# Patient Record
Sex: Male | Born: 1970 | Race: White | Hispanic: No | Marital: Married | Smoking: Never smoker
Health system: Southern US, Community
[De-identification: ages and names within clinical notes are randomized; demographics above are authoritative.]

## PROBLEM LIST (undated history)

## (undated) DIAGNOSIS — F329 Major depressive disorder, single episode, unspecified: Secondary | ICD-10-CM

## (undated) DIAGNOSIS — F411 Generalized anxiety disorder: Secondary | ICD-10-CM

## (undated) DIAGNOSIS — I10 Essential (primary) hypertension: Secondary | ICD-10-CM

## (undated) DIAGNOSIS — E669 Obesity, unspecified: Secondary | ICD-10-CM

## (undated) DIAGNOSIS — E785 Hyperlipidemia, unspecified: Secondary | ICD-10-CM

## (undated) HISTORY — DX: Hyperlipidemia, unspecified: E78.5

## (undated) HISTORY — PX: OTHER SURGICAL HISTORY: SHX169

## (undated) HISTORY — PX: HERNIA REPAIR: SHX51

## (undated) HISTORY — DX: Major depressive disorder, single episode, unspecified: F32.9

## (undated) HISTORY — DX: Generalized anxiety disorder: F41.1

## (undated) HISTORY — DX: Obesity, unspecified: E66.9

## (undated) HISTORY — PX: LITHOTRIPSY: SUR834

## (undated) HISTORY — DX: Essential (primary) hypertension: I10

---

## 1999-11-19 ENCOUNTER — Encounter: Payer: Self-pay | Admitting: Internal Medicine

## 1999-11-19 ENCOUNTER — Ambulatory Visit (HOSPITAL_COMMUNITY): Admission: RE | Admit: 1999-11-19 | Discharge: 1999-11-19 | Payer: Self-pay | Admitting: Internal Medicine

## 1999-11-20 ENCOUNTER — Encounter: Payer: Self-pay | Admitting: Internal Medicine

## 1999-11-20 ENCOUNTER — Ambulatory Visit (HOSPITAL_COMMUNITY): Admission: RE | Admit: 1999-11-20 | Discharge: 1999-11-20 | Payer: Self-pay | Admitting: Internal Medicine

## 2002-10-05 ENCOUNTER — Other Ambulatory Visit: Admission: RE | Admit: 2002-10-05 | Discharge: 2002-10-05 | Payer: Self-pay | Admitting: Dermatology

## 2003-10-19 ENCOUNTER — Ambulatory Visit (HOSPITAL_BASED_OUTPATIENT_CLINIC_OR_DEPARTMENT_OTHER): Admission: RE | Admit: 2003-10-19 | Discharge: 2003-10-19 | Payer: Self-pay | Admitting: Orthopedic Surgery

## 2004-06-05 ENCOUNTER — Ambulatory Visit: Payer: Self-pay | Admitting: Internal Medicine

## 2004-09-19 ENCOUNTER — Ambulatory Visit: Payer: Self-pay | Admitting: Internal Medicine

## 2004-09-29 ENCOUNTER — Ambulatory Visit: Payer: Self-pay | Admitting: Internal Medicine

## 2005-04-22 ENCOUNTER — Ambulatory Visit: Payer: Self-pay | Admitting: Internal Medicine

## 2005-05-11 ENCOUNTER — Ambulatory Visit: Payer: Self-pay | Admitting: Internal Medicine

## 2005-08-09 ENCOUNTER — Emergency Department (HOSPITAL_COMMUNITY): Admission: EM | Admit: 2005-08-09 | Discharge: 2005-08-09 | Payer: Self-pay | Admitting: Emergency Medicine

## 2007-03-31 ENCOUNTER — Encounter: Payer: Self-pay | Admitting: Internal Medicine

## 2007-03-31 ENCOUNTER — Ambulatory Visit: Payer: Self-pay | Admitting: Internal Medicine

## 2007-03-31 DIAGNOSIS — F3289 Other specified depressive episodes: Secondary | ICD-10-CM

## 2007-03-31 DIAGNOSIS — E785 Hyperlipidemia, unspecified: Secondary | ICD-10-CM | POA: Insufficient documentation

## 2007-03-31 DIAGNOSIS — Z9189 Other specified personal risk factors, not elsewhere classified: Secondary | ICD-10-CM | POA: Insufficient documentation

## 2007-03-31 DIAGNOSIS — F32A Depression, unspecified: Secondary | ICD-10-CM | POA: Insufficient documentation

## 2007-03-31 DIAGNOSIS — F329 Major depressive disorder, single episode, unspecified: Secondary | ICD-10-CM

## 2007-03-31 DIAGNOSIS — G4726 Circadian rhythm sleep disorder, shift work type: Secondary | ICD-10-CM

## 2007-03-31 DIAGNOSIS — R5383 Other fatigue: Secondary | ICD-10-CM

## 2007-03-31 DIAGNOSIS — R5381 Other malaise: Secondary | ICD-10-CM | POA: Insufficient documentation

## 2007-03-31 HISTORY — DX: Other specified depressive episodes: F32.89

## 2007-03-31 HISTORY — DX: Hyperlipidemia, unspecified: E78.5

## 2007-03-31 HISTORY — DX: Major depressive disorder, single episode, unspecified: F32.9

## 2008-04-10 ENCOUNTER — Ambulatory Visit: Payer: Self-pay | Admitting: Endocrinology

## 2008-04-10 DIAGNOSIS — I10 Essential (primary) hypertension: Secondary | ICD-10-CM

## 2008-04-10 DIAGNOSIS — R519 Headache, unspecified: Secondary | ICD-10-CM | POA: Insufficient documentation

## 2008-04-10 DIAGNOSIS — R51 Headache: Secondary | ICD-10-CM

## 2008-04-10 HISTORY — DX: Essential (primary) hypertension: I10

## 2008-04-17 ENCOUNTER — Ambulatory Visit: Payer: Self-pay | Admitting: Internal Medicine

## 2008-04-17 DIAGNOSIS — E663 Overweight: Secondary | ICD-10-CM | POA: Insufficient documentation

## 2008-04-17 LAB — CONVERTED CEMR LAB
BUN: 14 mg/dL (ref 6–23)
Calcium: 9.3 mg/dL (ref 8.4–10.5)
Creatinine, Ser: 0.7 mg/dL (ref 0.4–1.5)
GFR calc Af Amer: 163 mL/min
GFR calc non Af Amer: 135 mL/min
Glucose, Bld: 106 mg/dL — ABNORMAL HIGH (ref 70–99)
HDL: 34.6 mg/dL — ABNORMAL LOW (ref >39.0)
Potassium: 3.9 meq/L (ref 3.5–5.1)
Total CHOL/HDL Ratio: 6.3
Triglycerides: 125 mg/dL (ref 0–149)
VLDL: 25 mg/dL (ref 0–40)
Vitamin B-12: 361 pg/mL (ref 211–911)

## 2008-04-19 ENCOUNTER — Encounter: Payer: Self-pay | Admitting: Internal Medicine

## 2008-05-07 ENCOUNTER — Encounter: Payer: Self-pay | Admitting: Internal Medicine

## 2008-05-07 ENCOUNTER — Ambulatory Visit (HOSPITAL_COMMUNITY): Admission: RE | Admit: 2008-05-07 | Discharge: 2008-05-07 | Payer: Self-pay | Admitting: Preventative Medicine

## 2008-05-09 ENCOUNTER — Ambulatory Visit: Payer: Self-pay | Admitting: Internal Medicine

## 2008-05-09 DIAGNOSIS — J984 Other disorders of lung: Secondary | ICD-10-CM | POA: Insufficient documentation

## 2008-05-09 DIAGNOSIS — D3500 Benign neoplasm of unspecified adrenal gland: Secondary | ICD-10-CM | POA: Insufficient documentation

## 2008-05-15 ENCOUNTER — Encounter: Admission: RE | Admit: 2008-05-15 | Discharge: 2008-05-15 | Payer: Self-pay | Admitting: Internal Medicine

## 2008-05-21 ENCOUNTER — Encounter: Payer: Self-pay | Admitting: Internal Medicine

## 2008-06-12 ENCOUNTER — Telehealth (INDEPENDENT_AMBULATORY_CARE_PROVIDER_SITE_OTHER): Payer: Self-pay | Admitting: *Deleted

## 2008-06-13 ENCOUNTER — Ambulatory Visit: Payer: Self-pay | Admitting: Internal Medicine

## 2008-06-13 DIAGNOSIS — R05 Cough: Secondary | ICD-10-CM | POA: Insufficient documentation

## 2008-07-09 ENCOUNTER — Telehealth: Payer: Self-pay | Admitting: Internal Medicine

## 2008-07-10 ENCOUNTER — Ambulatory Visit: Payer: Self-pay | Admitting: Internal Medicine

## 2008-07-10 DIAGNOSIS — J069 Acute upper respiratory infection, unspecified: Secondary | ICD-10-CM | POA: Insufficient documentation

## 2008-07-11 ENCOUNTER — Telehealth: Payer: Self-pay | Admitting: Internal Medicine

## 2008-07-12 ENCOUNTER — Encounter: Payer: Self-pay | Admitting: Internal Medicine

## 2008-07-17 ENCOUNTER — Ambulatory Visit: Payer: Self-pay | Admitting: Internal Medicine

## 2008-07-23 ENCOUNTER — Telehealth: Payer: Self-pay | Admitting: Internal Medicine

## 2008-08-01 ENCOUNTER — Encounter: Payer: Self-pay | Admitting: Internal Medicine

## 2008-11-20 ENCOUNTER — Encounter
Admission: RE | Admit: 2008-11-20 | Discharge: 2008-11-20 | Payer: Self-pay | Admitting: Physical Medicine and Rehabilitation

## 2008-11-20 ENCOUNTER — Encounter: Payer: Self-pay | Admitting: Internal Medicine

## 2008-12-10 ENCOUNTER — Telehealth: Payer: Self-pay | Admitting: Internal Medicine

## 2009-01-14 ENCOUNTER — Encounter: Payer: Self-pay | Admitting: Internal Medicine

## 2009-01-17 ENCOUNTER — Ambulatory Visit: Payer: Self-pay | Admitting: Internal Medicine

## 2009-01-17 ENCOUNTER — Telehealth: Payer: Self-pay | Admitting: Internal Medicine

## 2009-01-21 ENCOUNTER — Telehealth: Payer: Self-pay | Admitting: Internal Medicine

## 2009-02-19 ENCOUNTER — Ambulatory Visit: Payer: Self-pay | Admitting: Internal Medicine

## 2009-02-19 DIAGNOSIS — M25569 Pain in unspecified knee: Secondary | ICD-10-CM | POA: Insufficient documentation

## 2009-02-19 LAB — CONVERTED CEMR LAB
CO2: 29 meq/L (ref 19–32)
Chloride: 102 meq/L (ref 96–112)
Direct LDL: 153.4 mg/dL
Sodium: 139 meq/L (ref 135–145)
Total CHOL/HDL Ratio: 7
VLDL: 24.8 mg/dL (ref 0.0–40.0)

## 2009-02-21 ENCOUNTER — Encounter: Payer: Self-pay | Admitting: Internal Medicine

## 2009-02-23 ENCOUNTER — Encounter: Payer: Self-pay | Admitting: Internal Medicine

## 2009-02-26 ENCOUNTER — Encounter: Payer: Self-pay | Admitting: Internal Medicine

## 2009-02-27 ENCOUNTER — Telehealth: Payer: Self-pay | Admitting: Internal Medicine

## 2009-03-01 ENCOUNTER — Telehealth: Payer: Self-pay | Admitting: Internal Medicine

## 2009-03-05 ENCOUNTER — Telehealth: Payer: Self-pay | Admitting: Internal Medicine

## 2009-04-01 ENCOUNTER — Telehealth: Payer: Self-pay | Admitting: Internal Medicine

## 2009-04-02 ENCOUNTER — Telehealth: Payer: Self-pay | Admitting: Internal Medicine

## 2009-04-03 ENCOUNTER — Ambulatory Visit: Payer: Self-pay | Admitting: Internal Medicine

## 2009-04-03 ENCOUNTER — Encounter: Admission: RE | Admit: 2009-04-03 | Discharge: 2009-04-03 | Payer: Self-pay | Admitting: Internal Medicine

## 2009-04-03 LAB — CONVERTED CEMR LAB
ALT: 34 units/L (ref 0–53)
Albumin: 4 g/dL (ref 3.5–5.2)
Alkaline Phosphatase: 68 units/L (ref 39–117)
Bilirubin, Direct: 0.1 mg/dL (ref 0.0–0.3)
Cholesterol: 166 mg/dL (ref 0–200)
LDL Cholesterol: 112 mg/dL — ABNORMAL HIGH (ref 0–99)
Total Protein: 7.2 g/dL (ref 6.0–8.3)
Triglycerides: 113 mg/dL (ref 0.0–149.0)
VLDL: 22.6 mg/dL (ref 0.0–40.0)

## 2009-04-04 ENCOUNTER — Telehealth: Payer: Self-pay | Admitting: Internal Medicine

## 2009-04-08 ENCOUNTER — Telehealth: Payer: Self-pay | Admitting: Internal Medicine

## 2009-09-10 ENCOUNTER — Encounter (INDEPENDENT_AMBULATORY_CARE_PROVIDER_SITE_OTHER): Payer: Self-pay | Admitting: *Deleted

## 2009-09-10 ENCOUNTER — Ambulatory Visit: Payer: Self-pay | Admitting: Internal Medicine

## 2009-09-10 DIAGNOSIS — R109 Unspecified abdominal pain: Secondary | ICD-10-CM

## 2009-09-13 ENCOUNTER — Ambulatory Visit: Payer: Self-pay | Admitting: Internal Medicine

## 2010-03-01 ENCOUNTER — Emergency Department (HOSPITAL_COMMUNITY)
Admission: EM | Admit: 2010-03-01 | Discharge: 2010-03-01 | Payer: Self-pay | Source: Home / Self Care | Admitting: Family Medicine

## 2010-05-23 ENCOUNTER — Ambulatory Visit: Payer: Self-pay | Admitting: Internal Medicine

## 2010-05-23 ENCOUNTER — Encounter: Payer: Self-pay | Admitting: Internal Medicine

## 2010-05-23 DIAGNOSIS — M2550 Pain in unspecified joint: Secondary | ICD-10-CM

## 2010-05-23 LAB — CONVERTED CEMR LAB
ANA Titer 1: NEGATIVE
Anti Nuclear Antibody(ANA): POSITIVE — AB
Basophils Absolute: 0 10*3/uL (ref 0.0–0.1)
CO2: 30 meq/L (ref 19–32)
CRP, High Sensitivity: 1.79 (ref 0.00–5.00)
Chloride: 104 meq/L (ref 96–112)
Eosinophils Absolute: 0.1 10*3/uL (ref 0.0–0.7)
Hemoglobin: 14.2 g/dL (ref 13.0–17.0)
Lymphocytes Relative: 35.6 % (ref 12.0–46.0)
MCHC: 34.3 g/dL (ref 30.0–36.0)
Monocytes Relative: 9.9 % (ref 3.0–12.0)
Neutro Abs: 4.3 10*3/uL (ref 1.4–7.7)
Neutrophils Relative %: 52.6 % (ref 43.0–77.0)
Potassium: 4.1 meq/L (ref 3.5–5.1)
RBC: 4.65 M/uL (ref 4.22–5.81)
RDW: 13.6 % (ref 11.5–14.6)
Rhuematoid fact SerPl-aCnc: <20 intl units/mL (ref 0–20)
Sed Rate: 13 mm/hr (ref 0–22)

## 2010-05-26 ENCOUNTER — Telehealth: Payer: Self-pay | Admitting: Internal Medicine

## 2010-06-10 ENCOUNTER — Telehealth: Payer: Self-pay | Admitting: Internal Medicine

## 2010-07-15 NOTE — Assessment & Plan Note (Signed)
Summary: PULLED MUSCLE BACK ADD TO ANY MD PER FLAG/CD   Vital Signs:  Patient profile:   40 year old male Height:      75 inches Weight:      259.38 pounds BMI:     32.54 O2 Sat:      98 % on Room air Temp:     98.1 degrees F oral Pulse rate:   93 / minute BP sitting:   102 / 80  (left arm) Cuff size:   regular  Vitals Entered ByZella Ball Ewing (September 10, 2009 2:47 PM)  O2 Flow:  Room air CC: pulled muscle in back/RE   Primary Care Provider:  Illene Regulus, MD  CC:  pulled muscle in back/RE.  History of Present Illness: here with simple severe right lower back strain pain after lifitng heavy object 2 days ago, thought it would get better, but jsut keeps getting worse it seems in severity, located to the right lumbar area, non radicular although he has had right sciatica pain in the past;  no fever, wt loss, night sweats, radicular pain, bowel or bladder changes, LE pain, weakness or numbness, gait problem or falls or other injury.   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 1 By Mouth At Bedtime As Needed 2)  Sumatriptan Succinate 100 Mg Tabs (Sumatriptan Succinate) .... As Needed Headache Nte 2/day 3)  Diovan Hct 160-12.5 Mg Tabs  (Valsartan-Hydrochlorothiazide) .Marland Kitchen.. 1 By Mouth Once Daily 4)  Nexium 40 Mg  Cpdr (Esomeprazole Magnesium) .... By Mouth Daily. Take One Half Hour Before Eating. 5)  Lovastatin 40 Mg Tabs (Lovastatin) .Marland Kitchen.. 1 Once Daily  Current Medications (verified): 1)  Ambien Cr 12.5 Mg Cr-Tabs (Zolpidem Tartrate) .Marland Kitchen.. 1 By Mouth At Bedtime As Needed 2)  Sumatriptan Succinate 100 Mg Tabs (Sumatriptan Succinate) .... As Needed Headache Nte 2/day 3)  Diovan Hct 160-12.5 Mg Tabs (Valsartan-Hydrochlorothiazide) .Marland Kitchen.. 1 By Mouth Once Daily 4)  Nexium 40 Mg  Cpdr (Esomeprazole Magnesium) .... By Mouth Daily. Take One Half Hour Before Eating. 5)  Lovastatin 40 Mg Tabs (Lovastatin) .Marland Kitchen.. 1 Once Daily 6)  Oxycodone Hcl 5 Mg Tabs (Oxycodone Hcl) .Marland Kitchen.. 1 By Mouth Q 6 Hrs As Needed 7)  Flexeril 5 Mg Tabs (Cyclobenzaprine Hcl) .Marland Kitchen.. 1 By Mouth Three Times A Day  Allergies (verified): 1)  ! 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 Never smoker  Risk Factors: Alcohol Use: <1 (04/17/2008) Caffeine  Use: 5+ (04/17/2008) Exercise: no (04/17/2008)  Risk Factors: Smoking Status: never (03/31/2007)  Review of Systems       all otherwise negative per pt -   except pt requests refill on teh ambien cr as this has done well for his sleep disorder  Physical Exam  General:  alert and overweight-appearing.   Head:  normocephalic and atraumatic.   Eyes:  vision grossly intact, pupils equal, and pupils round.   Ears:  R ear normal and L ear normal.   Nose:  no external deformity and no nasal discharge.   Mouth:  no  gingival abnormalities and pharynx pink and moist.   Neck:  supple and no masses.   Lungs:  normal respiratory effort and normal breath sounds.   Heart:  normal rate and regular rhythm.   Msk:  mod to severe right lumbar paravertebral tender spasm Extremities:  no edema, no erythema  Neurologic:  cranial nerves II-XII intact, strength normal in all extremities, and sensation intact to light touch.   Skin:  no rashes.   Psych:  not anxious appearing and not depressed appearing.     Impression & Recommendations:  Problem # 1:  BACK PAIN (ICD-724.5)  has an element of right sciatic stable, today with severe right lumbar paravertebral MSK strain ; off work note given, treat as above, f/u any worsening signs or symptoms   His updated medication list for this problem includes:    Oxycodone Hcl 5 Mg Tabs (Oxycodone hcl) .Marland Kitchen... 1 by mouth q 6 hrs as needed    Flexeril 5 Mg Tabs (Cyclobenzaprine hcl) .Marland Kitchen... 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 1 by mouth once daily  BP today: 102/80 Prior BP: 114/76 (02/19/2009)  Labs Reviewed: K+: 3.6 (02/19/2009) Creat: : 0.8 (02/19/2009)   Chol: 166 (04/03/2009)   HDL: 31.90 (04/03/2009)   LDL: 112 (04/03/2009)   TG: 113.0 (04/03/2009) stable overall by hx and exam, ok to continue meds/tx as is   Complete Medication List: 1)  Ambien Cr 12.5 Mg Cr-tabs (Zolpidem tartrate) .Marland Kitchen.. 1 by mouth at bedtime as needed 2)  Sumatriptan Succinate 100 Mg Tabs (Sumatriptan succinate) .... As needed headache nte 2/day 3)  Diovan Hct 160-12.5 Mg Tabs (Valsartan-hydrochlorothiazide) .Marland Kitchen.. 1 by mouth once daily 4)  Nexium 40 Mg Cpdr (Esomeprazole magnesium) .... By mouth daily. take one half hour before eating. 5)   Lovastatin 40 Mg Tabs (Lovastatin) .Marland Kitchen.. 1 once daily 6)  Oxycodone Hcl 5 Mg Tabs (Oxycodone hcl) .Marland Kitchen.. 1 by mouth q 6 hrs as needed 7)  Flexeril 5 Mg Tabs (Cyclobenzaprine hcl) .Marland Kitchen.. 1 by mouth three times a day  Patient Instructions: 1)  Please take all new medications as prescribed 2)  Continue all previous medications as before this visit  3)  you are given the work note 4)  Please schedule an appointment with your primary doctor as needed Prescriptions: FLEXERIL 5 MG TABS (CYCLOBENZAPRINE HCL) 1 by mouth three times a day  #60 x 0   Entered and Authorized by:   Corwin Levins MD   Signed by:   Corwin Levins MD on 09/10/2009   Method used:   Print then Give to Patient   RxID:  [id number] OXYCODONE HCL 5 MG TABS (OXYCODONE HCL) 1 by mouth q 6 hrs as needed  #40 x 0   Entered and Authorized by:   Corwin Levins MD   Signed by:   Corwin Levins MD on 09/10/2009   Method used:   Print then Give to Patient   RxID:   [id number] AMBIEN CR 12.5 MG CR-TABS (ZOLPIDEM TARTRATE) 1 by mouth at bedtime as needed  #30 x 5   Entered and Authorized by:   Corwin Levins MD   Signed by:   Corwin Levins MD on 09/10/2009   Method used:   Print then Give to Patient   RxID:   [id number]

## 2010-07-15 NOTE — Letter (Signed)
Summary: Out of Work  [facility]  [address] [place], [place] [address]   Phone: [phone]  Fax: [phone]    September 10, 2009   Employee:  MARTEZ WEIAND Dyck    To Whom It May Concern:   For Medical reasons, please excuse the above named employee from work for the following dates:  Start:   09/10/2009  End:   09/17/2009  If you need additional information, please feel free to contact our office.         Sincerely,    Dr. Oliver Barre

## 2010-07-17 NOTE — Assessment & Plan Note (Signed)
Summary: BONE AND MUSCLE PAIN FOR A COUPLE OF MO/NWS  #   Vital Signs:  Patient profile:   40 year old male Height:      75 inches Weight:      257 pounds BMI:     32.24 O2 Sat:      97 % on Room air Temp:     97.4 degrees F oral Pulse rate:   82 / minute BP sitting:   138 / 90  (left arm) Cuff size:   regular  Vitals Entered By: Bill Salinas CMA (May 23, 2010 12:28 PM)  O2 Flow:  Room air CC: pt here with c/o joint pain all over x about 2 months, pt needs refill on lovastatin/ ab   Primary Care Jasmin Winberry:  Illene Regulus, MD  CC:  pt here with c/o joint pain all over x about 2 months and pt needs refill on lovastatin/ ab.  History of Present Illness: Patient presents with a several month history of progressive multi-joint pain that is limiting his activities. He has pain in the hips, wrists, hands. He has had some stiffnes of the hands but no distinct swelling. He has no prior h/o arthirits. He has not had any recent medical illness. He denies redness or swelling about the joints. He has not had an overuse or strain. His pain and discomfort is constant and is starting to limit his acitvities. He has had no fever, chills or other systemic symptoms.   Current Medications (verified): 1)  Ambien Cr 12.5 Mg Cr-Tabs (Zolpidem Tartrate) .Marland Kitchen.. 1 By Mouth At Bedtime As Needed 2)  Sumatriptan Succinate 100 Mg Tabs (Sumatriptan Succinate) .... As Needed Headache Nte 2/day 3)  Diovan Hct 160-12.5 Mg Tabs (Valsartan-Hydrochlorothiazide) .Marland Kitchen.. 1 By Mouth Once Daily 4)  Nexium 40 Mg  Cpdr (Esomeprazole Magnesium) .... By Mouth Daily. Take One Half Hour Before Eating. 5)  Lovastatin 40 Mg Tabs (Lovastatin) .Marland Kitchen.. 1 Once Daily 6)  Oxycodone Hcl 5 Mg Tabs (Oxycodone Hcl) .Marland Kitchen.. 1 By Mouth Q 6 Hrs As Needed 7)  Flexeril 5 Mg Tabs (Cyclobenzaprine Hcl) .Marland Kitchen.. 1 By Mouth Three Times A Day  Allergies (verified): 1)  ! 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    Physical Exam  General:  Well-developed,well-nourished,in no acute distress; alert,appropriate and cooperative throughout examination Head:  Normocephalic and atraumatic without obvious abnormalities. No apparent alopecia or balding. Eyes:  C&S clear Lungs:  normal respiratory effort.   Heart:  normal rate and regular rhythm.   Msk:  joint survey: normal hands, wrists, elbows, shoulders and hips in regard to range of motion NO synovial thickening, no loss ROM, no redness or heat about any of the joint . Pulses:  2+ radial Neurologic:  alert & oriented X3, cranial nerves II-XII intact, strength normal in all extremities, and gait normal.  Skin:  turgor normal, color normal, and no suspicious lesions.   Cervical Nodes:  no anterior cervical adenopathy and no posterior cervical adenopathy.   Psych:  Oriented X3, normally interactive, good eye contact, and not anxious appearing.     Impression & Recommendations:  Problem # 1:  PAIN IN JOINT, MULTIPLE SITES (ICD-719.49)  History of difuse and progressive arthralgias with a normal exam of small, medium and large  joints.  Plan - albs to r/o rheumatologic disease - ANA, ESR, CRP etc with recommendations to follow.   addendum: lab results are negative.  Plan - refer to Dr. Nickola Major  Orders: Rheumatology Referral (Rheumatology)  Complete Medication List: 1)  Ambien Cr 12.5 Mg Cr-tabs (Zolpidem tartrate) .Marland Kitchen.. 1 by mouth at bedtime as needed 2)  Sumatriptan Succinate 100 Mg Tabs (Sumatriptan succinate) .... As needed headache nte 2/day 3)  Diovan Hct 160-12.5 Mg Tabs (Valsartan-hydrochlorothiazide) .Marland Kitchen.. 1 by mouth once daily 4)  Nexium 40 Mg Cpdr (Esomeprazole magnesium) .... By mouth daily. take one half hour before eating. 5)  Lovastatin 40 Mg Tabs (Lovastatin) .Marland Kitchen.. 1 once daily 6)  Oxycodone Hcl 5 Mg Tabs (Oxycodone hcl) .Marland Kitchen.. 1 by mouth q 6 hrs as needed 7)  Flexeril 5 Mg Tabs (Cyclobenzaprine hcl) .Marland Kitchen.. 1 by mouth three times a day  Other Orders: TLB-CBC Platelet - w/Differential (85025-CBCD) TLB-BMP (Basic Metabolic Panel-BMET) (80048-METABOL) TLB-Sedimentation Rate (ESR) (85652-ESR) TLB-CRP-High Sensitivity (C-Reactive Protein) (86140-FCRP) T-Rheumatoid Factor ([address]) T-Antinuclear Antib (ANA) ([address]-91478)  Patient: Robert Lloyd Note: All result statuses are Final unless otherwise noted.  Tests: (1) CBC Platelet w/Diff (CBCD)   White Cell Count          8.2 K/uL                    4.5-10.5   Red Cell Count            4.65 Mil/uL                 4.22-5.81   Hemoglobin                14.2 g/dL                   29.5-62.1   Hematocrit                41.4 %                      39.0-52.0   MCV                       89.0 fl                     78.0-100.0   MCHC                      34.3 g/dL                   30.8-65.7   RDW                       13.6 %                      11.5-14.6   Platelet Count            246.0 K/uL  150.0-400.0   Neutrophil %              52.6 %                      43.0-77.0   Lymphocyte %              35.6 %                      12.0-46.0    Monocyte %                9.9 %                       3.0-12.0   Eosinophils%              1.3 %                       0.0-5.0   Basophils %               0.6 %                       0.0-3.0   Neutrophill Absolute      4.3 K/uL                    1.4-7.7   Lymphocyte Absolute       2.9 K/uL                    0.7-4.0   Monocyte Absolute         0.8 K/uL                    0.1-1.0  Eosinophils, Absolute                             0.1 K/uL                    0.0-0.7   Basophils Absolute        0.0 K/uL                    0.0-0.1  Tests: (2) BMP (METABOL)   Sodium                    141 mEq/L                   135-145   Potassium                 4.1 mEq/L                   3.5-5.1   Chloride                  104 mEq/L                   96-112   Carbon Dioxide            30 mEq/L                    19-32   Glucose                   98 mg/dL  70-99   BUN                       16 mg/dL                    0-45   Creatinine                0.8 mg/dL                   4.0-9.8   Calcium                   9.2 mg/dL                   1.1-91.4   GFR                       119.12 mL/min               >60.00  Tests: (3) Sed Rate (ESR)   Sed Rate                  13 mm/hr                    0-22  Tests: (4) Full Range CRP (FCRP)   CRPH                      1.79 mg/L                   0.00-5.00 Tests: (1) Rheumatoid (RA) Factor (78295)  Rheumatoid (RA) Factor                             < 20 IU/mL                  0-20  Tests: (2) Anti Nuclear Antibody (ANA) Reflex (23900)  Anti Nuclear Antibody (ANA)                             <No Reported Value>         NEGATIVE  Orders Added: 1)  TLB-CBC Platelet - w/Differential [85025-CBCD] 2)  TLB-BMP (Basic Metabolic Panel-BMET) [80048-METABOL] 3)  TLB-Sedimentation Rate (ESR) [85652-ESR] 4)  TLB-CRP-High Sensitivity (C-Reactive Protein) [86140-FCRP] 5)  T-Rheumatoid Factor [62130-86578] 6)  T-Antinuclear Antib (ANA) [46962-95284] 7)   Est. Patient Level III [13244] 8)  Rheumatology Referral [Rheumatology]

## 2010-07-17 NOTE — Progress Notes (Signed)
Summary: RESULTS  Phone Note Outgoing Call   Reason for Call: Discuss lab or test results Summary of Call: Please call patient: all labs were normal. Will refer to Dr. Nickola Major at [facility] for rheumatology consult.  Initial call taken by: Jacques Navy MD,  May 26, 2010 6:34 AM  Follow-up for Phone Call        Pt left vm - req to know results and why he was referred to Rheumatology. [phone] Follow-up by: Lamar Sprinkles, CMA,  May 26, 2010 11:29 AM  Additional Follow-up for Phone Call Additional follow up Details #1::        Spoke w/MD about positive ANA, seems titer is not back. Pt need referral due to ? about why he is in pain. Attempted to call, NO answer. Additional Follow-up by: Lamar Sprinkles, CMA,  May 26, 2010 2:43 PM    Additional Follow-up for Phone Call Additional follow up Details #2::    Results avail -neg ANA titer, left mess to call office back..................Marland KitchenLamar Sprinkles, CMA  May 27, 2010 3:43 PM   Pt informed  Follow-up by: Lamar Sprinkles, CMA,  May 29, 2010 6:09 PM

## 2010-07-17 NOTE — Progress Notes (Signed)
Summary: REQ FOR RX  Phone Note Call from Patient   Summary of Call: Pt has been taking 4 advil two times a day to help w/pain. Pt see rheumatologist in January. He is req rx for to help w/pain as advil has not helped.  Initial call taken by: Lamar Sprinkles, CMA,  June 10, 2010 2:21 PM  Follow-up for Phone Call        tramadol 50mg  2 tabs q 8 hours for pain, #180, refill x 1. May continue ibuprofen with this.  Follow-up by: Jacques Navy MD,  June 10, 2010 5:58 PM  Additional Follow-up for Phone Call Additional follow up Details #1::        Pt informed  Additional Follow-up by: Lamar Sprinkles, CMA,  June 10, 2010 6:07 PM    New/Updated Medications: TRAMADOL HCL 50 MG TABS (TRAMADOL HCL) 1 to 2 tabs every 8 hours as needed pain Prescriptions: TRAMADOL HCL 50 MG TABS (TRAMADOL HCL) 1 to 2 tabs every 8 hours as needed pain  #180 x 1   Entered by:   Lamar Sprinkles, CMA   Authorized by:   Jacques Navy MD   Signed by:   Lamar Sprinkles, CMA on 06/10/2010   Method used:   Electronically to        [phone][facility] (retail)       [address]       [place], [place]  [address]       Ph: [phone] or [phone]       Fax: [phone]   RxID:   [id number]

## 2010-08-01 ENCOUNTER — Telehealth: Payer: Self-pay | Admitting: Internal Medicine

## 2010-08-06 NOTE — Progress Notes (Signed)
Summary: RF Ambien  Phone Note Refill Request Message from:  Pharmacy  Refills Requested: Medication #1:  AMBIEN CR 12.5 MG CR-TABS 1 by mouth at bedtime as needed [phone]  Initial call taken by: Lamar Sprinkles, CMA,  August 01, 2010 6:18 PM  Follow-up for Phone Call        ok as needed  Follow-up by: Jacques Navy MD,  August 01, 2010 6:27 PM    Prescriptions: AMBIEN CR 12.5 MG CR-TABS (ZOLPIDEM TARTRATE) 1 by mouth at bedtime as needed  #90 x 1   Entered by:   Lamar Sprinkles, CMA   Authorized by:   Jacques Navy MD   Signed by:   Lamar Sprinkles, CMA on 08/01/2010   Method used:   Telephoned to ...       [facility]  [address] [phone][facility] (retail)       [address]       [place], [place]  [address]       Ph: [phone]       Fax: [phone]   RxID:   [id number]

## 2010-08-11 ENCOUNTER — Encounter: Payer: Self-pay | Admitting: Internal Medicine

## 2010-08-26 NOTE — Consult Note (Signed)
Summary: [facility]   Imported By: Lennie Odor 08/22/2010 10:38:57  _____________________________________________________________________  External Attachment:    Type:   Image     Comment:   External Document

## 2010-10-31 NOTE — Op Note (Signed)
NAME:  Robert Lloyd, Robert Lloyd                       ACCOUNT NO.:  [id number]   MEDICAL RECORD NO.:  [id number]                   PATIENT TYPE:  AMB   LOCATION:  [facility]                                  FACILITY:  [facility]   PHYSICIAN:  Thera Flake., M.D.             DATE OF BIRTH:  1970-09-01   DATE OF PROCEDURE:  10/19/2003  DATE OF DISCHARGE:                                 OPERATIVE REPORT   PREOPERATIVE DIAGNOSES:  1. Degenerative tearing, anterior inferior labrum.  2. Partial rotator cuff tear (approximately 30%).  3. Impingement.  4. Acromioclavicular joint arthritis.   POSTOPERATIVE DIAGNOSES:  1. Degenerative tearing, anterior inferior labrum.  2. Partial rotator cuff tear (approximately 30%).  3. Impingement.  4. Acromioclavicular joint arthritis.   OPERATION:  1. Arthroscopic acromioplasty.  2. Arthroscopic debridement, torn labrum.  3. Arthroscopic excision, distal clavicle.   SURGEON:  Dyke Brackett, M.D.   ANESTHESIA:  General with a block.   INDICATIONS:  This is a 40 year old with complaint of shoulder pain without  instability, MRI showing possible anterior inferior labral tear with  clinical evidence of impingement and AC arthritis, thought to be amenable to  outpatient surgery.   DESCRIPTION OF PROCEDURE:  Thoroughly examined under anesthesia.  Compared  to the opposite side there was no loss of motion and no instability noted  whatsoever.  He was arthroscoped through a posterolateral and anterior  portal.  The glenohumeral joint surfaces were normal.  There was no frank  Hill-Sachs lesion.  There was a small rent in the labrum.  Really the labrum  could not be detached.  There was some roughness and some degenerative  tearing in the labrum and some small area could be pulled a millimeter or  two off the bone over a distance of about 2 cm; however, again this would  not even hardly admit a probe, and his was debrided.  It was felt that this  was not  responsible for the majority of the patient's pain, particularly  given the fact that he had what I could consider significant partial tendon  tear of the rotator cuff approaching about 30% thickness of the anterior  leading edge of the supraspinatus, biceps tendon anchor intact.  The  undersurface of the cuff was debrided.  The subacromial space was inflamed  and hypertrophied.  There was a thick veil of bursa which was resected.  Aggressive acromioplasty was carried out, resecting about 7 mm of the  anterior leading edge of the acromion with a very prominent distal clavicle,  which was sized a distance of about 1 cm for distal clavicle excision, which  relieved impingement.  It appeared to be there was some impingement from the  [facility] joint and the end of the clavicle as well  as the acromion.  The superior insertion of the cuff showed some  degenerative areas but nothing approaching a full-thickness tear.  The  shoulder drained free of fluid.  Complete bursectomy carried out.  The  portals closed with nylon, a sling applied.  Taken to the recovery room in  stable condition.                                               Thera Flake., M.D.    WDC/MEDQ  D:  10/19/2003  T:  10/20/2003  Job:  [id number]

## 2010-11-21 ENCOUNTER — Ambulatory Visit (INDEPENDENT_AMBULATORY_CARE_PROVIDER_SITE_OTHER): Payer: 59 | Admitting: Internal Medicine

## 2010-11-21 DIAGNOSIS — F341 Dysthymic disorder: Secondary | ICD-10-CM

## 2010-11-21 DIAGNOSIS — J209 Acute bronchitis, unspecified: Secondary | ICD-10-CM

## 2010-11-21 DIAGNOSIS — F329 Major depressive disorder, single episode, unspecified: Secondary | ICD-10-CM

## 2010-11-21 MED ORDER — AMOXICILLIN-POT CLAVULANATE 875-125 MG PO TABS: 1.0000 | ORAL_TABLET | Freq: Two times a day (BID) | ORAL | Status: AC

## 2010-11-21 MED ORDER — LOVASTATIN 40 MG PO TABS: 40.0000 mg | ORAL_TABLET | Freq: Every day | ORAL | Status: DC

## 2010-11-21 MED ORDER — SERTRALINE HCL 50 MG PO TABS: 50.0000 mg | ORAL_TABLET | Freq: Every day | ORAL | Status: DC

## 2010-11-21 NOTE — Patient Instructions (Signed)
Bronchitis with cough and sputum production, fever and feeling BAD. Plan - Augmentin 875mg  twice a day; phenergan with codeine 1 tsp every six hours; tylenol 500mg  three times a day; hydrate; rest.  Situational related anxiety and depression related to your father's illness. Plsan - sertraline 50mg  once a day.

## 2010-11-23 NOTE — Progress Notes (Signed)
  Subjective:    Patient ID: Robert Lloyd, male    DOB: August 07, 1970, 40 y.o.   MRN: [id number]  HPI Robert Lloyd presents with a 1 week history of URI symtoms: low grade fever, sinus pressure, rhinorrhea, myalgias, weakness and feeling bad. He does describe a purulent sputum with a cough.  Patient's father has metastatic renal carcinoma: brain mets and other. His mother is trying to keep him at home. This has been very stressful for Robert Lloyd and he is seeking some relief.           Review of Systems Review of Systems  Constitutional:  Positive for fever, chills, activity change.  HEENT:  Negative for hearing loss, ear pain. Positive for congestion, neck stiffness and postnasal drip. Negative for sore throat or swallowing problems. Negative for dental complaints.   Eyes: Negative for vision loss or change in visual acuity.  Respiratory: Negative for chest tightness and wheezing. Positive for productive cough.  Cardiovascular: Negative for chest pain and palpitation. No decreased exercise tolerance Gastrointestinal: No change in bowel habit. No bloating or gas. No reflux or indigestion Genitourinary: Negative for urgency, frequency, flank pain and difficulty urinating.  Musculoskeletal: Negative for myalgias, back pain, arthralgias and gait problem.  Neurological: Negative for dizziness, tremors, weakness and headaches.  Hematological: Negative for adenopathy.  Psychiatric/Behavioral: Negative for behavioral problems and dysphoric mood.       Objective:   Physical Exam Vitals reviewed Gen'l - heavyset white man in no distress HEENT - mild sinus tenderness to percussion. EACs/TMs normal. Throat clear Pul - no increased work of breathing. Coarse rhonchi on auscultation Cor -RRR Nodes - negative       Assessment & Plan:  1. Bronchitis   Plan - Augmentin 875mg  bid for 7 days           Routine supportive care  2. Situational anxiety and depression related to his  father's illness   Plan - sertraline 50mg  qd with probable increase to 100mg  if he doesn't see real improvement.

## 2010-11-28 DIAGNOSIS — Z0279 Encounter for issue of other medical certificate: Secondary | ICD-10-CM

## 2010-12-12 ENCOUNTER — Ambulatory Visit
Admission: RE | Admit: 2010-12-12 | Discharge: 2010-12-12 | Disposition: A | Discharge status: Home / Self Care | Payer: 59 | Source: Ambulatory Visit | Attending: Emergency Medicine | Admitting: Emergency Medicine

## 2010-12-12 ENCOUNTER — Other Ambulatory Visit: Payer: Self-pay | Admitting: Emergency Medicine

## 2010-12-12 DIAGNOSIS — R1011 Right upper quadrant pain: Secondary | ICD-10-CM

## 2010-12-12 MED ORDER — IOHEXOL 300 MG/ML  SOLN
125.0000 mL | Freq: Once | INTRAMUSCULAR | Status: AC | PRN
  Administered 2010-12-12: 125 mL via INTRAVENOUS

## 2011-03-25 ENCOUNTER — Telehealth: Payer: Self-pay | Admitting: *Deleted

## 2011-03-25 MED ORDER — ZOLPIDEM TARTRATE ER 12.5 MG PO TBCR: 12.5000 mg | EXTENDED_RELEASE_TABLET | Freq: Every evening | ORAL | Status: DC | PRN

## 2011-03-25 NOTE — Telephone Encounter (Signed)
Ok for refill x 5 

## 2011-03-25 NOTE — Telephone Encounter (Signed)
[facility] from [facility] [address] [place] for zolpidem er 12.5 qty 90 sig take one tab by mouth at bedtime please Advise refills

## 2011-11-28 ENCOUNTER — Other Ambulatory Visit: Payer: Self-pay | Admitting: Internal Medicine

## 2012-03-11 ENCOUNTER — Other Ambulatory Visit: Payer: Self-pay | Admitting: Internal Medicine

## 2012-03-14 NOTE — Telephone Encounter (Signed)
PATIENT REQUEST REFILL ON ZOLPIDEM. LAST OV  11/2010. PLEASE ADVISE

## 2012-03-14 NOTE — Telephone Encounter (Signed)
Patient notified of refill on zolpidem faxed to [facility]

## 2012-11-04 ENCOUNTER — Other Ambulatory Visit: Payer: Self-pay | Admitting: Internal Medicine

## 2012-11-04 NOTE — Telephone Encounter (Signed)
Opened in error

## 2012-11-06 ENCOUNTER — Other Ambulatory Visit: Payer: Self-pay | Admitting: Internal Medicine

## 2012-11-08 NOTE — Telephone Encounter (Signed)
Zolpidem called to pharmacy  

## 2012-11-09 ENCOUNTER — Encounter: Payer: Self-pay | Admitting: Internal Medicine

## 2012-11-09 ENCOUNTER — Ambulatory Visit (INDEPENDENT_AMBULATORY_CARE_PROVIDER_SITE_OTHER): Payer: 59 | Admitting: Internal Medicine

## 2012-11-09 VITALS — BP 122/80 | HR 91 | Temp 96.8°F | Ht 75.0 in | Wt 285.1 lb

## 2012-11-09 DIAGNOSIS — F329 Major depressive disorder, single episode, unspecified: Secondary | ICD-10-CM

## 2012-11-09 DIAGNOSIS — F411 Generalized anxiety disorder: Secondary | ICD-10-CM

## 2012-11-09 DIAGNOSIS — I10 Essential (primary) hypertension: Secondary | ICD-10-CM

## 2012-11-09 HISTORY — DX: Generalized anxiety disorder: F41.1

## 2012-11-09 MED ORDER — CLONAZEPAM 0.5 MG PO TABS: 0.5000 mg | ORAL_TABLET | Freq: Two times a day (BID) | ORAL | Status: DC | PRN

## 2012-11-09 MED ORDER — LOSARTAN POTASSIUM 50 MG PO TABS: 50.0000 mg | ORAL_TABLET | Freq: Every day | ORAL | Status: DC

## 2012-11-09 NOTE — Progress Notes (Signed)
  Subjective:    Patient ID: Robert Lloyd, male    DOB: 04/15/1971, 42 y.o.   MRN: [id number]  HPI  Here to f/u; overall doing ok,  Pt denies chest pain, increased sob or doe, wheezing, orthopnea, PND, increased LE swelling, palpitations, dizziness or syncope.  Pt denies polydipsia, polyuria, or low sugar symptoms such as weakness or confusion improved with po intake.  Pt denies new neurological symptoms such as new headache, or facial or extremity weakness or numbness.   Pt states overall good compliance with meds, has been trying to follow lower cholesterol diet, with wt overall stable,  but little exercise however. Bp recently back up at work and home with DBP > 95, and has been on diovan in past, stopped with wt loss, but now regained about 60 lbs over the past 2 yrs after father died, and now mother ill as well. In fact, gonit ot hosp to see mother after this visit.  C/o severe stress and near panic attacks, Denies worsening depressive symptoms, suicidal ideation, or panic; has ongoing anxiety, worries all the time. Past Medical History  Diagnosis Date  . HYPERTENSION 04/10/2008    Qualifier: Diagnosis of  By: Everardo All MD, Cleophas Dunker   . DEPRESSION 03/31/2007    Qualifier: Diagnosis of  By: Jonny Ruiz MD, Len Blalock   . HYPERLIPIDEMIA 03/31/2007    Qualifier: Diagnosis of  By: Jonny Ruiz MD, Len Blalock   . Obesity    Past Surgical History  Procedure Laterality Date  . Hernia repair    . Rotater cuff repair      reports that he has never smoked. He does not have any smokeless tobacco history on file. He reports that he does not drink alcohol or use illicit drugs. family history includes Hypertension in his mother. Allergies  Allergen Reactions  . Sulfonamide Derivatives    Review of Systems  Constitutional: Negative for unexpected weight change, or unusual diaphoresis  HENT: Negative for tinnitus.   Eyes: Negative for photophobia and visual disturbance.  Respiratory: Negative for choking and stridor.    Gastrointestinal: Negative for vomiting and blood in stool.  Genitourinary: Negative for hematuria and decreased urine volume.  Musculoskeletal: Negative for acute joint swelling Skin: Negative for color change and wound.  Neurological: Negative for tremors and numbness other than noted  Psychiatric/Behavioral: Negative for decreased concentration or  hyperactivity.       Objective:   Physical Exam BP 122/80  Pulse 91  Temp(Src) 96.8 F (36 C) (Oral)  Ht 6\' 3"  (1.905 m)  Wt 285 lb 2 oz (129.332 kg)  BMI 35.64 kg/m2  SpO2 96% VS noted,  Constitutional: Pt appears well-developed and well-nourished. Lavella Lemons HENT: Head: NCAT.  Right Ear: External ear normal.  Left Ear: External ear normal.  Eyes: Conjunctivae and EOM are normal. Pupils are equal, round, and reactive to light.  Neck: Normal range of motion. Neck supple.  Cardiovascular: Normal rate and regular rhythm.   Pulmonary/Chest: Effort normal and breath sounds normal.  Neurological: Pt is alert. Not confused  Skin: Skin is warm. No erythema.  Psychiatric: Pt behavior is normal. Thought content normal. mild nervous, not depressed affect    Assessment & Plan:

## 2012-11-09 NOTE — Patient Instructions (Signed)
Please take all new medication as prescribed - the losartan 50 mg per day, and the klonopin as needed for stress Please continue all other medications as before Please return in 4 wks to Dr Debby Bud, or sooner if needed

## 2012-11-09 NOTE — Assessment & Plan Note (Signed)
Mild uncontrolled, for losartan 50 qd, f/u BP at home and next visit 4 wks

## 2012-11-09 NOTE — Assessment & Plan Note (Signed)
Ok for klonopin bid prn,  to f/u any worsening symptoms or concerns  

## 2012-11-09 NOTE — Assessment & Plan Note (Signed)
stable overall by history and exam, recent data reviewed with pt, and pt to continue medical treatment as before,  to f/u any worsening symptoms or concerns Lab Results  Component Value Date   WBC 8.2 05/23/2010   HGB 14.2 05/23/2010   HCT 41.4 05/23/2010   PLT 246.0 05/23/2010   GLUCOSE 98 05/23/2010   CHOL 166 04/03/2009   TRIG 113.0 04/03/2009   HDL 31.90* 04/03/2009   LDLDIRECT 153.4 02/19/2009   LDLCALC 112* 04/03/2009   ALT 34 04/03/2009   AST 25 04/03/2009   NA 141 05/23/2010   K 4.1 05/23/2010   CL 104 05/23/2010   CREATININE 0.8 05/23/2010   BUN 16 05/23/2010   CO2 30 05/23/2010   TSH 0.83 04/17/2008

## 2012-12-14 ENCOUNTER — Emergency Department (HOSPITAL_COMMUNITY)
Admission: EM | Admit: 2012-12-14 | Discharge: 2012-12-14 | Disposition: A | Discharge status: Home / Self Care | Payer: 59 | Attending: Emergency Medicine | Admitting: Emergency Medicine

## 2012-12-14 ENCOUNTER — Encounter (HOSPITAL_COMMUNITY): Payer: Self-pay | Admitting: Emergency Medicine

## 2012-12-14 DIAGNOSIS — Z79899 Other long term (current) drug therapy: Secondary | ICD-10-CM | POA: Insufficient documentation

## 2012-12-14 DIAGNOSIS — R109 Unspecified abdominal pain: Secondary | ICD-10-CM | POA: Insufficient documentation

## 2012-12-14 DIAGNOSIS — E669 Obesity, unspecified: Secondary | ICD-10-CM | POA: Insufficient documentation

## 2012-12-14 DIAGNOSIS — R350 Frequency of micturition: Secondary | ICD-10-CM | POA: Insufficient documentation

## 2012-12-14 DIAGNOSIS — F329 Major depressive disorder, single episode, unspecified: Secondary | ICD-10-CM | POA: Insufficient documentation

## 2012-12-14 DIAGNOSIS — F3289 Other specified depressive episodes: Secondary | ICD-10-CM | POA: Insufficient documentation

## 2012-12-14 DIAGNOSIS — F411 Generalized anxiety disorder: Secondary | ICD-10-CM | POA: Insufficient documentation

## 2012-12-14 DIAGNOSIS — E785 Hyperlipidemia, unspecified: Secondary | ICD-10-CM | POA: Insufficient documentation

## 2012-12-14 DIAGNOSIS — R3915 Urgency of urination: Secondary | ICD-10-CM | POA: Insufficient documentation

## 2012-12-14 DIAGNOSIS — R110 Nausea: Secondary | ICD-10-CM | POA: Insufficient documentation

## 2012-12-14 DIAGNOSIS — N509 Disorder of male genital organs, unspecified: Secondary | ICD-10-CM | POA: Insufficient documentation

## 2012-12-14 DIAGNOSIS — I10 Essential (primary) hypertension: Secondary | ICD-10-CM | POA: Insufficient documentation

## 2012-12-14 DIAGNOSIS — R339 Retention of urine, unspecified: Secondary | ICD-10-CM | POA: Insufficient documentation

## 2012-12-14 DIAGNOSIS — R319 Hematuria, unspecified: Secondary | ICD-10-CM

## 2012-12-14 LAB — CBC WITH DIFFERENTIAL/PLATELET
Basophils Relative: 1 % (ref 0–1)
Eosinophils Absolute: 0.1 10*3/uL (ref 0.0–0.7)
Eosinophils Relative: 2 % (ref 0–5)
Hemoglobin: 13.7 g/dL (ref 13.0–17.0)
Lymphs Abs: 2.5 10*3/uL (ref 0.7–4.0)
MCH: 29.7 pg (ref 26.0–34.0)
MCHC: 33.3 g/dL (ref 30.0–36.0)
MCV: 89.2 fL (ref 78.0–100.0)
Monocytes Relative: 8 % (ref 3–12)
Neutro Abs: 4.5 10*3/uL (ref 1.7–7.7)
Neutrophils Relative %: 57 % (ref 43–77)
Platelets: 216 10*3/uL (ref 150–400)
RBC: 4.61 MIL/uL (ref 4.22–5.81)
WBC: 7.8 10*3/uL (ref 4.0–10.5)

## 2012-12-14 LAB — BASIC METABOLIC PANEL
BUN: 16 mg/dL (ref 6–23)
CO2: 29 mEq/L (ref 19–32)
Calcium: 9.6 mg/dL (ref 8.4–10.5)
GFR calc Af Amer: >90 mL/min (ref >90)
GFR calc non Af Amer: >90 mL/min (ref >90)
Glucose, Bld: 115 mg/dL — ABNORMAL HIGH (ref 70–99)
Sodium: 142 mEq/L (ref 135–145)

## 2012-12-14 LAB — URINALYSIS, ROUTINE W REFLEX MICROSCOPIC
Bilirubin Urine: NEGATIVE
Ketones, ur: NEGATIVE mg/dL
Nitrite: NEGATIVE
Protein, ur: 30 mg/dL — AB
Urobilinogen, UA: 0.2 mg/dL (ref 0.0–1.0)

## 2012-12-14 MED ORDER — OXYCODONE-ACETAMINOPHEN 5-325 MG PO TABS
1.0000 | ORAL_TABLET | ORAL | Status: DC | PRN
Start: 2012-12-14 — End: 2013-08-11

## 2012-12-14 MED ORDER — ONDANSETRON HCL 4 MG/2ML IJ SOLN
4.0000 mg | Freq: Once | INTRAMUSCULAR | Status: AC
  Administered 2012-12-14: 4 mg via INTRAVENOUS
  Filled 2012-12-14: qty 2

## 2012-12-14 MED ORDER — KETOROLAC TROMETHAMINE 30 MG/ML IJ SOLN
15.0000 mg | Freq: Once | INTRAMUSCULAR | Status: AC
  Administered 2012-12-14: 15 mg via INTRAVENOUS
  Filled 2012-12-14: qty 1

## 2012-12-14 MED ORDER — HYDROMORPHONE HCL PF 1 MG/ML IJ SOLN
1.0000 mg | Freq: Once | INTRAMUSCULAR | Status: AC
  Administered 2012-12-14: 1 mg via INTRAVENOUS
  Filled 2012-12-14: qty 1

## 2012-12-14 MED ORDER — TAMSULOSIN HCL 0.4 MG PO CAPS: 0.4000 mg | ORAL_CAPSULE | Freq: Every evening | ORAL | Status: DC

## 2012-12-14 NOTE — ED Provider Notes (Signed)
History    CSN: [id number] Arrival date & time 12/14/12  0209  First MD Initiated Contact with Patient 12/14/12 [phone]     Chief Complaint  Patient presents with  . Urinary Retention  . Flank Pain    Patient is a 42 y.o. male presenting with flank pain. The history is provided by the patient.  Flank Pain This is a new problem. The current episode started 1 to 2 hours ago. The problem occurs constantly. The problem has been gradually worsening. Associated symptoms include abdominal pain. Pertinent negatives include no chest pain and no shortness of breath. Exacerbated by: palpation. Nothing relieves the symptoms. He has tried rest for the symptoms. The treatment provided no relief.  pt reports recent urinary frequency and minimal left flank pain.  He reports tonight he had significant worsening of his left flank pain and pain also moves into his groin No fever reported No cp/sob No focal weakness reported He reports distant h/o kidney stone that did not require urologic intervention This feels similar to prior episode  Past Medical History  Diagnosis Date  . HYPERTENSION 04/10/2008    Qualifier: Diagnosis of  By: Everardo All MD, Cleophas Dunker   . DEPRESSION 03/31/2007    Qualifier: Diagnosis of  By: Jonny Ruiz MD, Len Blalock   . HYPERLIPIDEMIA 03/31/2007    Qualifier: Diagnosis of  By: Jonny Ruiz MD, Len Blalock   . Obesity   . Anxiety state, unspecified 11/09/2012   Past Surgical History  Procedure Laterality Date  . Hernia repair    . Rotater cuff repair     Family History  Problem Relation Age of Onset  . Hypertension Mother    History  Substance Use Topics  . Smoking status: Never Smoker   . Smokeless tobacco: Not on file  . Alcohol Use: Yes    Review of Systems  Constitutional: Negative for fever.  Respiratory: Negative for shortness of breath.   Cardiovascular: Negative for chest pain.  Gastrointestinal: Positive for nausea and abdominal pain.  Genitourinary: Positive for urgency, flank  pain and testicular pain. Negative for scrotal swelling.  Neurological: Negative for weakness.  All other systems reviewed and are negative.    Allergies  Sulfonamide derivatives  Home Medications   Current Outpatient Rx  Name  Route  Sig  Dispense  Refill  . clonazePAM (KLONOPIN) 0.5 MG tablet   Oral   Take 1 tablet (0.5 mg total) by mouth 2 (two) times daily as needed for anxiety.   60 tablet   0   . losartan (COZAAR) 50 MG tablet   Oral   Take 1 tablet (50 mg total) by mouth daily.   90 tablet   3   . lovastatin (MEVACOR) 40 MG tablet      TAKE 1 TABLET BY MOUTH AT BEDTIME   30 tablet   7   . zolpidem (AMBIEN CR) 12.5 MG CR tablet      TAKE 1 TABLET BY MOUTH AT BEDTIME AS NEEDED   90 tablet   0   . EXPIRED: sertraline (ZOLOFT) 50 MG tablet   Oral   Take 1 tablet (50 mg total) by mouth daily.   30 tablet   6    BP 132/86  Pulse 93  Temp(Src) 97.8 F (36.6 C) (Oral)  Resp 20  Ht 6\' 3"  (1.905 m)  Wt 275 lb (124.739 kg)  BMI 34.37 kg/m2  SpO2 94% Physical Exam CONSTITUTIONAL: Well developed/well nourished HEAD: Normocephalic/atraumatic EYES: EOMI/PERRL ENMT: Mucous membranes  moist NECK: supple no meningeal signs SPINE:entire spine nontender, No bruising/crepitance/stepoffs noted to spine CV: S1/S2 noted, no murmurs/rubs/gallops noted LUNGS: Lungs are clear to auscultation bilaterally, no apparent distress ABDOMEN: soft, nontender, no rebound or guarding OZ:HYQM cva tenderness. No inguinal hernia noted.  No scrotal tenderness/edema noted.  No testicular tenderness noted NEURO: Pt is awake/alert, moves all extremitiesx4 EXTREMITIES: pulses normal, full ROM SKIN: warm, color normal PSYCH: no abnormalities of mood noted  ED Course  Procedures Labs Reviewed  URINALYSIS, ROUTINE W REFLEX MICROSCOPIC - Abnormal; Notable for the following:    Specific Gravity, Urine >1.030 (*)    Hgb urine dipstick LARGE (*)    Protein, ur 30 (*)    All other  components within normal limits  BASIC METABOLIC PANEL - Abnormal; Notable for the following:    Glucose, Bld 115 (*)    All other components within normal limits  CBC WITH DIFFERENTIAL  URINE MICROSCOPIC-ADD ON     MDM  Nursing notes including past medical history and social history reviewed and considered in documentation Labs/vital reviewed and considered   Pt present with left flank pain and recent urinary frequency/urgency.  He has hematuria.  Suspect ureteral stone, however he is well appearing and already improving.  He has had CT previously that demonstrated kidney stone.  I advised that given history/exam and that he is improving that CT is not mandatory for evaluation.  He feels comfortable with this plan  4:30 AM Pt improved no pain at this time.  He feels  Comfortable for d/c home.  We discussed need for f/u.    Joya Gaskins, MD 12/14/12 [phone]

## 2012-12-14 NOTE — ED Notes (Signed)
Patient c/o urinary retention x 1 week; states left flank pain has been hurting for several days, but tonight pain moved into groin.  Denies nausea or vomiting.

## 2012-12-14 NOTE — ED Notes (Signed)
C/o urgency / frequency - need to void but does not go but a little at a time

## 2012-12-14 NOTE — ED Notes (Signed)
Patient states he gave up Diet Mt Dews about 2 weeks ago and began drinking just water.  Has felt some twinges of similar discomfort for past few weeks but tonight pain severe and cannot void more than a small amount at a time

## 2012-12-14 NOTE — ED Notes (Signed)
Reports pain tonight is like the pain he had with "kidney stone 10 years ago"

## 2013-02-20 ENCOUNTER — Other Ambulatory Visit: Payer: Self-pay | Admitting: Internal Medicine

## 2013-02-20 NOTE — Telephone Encounter (Signed)
Ok to refill? Last OV 5.28.14 Last filled 5.28.14

## 2013-02-21 NOTE — Telephone Encounter (Signed)
Done hardcopy to robin  

## 2013-02-21 NOTE — Telephone Encounter (Signed)
Faxed [facility]copy to [facility] [place]  

## 2013-03-23 ENCOUNTER — Ambulatory Visit (INDEPENDENT_AMBULATORY_CARE_PROVIDER_SITE_OTHER): Payer: 59 | Admitting: Podiatry

## 2013-03-23 ENCOUNTER — Encounter: Payer: Self-pay | Admitting: Podiatry

## 2013-03-23 VITALS — BP 140/86 | HR 90 | Temp 97.6°F | Resp 12 | Ht 75.0 in | Wt 280.0 lb

## 2013-03-23 DIAGNOSIS — R609 Edema, unspecified: Secondary | ICD-10-CM

## 2013-03-23 DIAGNOSIS — M779 Enthesopathy, unspecified: Secondary | ICD-10-CM

## 2013-03-23 MED ORDER — MELOXICAM 15 MG PO TABS
15.0000 mg | ORAL_TABLET | Freq: Every day | ORAL | Status: DC
Start: 2013-03-23 — End: 2013-08-11

## 2013-03-23 NOTE — Progress Notes (Signed)
°  Subjective:    Patient ID: Robert Lloyd, male    DOB: 1971-05-24, 42 y.o.   MRN: [id number]  HPI    Review of Systems  Constitutional: Negative.   HENT: Negative.   Eyes: Negative.   Respiratory: Negative.   Cardiovascular: Negative.   Gastrointestinal: Negative.   Endocrine: Negative.   Genitourinary: Negative.   Skin: Negative.   Allergic/Immunologic: Negative.   Neurological: Positive for numbness.  Hematological: Negative.   Psychiatric/Behavioral: Negative.        Objective:   Physical Exam        Assessment & Plan:

## 2013-03-23 NOTE — Progress Notes (Signed)
Subjective:     Patient ID: Robert Lloyd, male   DOB: [birth year], 42 y.o.   MRN: [id number]  HPI patient states his heel feels fine he is getting some pain in his forefoot. Points 2 around the second and third metatarsophalangeal joint.   Review of Systems  All other systems reviewed and are negative.       Objective:   Physical Exam  Constitutional: He is oriented to person, place, and time.  Cardiovascular: Intact distal pulses.   Musculoskeletal: Normal range of motion.  Neurological: He is oriented to person, place, and time.  Skin: Skin is warm.   The incision site on the heel are doing well. No pain noted upon palpation of the calcaneal tendon junction. Patient does have discomfort in the second and third metatarsophalangeal joints with mild fluid accumulation.    Assessment:     Well-healing plantar fascial incision left. Mild change in gait leading to capsulitis second and third MPJ left.    Plan:     H&P performed. Advised on anti-inflammatory Mobic 15 mg daily. Ice therapy. Should resolve reappoint if symptoms persist in the forefoot.

## 2013-03-23 NOTE — Patient Instructions (Signed)
Call if not better in 6 weeks.  

## 2013-04-12 ENCOUNTER — Ambulatory Visit (INDEPENDENT_AMBULATORY_CARE_PROVIDER_SITE_OTHER): Payer: 59 | Admitting: Podiatry

## 2013-04-12 ENCOUNTER — Encounter: Payer: Self-pay | Admitting: Podiatry

## 2013-04-12 ENCOUNTER — Other Ambulatory Visit: Payer: Self-pay | Admitting: Internal Medicine

## 2013-04-12 VITALS — BP 134/92 | HR 90 | Resp 20

## 2013-04-12 DIAGNOSIS — M779 Enthesopathy, unspecified: Secondary | ICD-10-CM

## 2013-04-12 DIAGNOSIS — M7750 Other enthesopathy of unspecified foot: Secondary | ICD-10-CM

## 2013-04-12 MED ORDER — TRIAMCINOLONE ACETONIDE 10 MG/ML IJ SUSP
5.0000 mg | Freq: Once | INTRAMUSCULAR | Status: AC
  Administered 2013-04-12: 5 mg via INTRA_ARTICULAR

## 2013-04-12 NOTE — Progress Notes (Signed)
Subjective:     Patient ID: Robert Lloyd, male   DOB: Jun 05, 1971, 42 y.o.   MRN: [id number]  HPI patient presents stating my heel is good but the outside of my foot and ankle has been very sore. Patient states he feels like he does not move it well   Review of Systems     Objective:   Physical Exam  Constitutional: He is oriented to person, place, and time.  Cardiovascular: Intact distal pulses.   Musculoskeletal: Normal range of motion.  Neurological: He is oriented to person, place, and time.  Skin: Skin is warm.   patient has discomfort in the left sinus tarsi and in the peroneal brevis insertion lateral foot. Heel is doing very well with minimal discomfort    Assessment:     Probable compensation of the foot secondary to endoscopic release heel 8 weeks ago    Plan:     Explained condition and did two separate distinct injections one of the capsule of the sinus tarsi 3 mg Kenalog 5 mg Xylocaine and the other to the peroneal insertion 3 mg Kenalog 5 mg Xylocaine Marcaine mixture

## 2013-04-12 NOTE — Patient Instructions (Signed)
Be easy on your foot for the rest of this week

## 2013-04-19 ENCOUNTER — Other Ambulatory Visit: Payer: Self-pay | Admitting: Internal Medicine

## 2013-04-20 NOTE — Telephone Encounter (Signed)
Clonazepam called to pharmacy  

## 2013-05-08 ENCOUNTER — Ambulatory Visit (INDEPENDENT_AMBULATORY_CARE_PROVIDER_SITE_OTHER): Payer: 59 | Admitting: Podiatry

## 2013-05-08 DIAGNOSIS — M722 Plantar fascial fibromatosis: Secondary | ICD-10-CM

## 2013-05-24 NOTE — Progress Notes (Signed)
Subjective:     Patient ID: Robert Lloyd, male   DOB: 11-06-1970, 42 y.o.   MRN: [id number]  HPInot seen this day   Review of Systems     Objective:   Physical Exam     Assessment:     No notes    Plan:     Call to reappoint

## 2013-08-11 ENCOUNTER — Emergency Department (HOSPITAL_COMMUNITY)
Admission: EM | Admit: 2013-08-11 | Discharge: 2013-08-11 | Disposition: A | Discharge status: Home / Self Care | Payer: Worker's Compensation | Attending: Emergency Medicine | Admitting: Emergency Medicine

## 2013-08-11 ENCOUNTER — Encounter (HOSPITAL_COMMUNITY): Payer: Self-pay | Admitting: Emergency Medicine

## 2013-08-11 ENCOUNTER — Emergency Department (HOSPITAL_COMMUNITY): Payer: Worker's Compensation

## 2013-08-11 ENCOUNTER — Other Ambulatory Visit: Payer: Self-pay | Admitting: Internal Medicine

## 2013-08-11 DIAGNOSIS — M545 Low back pain, unspecified: Secondary | ICD-10-CM | POA: Diagnosis present

## 2013-08-11 DIAGNOSIS — I10 Essential (primary) hypertension: Secondary | ICD-10-CM | POA: Diagnosis not present

## 2013-08-11 DIAGNOSIS — S79929A Unspecified injury of unspecified thigh, initial encounter: Principal | ICD-10-CM

## 2013-08-11 DIAGNOSIS — E785 Hyperlipidemia, unspecified: Secondary | ICD-10-CM | POA: Diagnosis not present

## 2013-08-11 DIAGNOSIS — Z882 Allergy status to sulfonamides status: Secondary | ICD-10-CM | POA: Insufficient documentation

## 2013-08-11 DIAGNOSIS — Y939 Activity, unspecified: Secondary | ICD-10-CM | POA: Diagnosis not present

## 2013-08-11 DIAGNOSIS — Y9289 Other specified places as the place of occurrence of the external cause: Secondary | ICD-10-CM | POA: Diagnosis not present

## 2013-08-11 DIAGNOSIS — Z79899 Other long term (current) drug therapy: Secondary | ICD-10-CM | POA: Diagnosis not present

## 2013-08-11 DIAGNOSIS — F3289 Other specified depressive episodes: Secondary | ICD-10-CM | POA: Diagnosis not present

## 2013-08-11 DIAGNOSIS — W010XXA Fall on same level from slipping, tripping and stumbling without subsequent striking against object, initial encounter: Secondary | ICD-10-CM | POA: Insufficient documentation

## 2013-08-11 DIAGNOSIS — F329 Major depressive disorder, single episode, unspecified: Secondary | ICD-10-CM | POA: Diagnosis not present

## 2013-08-11 DIAGNOSIS — E669 Obesity, unspecified: Secondary | ICD-10-CM | POA: Diagnosis not present

## 2013-08-11 DIAGNOSIS — W19XXXA Unspecified fall, initial encounter: Secondary | ICD-10-CM

## 2013-08-11 DIAGNOSIS — F411 Generalized anxiety disorder: Secondary | ICD-10-CM | POA: Diagnosis not present

## 2013-08-11 DIAGNOSIS — S79912A Unspecified injury of left hip, initial encounter: Secondary | ICD-10-CM

## 2013-08-11 DIAGNOSIS — S79919A Unspecified injury of unspecified hip, initial encounter: Secondary | ICD-10-CM | POA: Insufficient documentation

## 2013-08-11 MED ORDER — OXYCODONE-ACETAMINOPHEN 5-325 MG PO TABS
2.0000 | ORAL_TABLET | Freq: Once | ORAL | Status: AC
  Administered 2013-08-11: 2 via ORAL
  Filled 2013-08-11: qty 2

## 2013-08-11 MED ORDER — DIAZEPAM 5 MG PO TABS
5.0000 mg | ORAL_TABLET | Freq: Once | ORAL | Status: AC
  Administered 2013-08-11: 5 mg via ORAL
  Filled 2013-08-11: qty 1

## 2013-08-11 MED ORDER — IBUPROFEN 800 MG PO TABS
800.0000 mg | ORAL_TABLET | Freq: Once | ORAL | Status: AC
  Administered 2013-08-11: 800 mg via ORAL
  Filled 2013-08-11: qty 1

## 2013-08-11 MED ORDER — OXYCODONE-ACETAMINOPHEN 5-325 MG PO TABS: 2.0000 | ORAL_TABLET | ORAL | Status: DC | PRN

## 2013-08-11 MED ORDER — METHOCARBAMOL 500 MG PO TABS: 500.0000 mg | ORAL_TABLET | Freq: Two times a day (BID) | ORAL | Status: DC

## 2013-08-11 NOTE — ED Notes (Signed)
Patient aware we are going to xray his hip and give more pain med.

## 2013-08-11 NOTE — ED Notes (Signed)
Able to ambulate with out  Difficulty. States he still had soreness in his left hip that is worse with movement.

## 2013-08-11 NOTE — Discharge Instructions (Signed)

## 2013-08-11 NOTE — ED Provider Notes (Signed)
CSN: [id number]     Arrival date & time 08/11/13  0813 History   First MD Initiated Contact with Patient 08/11/13 0813     No chief complaint on file.    (Consider location/radiation/quality/duration/timing/severity/associated sxs/prior Treatment) HPI  43 year old obese male presents for evaluation of a recent fall. Patient states he was trying to get out of his car this morning, slipped on ice, fell backward. he was able to grab onto the door to prevent from hitting the ground. He felt that he may have twisted his back in the process. He denies hitting his head or loss of consciousness. He reports pain is sharp, throbbing with muscle spasm primarily to his left lower back, nonradiating, he was able to ambulate afterward. He was evaluated by his boss who called EMS to bring pt to the ER for further evaluation. He reported remote history of bulging disc in his low back 20 years ago but has not had any significant back discomfort since. Denies any precipitating symptoms prior to the fall. Denies any new numbness or weakness. No specific treatment tried.  Past Medical History  Diagnosis Date  . HYPERTENSION 04/10/2008    Qualifier: Diagnosis of  By: Loanne Drilling MD, Jacelyn Pi   . DEPRESSION 03/31/2007    Qualifier: Diagnosis of  By: Jenny Reichmann MD, Hunt Oris   . HYPERLIPIDEMIA 03/31/2007    Qualifier: Diagnosis of  By: Jenny Reichmann MD, Hunt Oris   . Obesity   . Anxiety state, unspecified 11/09/2012   Past Surgical History  Procedure Laterality Date  . Hernia repair    . Rotater cuff repair     Family History  Problem Relation Age of Onset  . Hypertension Mother    History  Substance Use Topics  . Smoking status: Never Smoker   . Smokeless tobacco: Not on file  . Alcohol Use: Yes     Comment: occasionally    Review of Systems  Constitutional: Negative for fever.  Musculoskeletal: Positive for back pain.  Skin: Negative for rash and wound.  Neurological: Negative for numbness.      Allergies   Sulfonamide derivatives  Home Medications   Current Outpatient Rx  Name  Route  Sig  Dispense  Refill  . clonazePAM (KLONOPIN) 0.5 MG tablet      TAKE 1 TABLET TWICE A DAY AS NEEDED   60 tablet   0   . HYDROcodone-acetaminophen (NORCO) 10-325 MG per tablet               . losartan (COZAAR) 50 MG tablet   Oral   Take 1 tablet (50 mg total) by mouth daily.   90 tablet   3   . lovastatin (MEVACOR) 40 MG tablet      TAKE 1 TABLET BY MOUTH AT BEDTIME   30 tablet   7   . meloxicam (MOBIC) 15 MG tablet   Oral   Take 1 tablet (15 mg total) by mouth daily.   30 tablet   2   . oxyCODONE-acetaminophen (PERCOCET/ROXICET) 5-325 MG per tablet   Oral   Take 1 tablet by mouth every 4 (four) hours as needed for pain.   15 tablet   0   . EXPIRED: sertraline (ZOLOFT) 50 MG tablet   Oral   Take 1 tablet (50 mg total) by mouth daily.   30 tablet   6   . tamsulosin (FLOMAX) 0.4 MG CAPS   Oral   Take 1 capsule (0.4 mg total) by mouth Nightly.  7 capsule   0   . zolpidem (AMBIEN CR) 12.5 MG CR tablet      TAKE 1 TABLET AT BEDTIME AS NEEDED   90 tablet   0    There were no vitals taken for this visit. Physical Exam  Constitutional: He appears well-developed and well-nourished. No distress.  Obese.  HENT:  Head: Atraumatic.  Eyes: Conjunctivae are normal.  Neck: Normal range of motion. Neck supple.  Abdominal: Soft. There is no tenderness.  Musculoskeletal: He exhibits tenderness (L paralumbar tenderness to palpation, worsening with back rotation/flexion/extension.  No overlying skin changes.  ).  No significant midline spine tenderness, crepitus, step off.  Normal hip flexion/external/internal rotation bilat.      Neurological: He is alert.  Slow gait but able to ambulate.    Skin: No rash noted.  Psychiatric: He has a normal mood and affect.    ED Course  Procedures (including critical care time)  8:25 AM Pt with mechanical injury to L lower back, not  involving spinal column.  No red flags, able to ambulate.  Fall from standing height, likely musculoskeletal strain, doubt acute bony injury.  Discussed option of xray, pt opted no xray.  Ibuprofen and muscle relaxant given.    9:46 AM X-ray of left without acute fractures or dislocation. Will discharge patient with RICE therapy, pain medication, muscle relaxant, return precautions.  Labs Review Labs Reviewed - No data to display Imaging Review Dg Hip Complete Left  08/11/2013   CLINICAL DATA:  Fall  EXAM: LEFT HIP - COMPLETE 2+ VIEW  COMPARISON:  None.  FINDINGS: There is no evidence of hip fracture or dislocation. There is no evidence of arthropathy or other focal bone abnormality.  IMPRESSION: Negative.   Electronically Signed   By: Franchot Gallo M.D.   On: 08/11/2013 09:34    EKG Interpretation  None  MDM   Final diagnoses:  Fall from standing  Injury of left hip    BP 146/93  Pulse 102  Temp(Src) 97.1 F (36.2 C)  Resp 20  SpO2 97%  I have reviewed nursing notes and vital signs. I personally reviewed the imaging tests through PACS system  I reviewed available ER/hospitalization records thought the EMR     Domenic Moras, [place] 08/11/13 [id number]

## 2013-08-11 NOTE — ED Notes (Signed)
Patient states he was getting out of his car and slipped on the ice , landing on back. C/o pain in left lower back and posterior hip area that is hurting.

## 2013-08-14 NOTE — ED Provider Notes (Signed)
Medical screening examination/treatment/procedure(s) were performed by non-physician practitioner and as supervising physician I was immediately available for consultation/collaboration.   EKG Interpretation None        Mervin Kung, MD 08/14/13 1011

## 2013-08-21 ENCOUNTER — Ambulatory Visit: Payer: Self-pay

## 2013-08-21 ENCOUNTER — Other Ambulatory Visit: Payer: Self-pay | Admitting: Occupational Medicine

## 2013-08-21 DIAGNOSIS — M549 Dorsalgia, unspecified: Secondary | ICD-10-CM

## 2013-09-11 ENCOUNTER — Other Ambulatory Visit: Payer: Self-pay | Admitting: Internal Medicine

## 2013-09-11 NOTE — Telephone Encounter (Signed)
Do you want to approve this?  

## 2013-10-05 ENCOUNTER — Ambulatory Visit: Payer: Self-pay

## 2013-10-05 ENCOUNTER — Other Ambulatory Visit: Payer: Self-pay | Admitting: Occupational Medicine

## 2013-10-05 DIAGNOSIS — M25532 Pain in left wrist: Secondary | ICD-10-CM

## 2014-03-02 ENCOUNTER — Telehealth: Payer: Self-pay | Admitting: Internal Medicine

## 2014-03-02 NOTE — Telephone Encounter (Signed)
Got scheduled for next week  °

## 2014-03-02 NOTE — Telephone Encounter (Signed)
Former [facility] patient.  He states he has seen you a few times and would like you to take him on as his PCP.  Please advise.

## 2014-03-02 NOTE — Telephone Encounter (Signed)
Ok with me 

## 2014-03-09 ENCOUNTER — Encounter: Payer: Self-pay | Admitting: Internal Medicine

## 2014-03-09 ENCOUNTER — Ambulatory Visit (INDEPENDENT_AMBULATORY_CARE_PROVIDER_SITE_OTHER): Payer: 59 | Admitting: Internal Medicine

## 2014-03-09 VITALS — BP 132/90 | HR 80 | Temp 98.2°F | Ht 75.0 in | Wt 257.0 lb

## 2014-03-09 DIAGNOSIS — R7309 Other abnormal glucose: Secondary | ICD-10-CM

## 2014-03-09 DIAGNOSIS — I10 Essential (primary) hypertension: Secondary | ICD-10-CM

## 2014-03-09 DIAGNOSIS — R7302 Impaired glucose tolerance (oral): Secondary | ICD-10-CM

## 2014-03-09 DIAGNOSIS — Z23 Encounter for immunization: Secondary | ICD-10-CM

## 2014-03-09 DIAGNOSIS — Z0000 Encounter for general adult medical examination without abnormal findings: Secondary | ICD-10-CM | POA: Insufficient documentation

## 2014-03-09 DIAGNOSIS — Z0001 Encounter for general adult medical examination with abnormal findings: Secondary | ICD-10-CM | POA: Insufficient documentation

## 2014-03-09 DIAGNOSIS — R7303 Prediabetes: Secondary | ICD-10-CM | POA: Insufficient documentation

## 2014-03-09 MED ORDER — ZOLPIDEM TARTRATE ER 12.5 MG PO TBCR: 12.5000 mg | EXTENDED_RELEASE_TABLET | Freq: Every evening | ORAL | Status: DC | PRN

## 2014-03-09 NOTE — Progress Notes (Signed)
Pre visit review using our clinic review tool, if applicable. No additional management support is needed unless otherwise documented below in the visit note. 

## 2014-03-09 NOTE — Patient Instructions (Addendum)
You had the flu shot today, and the tetanus shot today  Please continue all other medications as before, and refills have been done if requested.  Please have the pharmacy call with any other refills you may need.  Please continue your efforts at being more active, low cholesterol diet, and weight control.  You are otherwise up to date with prevention measures today.  Please keep your appointments with your specialists as you may have planned  Please go to the LAB in the Basement (turn left off the elevator) for the tests to be done today  You will be contacted by phone if any changes need to be made immediately.  Otherwise, you will receive a letter about your results with an explanation, but please check with MyChart first.  Please remember to sign up for MyChart if you have not done so, as this will be important to you in the future with finding out test results, communicating by private email, and scheduling acute appointments online when needed.  Please return in 1 year for your yearly visit, or sooner if needed, with Lab testing done 3-5 days before

## 2014-03-09 NOTE — Assessment & Plan Note (Signed)

## 2014-03-09 NOTE — Assessment & Plan Note (Signed)
Asymtp, for f/u a1c

## 2014-03-09 NOTE — Addendum Note (Signed)
Addended by: Lowella Dandy on: 03/09/2014 05:13 PM   Modules accepted: Orders

## 2014-03-09 NOTE — Progress Notes (Signed)
Subjective:    Patient ID: Robert Lloyd, male    DOB: [birth year], 43 y.o.   MRN: [id number]  HPI  Here for wellness and f/u;  Overall doing ok;  Pt denies CP, worsening SOB, DOE, wheezing, orthopnea, PND, worsening LE edema, palpitations, dizziness or syncope.  Pt denies neurological change such as new headache, facial or extremity weakness.  Pt denies polydipsia, polyuria, or low sugar symptoms. Pt states overall good compliance with treatment and medications, good tolerability, and has been trying to follow lower cholesterol diet.  Pt denies worsening depressive symptoms, suicidal ideation or panic. No fever, night sweats, wt loss, loss of appetite, or other constitutional symptoms.  Pt states good ability with ADL's, has low fall risk, home safety reviewed and adequate, no other significant changes in hearing or vision.  Has been walking up to 10 miles per day, lost 50 lbs in last 2 mo per pt.    Has less back pain recently with wt loss., and BP reduced, so stopped the losartan.  Zoloft 50 did not seem to help with anxiety. Klonopin helps somewhat.  Wondering if he has ADD due to some problems with attention and getting tasks done Wt Readings from Last 3 Encounters:  03/09/14 257 lb (116.574 kg)  03/23/13 280 lb (127.007 kg)  12/14/12 275 lb (124.739 kg)   Past Medical History  Diagnosis Date  . HYPERTENSION 04/10/2008    Qualifier: Diagnosis of  By: Loanne Drilling MD, Jacelyn Pi   . DEPRESSION 03/31/2007    Qualifier: Diagnosis of  By: Jenny Reichmann MD, Hunt Oris   . HYPERLIPIDEMIA 03/31/2007    Qualifier: Diagnosis of  By: Jenny Reichmann MD, Hunt Oris   . Obesity   . Anxiety state, unspecified 11/09/2012   Past Surgical History  Procedure Laterality Date  . Hernia repair    . Rotater cuff repair      reports that he has never smoked. He does not have any smokeless tobacco history on file. He reports that he drinks alcohol. He reports that he does not use illicit drugs. family history includes Hypertension in his  mother. Allergies  Allergen Reactions  . Sulfonamide Derivatives    Current Outpatient Prescriptions on File Prior to Visit  Medication Sig Dispense Refill  . clonazePAM (KLONOPIN) 0.5 MG tablet TAKE 1 TABLET TWICE A DAY AS NEEDED  60 tablet  0  . ibuprofen (ADVIL,MOTRIN) 200 MG tablet Take 200-600 mg by mouth every 6 (six) hours as needed for fever, headache, mild pain or moderate pain.      Marland Kitchen losartan (COZAAR) 50 MG tablet Take 1 tablet (50 mg total) by mouth daily.  90 tablet  3  . methocarbamol (ROBAXIN) 500 MG tablet Take 1 tablet (500 mg total) by mouth 2 (two) times daily.  20 tablet  0  . oxyCODONE-acetaminophen (PERCOCET/ROXICET) 5-325 MG per tablet Take 2 tablets by mouth every 4 (four) hours as needed for severe pain.  15 tablet  0  . sertraline (ZOLOFT) 50 MG tablet Take 1 tablet (50 mg total) by mouth daily.  30 tablet  6  . zolpidem (AMBIEN CR) 12.5 MG CR tablet Take 12.5 mg by mouth at bedtime as needed for sleep.       No current facility-administered medications on file prior to visit.   Review of Systems Constitutional: Negative for increased diaphoresis, other activity, appetite or other siginficant weight change  HENT: Negative for worsening hearing loss, ear pain, facial swelling, mouth sores and neck stiffness.  Eyes: Negative for other worsening pain, redness or visual disturbance.  Respiratory: Negative for shortness of breath and wheezing.   Cardiovascular: Negative for chest pain and palpitations.  Gastrointestinal: Negative for diarrhea, blood in stool, abdominal distention or other pain Genitourinary: Negative for hematuria, flank pain or change in urine volume.  Musculoskeletal: Negative for myalgias or other joint complaints.  Skin: Negative for color change and wound.  Neurological: Negative for syncope and numbness. other than noted Hematological: Negative for adenopathy. or other swelling Psychiatric/Behavioral: Negative for hallucinations, self-injury,  decreased concentration or other worsening agitation.      Objective:   Physical Exam BP 132/90  Pulse 80  Temp(Src) 98.2 F (36.8 C) (Oral)  Ht 6\' 3"  (1.905 m)  Wt 257 lb (116.574 kg)  BMI 32.12 kg/m2  SpO2 96% VS noted,  Constitutional: Pt is oriented to person, place, and time. Appears well-developed and well-nourished.  Head: Normocephalic and atraumatic.  Right Ear: External ear normal.  Left Ear: External ear normal.  Nose: Nose normal.  Mouth/Throat: Oropharynx is clear and moist.  Eyes: Conjunctivae and EOM are normal. Pupils are equal, round, and reactive to light.  Neck: Normal range of motion. Neck supple. No JVD present. No tracheal deviation present.  Cardiovascular: Normal rate, regular rhythm, normal heart sounds and intact distal pulses.   Pulmonary/Chest: Effort normal and breath sounds without rales or wheezing  Abdominal: Soft. Bowel sounds are normal. NT. No HSM  Musculoskeletal: Normal range of motion. Exhibits no edema.  Lymphadenopathy:  Has no cervical adenopathy.  Neurological: Pt is alert and oriented to person, place, and time. Pt has normal reflexes. No cranial nerve deficit. Motor grossly intact Skin: Skin is warm and dry. No rash noted.  Psychiatric:  Has normal mood and affect. Behavior is normal.     Assessment & Plan:

## 2014-03-09 NOTE — Assessment & Plan Note (Signed)
[place] for stay off losartan for now, cont wt loss

## 2014-03-31 ENCOUNTER — Ambulatory Visit (INDEPENDENT_AMBULATORY_CARE_PROVIDER_SITE_OTHER): Payer: 59 | Admitting: Family Medicine

## 2014-03-31 VITALS — BP 133/88 | HR 96 | Temp 98.0°F | Resp 20 | Ht 74.0 in | Wt 257.6 lb

## 2014-03-31 DIAGNOSIS — K529 Noninfective gastroenteritis and colitis, unspecified: Secondary | ICD-10-CM

## 2014-03-31 DIAGNOSIS — R1013 Epigastric pain: Secondary | ICD-10-CM

## 2014-03-31 LAB — COMPREHENSIVE METABOLIC PANEL
ALT: 30 U/L (ref 0–53)
AST: 23 U/L (ref 0–37)
Albumin: 4.2 g/dL (ref 3.5–5.2)
Alkaline Phosphatase: 56 U/L (ref 39–117)
BILIRUBIN TOTAL: 0.3 mg/dL (ref 0.2–1.2)
BUN: 19 mg/dL (ref 6–23)
CO2: 25 mEq/L (ref 19–32)
Calcium: 9.6 mg/dL (ref 8.4–10.5)
Chloride: 105 mEq/L (ref 96–112)
Creat: 0.73 mg/dL (ref 0.50–1.35)
GLUCOSE: 101 mg/dL — AB (ref 70–99)
POTASSIUM: 4 meq/L (ref 3.5–5.3)
SODIUM: 138 meq/L (ref 135–145)
TOTAL PROTEIN: 7.1 g/dL (ref 6.0–8.3)

## 2014-03-31 LAB — POCT URINALYSIS DIPSTICK
Bilirubin, UA: NEGATIVE
Glucose, UA: NEGATIVE
KETONES UA: NEGATIVE
Leukocytes, UA: NEGATIVE
Nitrite, UA: NEGATIVE
PROTEIN UA: NEGATIVE
Urobilinogen, UA: 0.2
pH, UA: 5.5

## 2014-03-31 LAB — POCT CBC
GRANULOCYTE PERCENT: 66.1 % (ref 37–80)
HEMATOCRIT: 44.9 % (ref 43.5–53.7)
Hemoglobin: 14.1 g/dL (ref 14.1–18.1)
LYMPH, POC: 2.3 (ref 0.6–3.4)
MCH: 28.2 pg (ref 27–31.2)
MCHC: 31.5 g/dL — AB (ref 31.8–35.4)
MCV: 89.3 fL (ref 80–97)
MID (CBC): 0.5 (ref 0–0.9)
MPV: 8.1 fL (ref 0–99.8)
POC Granulocyte: 5.4 (ref 2–6.9)
POC LYMPH %: 28 % (ref 10–50)
POC MID %: 5.9 %M (ref 0–12)
Platelet Count, POC: 197 10*3/uL (ref 142–424)
RBC: 5.03 M/uL (ref 4.69–6.13)
RDW, POC: 14.4 %
WBC: 8.1 10*3/uL (ref 4.6–10.2)

## 2014-03-31 LAB — LIPASE: Lipase: 19 U/L (ref 0–75)

## 2014-03-31 LAB — AMYLASE: AMYLASE: 39 U/L (ref 0–105)

## 2014-03-31 MED ORDER — ONDANSETRON 4 MG PO TBDP
8.0000 mg | ORAL_TABLET | Freq: Once | ORAL | Status: AC
  Administered 2014-03-31: 8 mg via ORAL

## 2014-03-31 MED ORDER — PROMETHAZINE HCL 12.5 MG PO TABS: 12.5000 mg | ORAL_TABLET | Freq: Four times a day (QID) | ORAL | Status: DC | PRN

## 2014-03-31 NOTE — Patient Instructions (Addendum)
1.  Recommend Gatorade or Powerade.  Viral Gastroenteritis Viral gastroenteritis is also known as stomach flu. This condition affects the stomach and intestinal tract. It can cause sudden diarrhea and vomiting. The illness typically lasts 3 to 8 days. Most people develop an immune response that eventually gets rid of the virus. While this natural response develops, the virus can make you quite ill. CAUSES  Many different viruses can cause gastroenteritis, such as rotavirus or noroviruses. You can catch one of these viruses by consuming contaminated food or water. You may also catch a virus by sharing utensils or other personal items with an infected person or by touching a contaminated surface. SYMPTOMS  The most common symptoms are diarrhea and vomiting. These problems can cause a severe loss of body fluids (dehydration) and a body salt (electrolyte) imbalance. Other symptoms may include:  Fever.  Headache.  Fatigue.  Abdominal pain. DIAGNOSIS  Your caregiver can usually diagnose viral gastroenteritis based on your symptoms and a physical exam. A stool sample may also be taken to test for the presence of viruses or other infections. TREATMENT  This illness typically goes away on its own. Treatments are aimed at rehydration. The most serious cases of viral gastroenteritis involve vomiting so severely that you are not able to keep fluids down. In these cases, fluids must be given through an intravenous line (IV). HOME CARE INSTRUCTIONS   Drink enough fluids to keep your urine clear or pale yellow. Drink small amounts of fluids frequently and increase the amounts as tolerated.  Ask your caregiver for specific rehydration instructions.  Avoid:  Foods high in sugar.  Alcohol.  Carbonated drinks.  Tobacco.  Juice.  Caffeine drinks.  Extremely hot or cold fluids.  Fatty, greasy foods.  Too much intake of anything at one time.  Dairy products until 24 to 48 hours after diarrhea  stops.  You may consume probiotics. Probiotics are active cultures of beneficial bacteria. They may lessen the amount and number of diarrheal stools in adults. Probiotics can be found in yogurt with active cultures and in supplements.  Wash your hands well to avoid spreading the virus.  Only take over-the-counter or prescription medicines for pain, discomfort, or fever as directed by your caregiver. Do not give aspirin to children. Antidiarrheal medicines are not recommended.  Ask your caregiver if you should continue to take your regular prescribed and over-the-counter medicines.  Keep all follow-up appointments as directed by your caregiver. SEEK IMMEDIATE MEDICAL CARE IF:   You are unable to keep fluids down.  You do not urinate at least once every 6 to 8 hours.  You develop shortness of breath.  You notice blood in your stool or vomit. This may look like coffee grounds.  You have abdominal pain that increases or is concentrated in one small area (localized).  You have persistent vomiting or diarrhea.  You have a fever.  The patient is a child younger than 3 months, and he or she has a fever.  The patient is a child older than 3 months, and he or she has a fever and persistent symptoms.  The patient is a child older than 3 months, and he or she has a fever and symptoms suddenly get worse.  The patient is a baby, and he or she has no tears when crying. MAKE SURE YOU:   Understand these instructions.  Will watch your condition.  Will get help right away if you are not doing well or get worse. Document Released:  06/01/2005 Document Revised: 08/24/2011 Document Reviewed: 03/18/2011 ExitCare Patient Information 2015 [place], [place]. This information is not intended to replace advice given to you by your health care provider. Make sure you discuss any questions you have with your health care provider.

## 2014-03-31 NOTE — Progress Notes (Addendum)
Subjective:    Patient ID: Robert Lloyd, male    DOB: 13-Mar-1971, 43 y.o.   MRN: [id number]  Abdominal Pain Associated symptoms include diarrhea, headaches, nausea and vomiting.   Chief Complaint  Patient presents with   Abdominal Pain    headache.  this started yesterday.  unable to keep anything down.    This chart was scribed for Wardell Honour, MD, MD by Thea Alken, ED Scribe. This patient was seen in room 5 and the patient's care was started at 5:01 PM.  HPI Comments: Robert Lloyd is a 43 y.o. male who presents to the [facility] complaining of intermittent, cramping, abdominal pain 1 day. Pt reports associated chills x 1 day ago, rhinorrhea, weakness, mild cough, nausea x 1 day ago, HA, 6 episodes of emesis x 8 hours ago and about 10 episodes of diarrhea x this morning. Pt has tried to drink 2 bottles of water but has not been able to keep this down.  Pt denies blood or mucous in stool or emesis. Pt denies SOB and dizziness. Pt reports exposure to a stomach virus that has been going around his work place. Pt denies new foods, camping and recent travels both in and out of the country. Denies recent abx use.  Past Medical History  Diagnosis Date   HYPERTENSION 04/10/2008    Qualifier: Diagnosis of  By: Loanne Drilling MD, Hilliard Clark A    DEPRESSION 03/31/2007    Qualifier: Diagnosis of  By: Jenny Reichmann MD, Hunt Oris    HYPERLIPIDEMIA 03/31/2007    Qualifier: Diagnosis of  By: Jenny Reichmann MD, Hunt Oris    Obesity    Anxiety state, unspecified 11/09/2012   Past Surgical History  Procedure Laterality Date   Hernia repair     Rotater cuff repair     Prior to Admission medications   Medication Sig Start Date End Date Taking? Authorizing Provider  clonazePAM (KLONOPIN) 0.5 MG tablet TAKE 1 TABLET TWICE A DAY AS NEEDED 08/11/13  Yes Neena Rhymes, MD  ibuprofen (ADVIL,MOTRIN) 200 MG tablet Take 200-600 mg by mouth every 6 (six) hours as needed for fever, headache, mild pain  or moderate pain.   Yes Historical Provider, MD  zolpidem (AMBIEN CR) 12.5 MG CR tablet Take 1 tablet (12.5 mg total) by mouth at bedtime as needed for sleep. 03/09/14  Yes Biagio Borg, MD   Review of Systems  Constitutional: Positive for chills, appetite change and fatigue.  HENT: Positive for rhinorrhea.   Respiratory: Positive for cough. Negative for shortness of breath.   Gastrointestinal: Positive for nausea, vomiting, abdominal pain and diarrhea.  Neurological: Positive for headaches. Negative for dizziness.   Objective:   Physical Exam  Nursing note and vitals reviewed. Constitutional: He is oriented to person, place, and time. He appears well-developed and well-nourished. No distress.  HENT:  Head: Normocephalic and atraumatic.  Right Ear: External ear normal.  Left Ear: External ear normal.  Nose: Nose normal.  Mouth/Throat: Oropharynx is clear and moist. No oropharyngeal exudate.  Eyes: Conjunctivae and EOM are normal. Pupils are equal, round, and reactive to light.  Neck: Neck supple.  Cardiovascular: Normal rate, regular rhythm and normal heart sounds.   No murmur heard. Pulmonary/Chest: Effort normal and breath sounds normal. He has no wheezes. He has no rales.  Abdominal: Soft. Bowel sounds are normal. He exhibits no distension and no mass. There is tenderness ( mild) in the epigastric area. There is no rebound and  no guarding.  Musculoskeletal: Normal range of motion.  Lymphadenopathy:    He has no cervical adenopathy.  Neurological: He is alert and oriented to person, place, and time.  Skin: Skin is warm and dry. No rash noted. He is not diaphoretic.  Psychiatric: He has a normal mood and affect. His behavior is normal.   Results for orders placed in visit on 03/31/14  POCT CBC      Result Value Ref Range   WBC 8.1  4.6 - 10.2 K/uL   Lymph, poc 2.3  0.6 - 3.4   POC LYMPH PERCENT 28.0  10 - 50 %L   MID (cbc) 0.5  0 - 0.9   POC MID % 5.9  0 - 12 %M   POC  Granulocyte 5.4  2 - 6.9   Granulocyte percent 66.1  37 - 80 %G   RBC 5.03  4.69 - 6.13 M/uL   Hemoglobin 14.1  14.1 - 18.1 g/dL   HCT, POC 44.9  43.5 - 53.7 %   MCV 89.3  80 - 97 fL   MCH, POC 28.2  27 - 31.2 pg   MCHC 31.5 (*) 31.8 - 35.4 g/dL   RDW, POC 14.4     Platelet Count, POC 197  142 - 424 K/uL   MPV 8.1  0 - 99.8 fL  POCT URINALYSIS DIPSTICK      Result Value Ref Range   Color, UA yellow     Clarity, UA clear     Glucose, UA neg     Bilirubin, UA neg     Ketones, UA neg     Spec Grav, UA >=1.030     Blood, UA trace-intact     pH, UA 5.5     Protein, UA neg     Urobilinogen, UA 0.2     Nitrite, UA neg     Leukocytes, UA Negative     ZOFRAN 8MG  ODT ADMINISTERED.   Assessment & Plan:   1. Abdominal pain, epigastric   2. Gastroenteritis     1. Abdominal pain epigastric:  New. Associated with n/v/d.  Benign abdominal exam; obtain labs. RTC for acute worsening. 2.  Gastroenteritis: New. Consistent with viral etiology. BRAT diet, hydration.  Rx for Phenergan provided.  S/p Zofran ODT in office.  RTC in upcoming 24 hours if no improvement; may warrant ivf.   Meds ordered this encounter  Medications   ondansetron (ZOFRAN-ODT) disintegrating tablet 8 mg    Sig:    promethazine (PHENERGAN) 12.5 MG tablet    Sig: Take 1-2 tablets (12.5-25 mg total) by mouth every 6 (six) hours as needed for nausea or vomiting.    Dispense:  30 tablet    Refill:  0    I personally performed the services described in this documentation, which was scribed in my presence.  The recorded information has been reviewed and is accurate.  Reginia Forts, M.D.  [facility] [address] [place], [place]  [address] [phone] phone [phone] fax

## 2014-04-06 ENCOUNTER — Telehealth: Payer: Self-pay

## 2014-04-06 NOTE — Telephone Encounter (Signed)
Completed; placed on disabilities desk.

## 2014-04-06 NOTE — Telephone Encounter (Signed)
Pt ppw complete and placed in drawer, Called and LMOM

## 2014-04-06 NOTE — Telephone Encounter (Signed)
For your information  

## 2014-04-06 NOTE — Telephone Encounter (Signed)
Pt dropped off FMLA ppw on 10/22 to Be filled out by Dr. Tamala Julian in 5-7 business days. Please return to Disability's when complete so myself or Jasmine can Scan PPW into Patient file, and call Pt for Pick up at [phone].  There is a blank copy scanned into the media folder if paperwork is misplaced.  Thank you.

## 2014-04-10 DIAGNOSIS — Z0271 Encounter for disability determination: Secondary | ICD-10-CM

## 2014-05-14 ENCOUNTER — Ambulatory Visit (INDEPENDENT_AMBULATORY_CARE_PROVIDER_SITE_OTHER): Payer: 59

## 2014-05-14 ENCOUNTER — Ambulatory Visit (INDEPENDENT_AMBULATORY_CARE_PROVIDER_SITE_OTHER): Payer: 59 | Admitting: Emergency Medicine

## 2014-05-14 VITALS — BP 124/70 | HR 85 | Temp 98.1°F | Resp 18 | Ht 75.0 in | Wt 261.0 lb

## 2014-05-14 DIAGNOSIS — R05 Cough: Secondary | ICD-10-CM

## 2014-05-14 DIAGNOSIS — R059 Cough, unspecified: Secondary | ICD-10-CM

## 2014-05-14 MED ORDER — GUAIFENESIN ER 1200 MG PO TB12: 1.0000 | ORAL_TABLET | Freq: Two times a day (BID) | ORAL | Status: DC | PRN

## 2014-05-14 MED ORDER — HYDROCOD POLST-CHLORPHEN POLST 10-8 MG/5ML PO LQCR: 5.0000 mL | Freq: Two times a day (BID) | ORAL | Status: DC | PRN

## 2014-05-14 MED ORDER — IPRATROPIUM BROMIDE 0.03 % NA SOLN: 2.0000 | Freq: Two times a day (BID) | NASAL | Status: DC

## 2014-05-14 MED ORDER — AMOXICILLIN-POT CLAVULANATE 875-125 MG PO TABS: 1.0000 | ORAL_TABLET | Freq: Two times a day (BID) | ORAL | Status: AC

## 2014-05-14 MED ORDER — ALBUTEROL SULFATE (2.5 MG/3ML) 0.083% IN NEBU
2.5000 mg | INHALATION_SOLUTION | Freq: Once | RESPIRATORY_TRACT | Status: AC
  Administered 2014-05-14: 2.5 mg via RESPIRATORY_TRACT

## 2014-05-14 MED ORDER — IPRATROPIUM BROMIDE 0.02 % IN SOLN
0.5000 mg | Freq: Once | RESPIRATORY_TRACT | Status: AC
  Administered 2014-05-14: 0.5 mg via RESPIRATORY_TRACT

## 2014-05-14 NOTE — Progress Notes (Signed)
Subjective:    Patient ID: Robert Lloyd, male    DOB: Oct 22, 1970, 43 y.o.   MRN: [id number]   PCP: [name], MD  Chief Complaint  Patient presents with  . Cough    bloody mucous x2 weeks now some pain with cough   . Fatigue  . Fever    last week     Allergies  Allergen Reactions  . Sulfonamide Derivatives     UNKNOWN reaction    Patient Active Problem List   Diagnosis Date Noted  . Impaired glucose tolerance 03/09/2014  . Preventative health care 03/09/2014  . Anxiety state, unspecified 11/09/2012  . PAIN IN JOINT OTHER SPECIFIED SITES 05/23/2010  . BACK PAIN 09/10/2009  . KNEE PAIN, LEFT, CHRONIC 02/19/2009  . Cough 06/13/2008  . 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 PULMONARY NODULE 05/09/2008  . Overweight 04/17/2008  . HYPERTENSION 04/10/2008  . Headache(784.0) 04/10/2008  . HYPERLIPIDEMIA 03/31/2007  . DEPRESSION 03/31/2007  . 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 FATIGUE 03/31/2007  . INSOMNIA, HX OF 03/31/2007    Prior to Admission medications   Medication Sig Start Date End Date Taking? Authorizing Provider  clonazePAM (KLONOPIN) 0.5 MG tablet TAKE 1 TABLET TWICE A DAY AS NEEDED 08/11/13  Yes Neena Rhymes, MD  zolpidem (AMBIEN CR) 12.5 MG CR tablet Take 1 tablet (12.5 mg total) by mouth at bedtime as needed for sleep. 03/09/14  Yes Biagio Borg, MD    Medical, Surgical, Family and Social History reviewed and updated.  HPI  This 43 y.o. male presents for evaluation of cough.  Began 2 weeks ago with a sore throat. Has had some headaches. Initially some subjective fever and chills, none since the first few days. Progressively worsening cough, now producing bloody sputum. Initially it was clear, then became green. Easily fatigued, a little SOB. Runny nose. No nasal/sinus pressure or pain. Girlfriend with similar symptoms beginning last week. Her daughter had symptoms first.  Vomiting after getting "choked" on  phlegm. No nausea. No diarrhea. No unexplained myalgias or arthralgias. Has begun to have pain in the ribs with coughing and deep inhalation.  Review of Systems As above.    Objective:   Physical Exam  Constitutional: He is oriented to person, place, and time. Vital signs are normal. He appears well-developed and well-nourished. He is active and cooperative. No distress.  BP 124/70 mmHg  Pulse 85  Temp(Src) 98.1 F (36.7 C) (Oral)  Resp 18  Ht 6\' 3"  (1.905 m)  Wt 261 lb (118.389 kg)  BMI 32.62 kg/m2  SpO2 98%  HENT:  Head: Normocephalic and atraumatic.  Right Ear: Hearing, tympanic membrane, external ear and ear canal normal.  Left Ear: Hearing, tympanic membrane, external ear and ear canal normal.  Nose: Mucosal edema and rhinorrhea present. No epistaxis.  Mouth/Throat: Uvula is midline, oropharynx is clear and moist and mucous membranes are normal.  Eyes: Conjunctivae are normal. No scleral icterus.  Neck: Normal range of motion. Neck supple. No thyromegaly present.  Cardiovascular: Normal rate, regular rhythm, normal heart sounds and normal pulses.   Pulses:      Radial pulses are 2+ on the right side, and 2+ on the left side.  Pulmonary/Chest: Effort normal and breath sounds normal.  Lymphadenopathy:       Head (right side): No tonsillar, no preauricular, no posterior auricular and no occipital adenopathy present.       Head (left side): No tonsillar,  no preauricular, no posterior auricular and no occipital adenopathy present.    He has no cervical adenopathy.       Right: No supraclavicular adenopathy present.       Left: No supraclavicular adenopathy present.  Neurological: He is alert and oriented to person, place, and time. No sensory deficit.  Skin: Skin is warm, dry and intact. No rash noted. No cyanosis or erythema. Nails show no clubbing.  Psychiatric: He has a normal mood and affect. His speech is normal and behavior is normal.   CXR: [facility] reading (PRIMARY) by   Dr. Everlene Farrier. Increased markings. Sub-centimeter density in the RIGHT hilum, likely previously noted pulmonary nodule.  Albuterol + Atrovent neb treatment with some improvement in symptoms, no change in exam-breath sounds remain clear throughout.       Assessment & Plan:  1. Cough Suspect sinusitis on initial viral URI. Blood tinge in sputum most likely from sinus irritation.  Await overread of CXR regarding pulmonary nodule, as he may need additional evaluation, especially if symptoms persist. Supportive care, anticipatory guidance. RTC if symptoms worsen/persist. - DG Chest 2 View; Future - albuterol (PROVENTIL) (2.5 MG/3ML) 0.083% nebulizer solution 2.5 mg; Take 3 mLs (2.5 mg total) by nebulization once. - ipratropium (ATROVENT) nebulizer solution 0.5 mg; Take 2.5 mLs (0.5 mg total) by nebulization once. - amoxicillin-clavulanate (AUGMENTIN) 875-125 MG per tablet; Take 1 tablet by mouth 2 (two) times daily.  Dispense: 20 tablet; Refill: 0 - chlorpheniramine-HYDROcodone (TUSSIONEX PENNKINETIC ER) 10-8 MG/5ML LQCR; Take 5 mLs by mouth every 12 (twelve) hours as needed for cough (cough).  Dispense: 100 mL; Refill: 0 - ipratropium (ATROVENT) 0.03 % nasal spray; Place 2 sprays into both nostrils 2 (two) times daily.  Dispense: 30 mL; Refill: 0 - Guaifenesin (MUCINEX MAXIMUM STRENGTH) 1200 MG TB12; Take 1 tablet (1,200 mg total) by mouth every 12 (twelve) hours as needed.  Dispense: 14 tablet; Refill: 1  While reviewing the AVS, I reminded the patient that he is overdue for follow-up with his PCP. There are multiple labs ordered in anticipation of his visit, which are now overdue. Encouraged to contact Dr. Jenny Reichmann to reschedule.  Fara Chute, PA-C Physician Assistant-Certified [facility]

## 2014-05-14 NOTE — Patient Instructions (Signed)
Get plenty of rest and drink at least 64 ounces of water daily. 

## 2014-06-11 ENCOUNTER — Other Ambulatory Visit: Payer: Self-pay | Admitting: Physician Assistant

## 2014-06-12 NOTE — Telephone Encounter (Signed)
Chelle, do you want to give RFs?

## 2014-06-21 ENCOUNTER — Encounter: Payer: Self-pay | Admitting: Podiatry

## 2014-06-21 ENCOUNTER — Ambulatory Visit (INDEPENDENT_AMBULATORY_CARE_PROVIDER_SITE_OTHER): Payer: 59

## 2014-06-21 ENCOUNTER — Ambulatory Visit (INDEPENDENT_AMBULATORY_CARE_PROVIDER_SITE_OTHER): Payer: 59 | Admitting: Podiatry

## 2014-06-21 ENCOUNTER — Other Ambulatory Visit: Payer: Self-pay | Admitting: Internal Medicine

## 2014-06-21 DIAGNOSIS — M722 Plantar fascial fibromatosis: Secondary | ICD-10-CM

## 2014-06-21 MED ORDER — TRIAMCINOLONE ACETONIDE 10 MG/ML IJ SUSP
10.0000 mg | Freq: Once | INTRAMUSCULAR | Status: AC
  Administered 2014-06-21: 10 mg

## 2014-06-21 MED ORDER — PREDNISONE 10 MG PO TABS: ORAL_TABLET | ORAL | Status: DC

## 2014-06-21 NOTE — Progress Notes (Signed)
Subjective:     Patient ID: Robert Lloyd, male   DOB: 22-Sep-1970, 44 y.o.   MRN: [id number]  HPI patient is noted to have severe discomfort plantar aspect right heel at the insertional point of the tendon into the calcaneus with fluid buildup. A should gives history of having had endoscopic surgery left heel several years ago   Review of Systems     Objective:   Physical Exam Neurovascular status is intact with muscle strength adequate and range of motion of the subtalar and midtarsal joint within normal limits. Patient's noted to have severe discomfort plantar aspect right heel at the insertion of the tendon into the calcaneus    Assessment:     Plantar fasciitis right with inflammation and fluid around the medial band    Plan:     H&P and x-ray reviewed. Injected the right plantar tendon 3 mg Kenalog 5 mg Xylocaine and instructed on fascially brace usage which was dispensed and placed on Sterapred DS Dosepak 12 day with instructions

## 2014-06-21 NOTE — Patient Instructions (Signed)

## 2014-06-22 NOTE — Telephone Encounter (Signed)
Done hardcopy to robin  

## 2014-06-22 NOTE — Telephone Encounter (Signed)
Faxed hardcopy for Clonazepam to [facility]

## 2014-06-25 ENCOUNTER — Telehealth: Payer: Self-pay | Admitting: *Deleted

## 2014-06-25 ENCOUNTER — Encounter: Payer: Self-pay | Admitting: *Deleted

## 2014-06-25 NOTE — Telephone Encounter (Signed)
"  I saw Dr. Paulla Dolly on Thursday for Plantar Fasciitis on my right foot.  He said he was going to treat it aggressive for 2 weeks, gave me a shot with steroids and stuff.  Well, I was thinking I would be fine to work the weekend.  Because my foot was hurting so bad, I ended up missing Saturday and Sunday and today.  It feels a little better today.  I'm wondering is it possible to get a note for those 3 days.  I can use my FMLA and won't have to use my vacation.  Give me a call back when you find out.  I need to know sometime today."

## 2014-06-25 NOTE — Telephone Encounter (Signed)
I called and informed the patient that Dr. Josephina Shih the note.  You can come by and pick it up.  I will leave it at the front desk.  "Is it excusing me for Friday, Saturday and Monday?"  Yes, it is.  "Alright thank you.  Is it okay to pick it up in the morning?"  Yes it is.

## 2014-06-29 DIAGNOSIS — M79673 Pain in unspecified foot: Secondary | ICD-10-CM

## 2014-07-20 DIAGNOSIS — M722 Plantar fascial fibromatosis: Secondary | ICD-10-CM

## 2014-10-17 ENCOUNTER — Telehealth: Payer: Self-pay | Admitting: Family Medicine

## 2014-10-17 NOTE — Telephone Encounter (Signed)
Patient has a new patient appointment on the 17th.  Patient states he is in pain and can not stand to work.  He is requesting to be worked in sooner.  Please advise.

## 2014-10-17 NOTE — Telephone Encounter (Signed)
Pt scheduled this Friday @ 9:30.

## 2014-10-19 ENCOUNTER — Other Ambulatory Visit (INDEPENDENT_AMBULATORY_CARE_PROVIDER_SITE_OTHER): Payer: 59

## 2014-10-19 ENCOUNTER — Ambulatory Visit (INDEPENDENT_AMBULATORY_CARE_PROVIDER_SITE_OTHER)
Admission: RE | Admit: 2014-10-19 | Discharge: 2014-10-19 | Disposition: A | Discharge status: Home / Self Care | Payer: 59 | Source: Ambulatory Visit | Attending: Family Medicine | Admitting: Family Medicine

## 2014-10-19 ENCOUNTER — Encounter: Payer: Self-pay | Admitting: *Deleted

## 2014-10-19 ENCOUNTER — Encounter: Payer: Self-pay | Admitting: Family Medicine

## 2014-10-19 ENCOUNTER — Ambulatory Visit (INDEPENDENT_AMBULATORY_CARE_PROVIDER_SITE_OTHER): Payer: 59 | Admitting: Family Medicine

## 2014-10-19 VITALS — BP 122/74 | HR 87 | Ht 75.0 in | Wt 270.0 lb

## 2014-10-19 DIAGNOSIS — M25561 Pain in right knee: Secondary | ICD-10-CM

## 2014-10-19 MED ORDER — MELOXICAM 15 MG PO TABS: 15.0000 mg | ORAL_TABLET | Freq: Every day | ORAL | Status: DC

## 2014-10-19 NOTE — Progress Notes (Signed)
Pre visit review using our clinic review tool, if applicable. No additional management support is needed unless otherwise documented below in the visit note. 

## 2014-10-19 NOTE — Patient Instructions (Addendum)
Good to see you  Ice 20 minutes 2 times daily. Usually after activity and before bed. Wear brace with walking Meloxicam daily for 10 days then as needed Get xray downstairs today See me again next week.

## 2014-10-19 NOTE — Progress Notes (Signed)
[facility] [address] [place], [place] [address] Phone: [phone] Subjective:    I'm seeing this patient by the request  of:  [name], MD   CC:  Right knee pain  EXN:[id number] JOSHAUA EPPLE is a 44 y.o. male coming in with complaint of  Right knee pain.patient sates he has had this pain for approximate 4 months. Patient states that from time to time he has this popping sensation that does give him significant amount of pain. This used to happen only approximately once a week but now is happening 2 times a day at least. Patient states when he does this unfortunately he is unable to even walk for multiple minutes if not hours. Then he is able to barely weight-bear. States that the pain is starting to worsen. Describes it as a dull throbbing pain with sharp pain when he does do the popping. Patient rates it as more of a 9 out of 10. Sometimes feels like his kneecap is not connected to his knee. Finds it very difficult to follow sleep at night. Patient has not been able to work for the last 3 days secondary to this pain.patient does not remember any true injury. States that it does respond somewhat to anti-inflammatories.  Past Medical History  Diagnosis Date  . HYPERTENSION 04/10/2008    Qualifier: Diagnosis of  By: Loanne Drilling MD, Jacelyn Pi   . DEPRESSION 03/31/2007    Qualifier: Diagnosis of  By: Jenny Reichmann MD, Hunt Oris   . HYPERLIPIDEMIA 03/31/2007    Qualifier: Diagnosis of  By: Jenny Reichmann MD, Hunt Oris   . Obesity   . Anxiety state, unspecified 11/09/2012   Past Surgical History  Procedure Laterality Date  . Rotater cuff repair Right   . Hernia repair      infancy   History  Substance Use Topics  . Smoking status: Never Smoker   . Smokeless tobacco: Never Used  . Alcohol Use: 0.0 oz/week    0 Standard drinks or equivalent per week     Comment: occasionally   Allergies  Allergen Reactions  . Sulfonamide Derivatives     UNKNOWN reaction   Family History   Problem Relation Age of Onset  . Hypertension Mother        Past medical history, social, surgical and family history all reviewed in electronic medical record.   Review of Systems: No headache, visual changes, nausea, vomiting, diarrhea, constipation, dizziness, abdominal pain, skin rash, fevers, chills, night sweats, weight loss, swollen lymph nodes, body aches, joint swelling, muscle aches, chest pain, shortness of breath, mood changes.   Objective Blood pressure 122/74, pulse 87, height 6\' 3"  (1.905 m), weight 270 lb (122.471 kg), SpO2 97 %.  General: No apparent distress alert and oriented x3 mood and affect normal, dressed appropriately.  HEENT: Pupils equal, extraocular movements intact  Respiratory: Patient's speak in full sentences and does not appear short of breath  Cardiovascular: No lower extremity edema, non tender, no erythema  Skin: Warm dry intact with no signs of infection or rash on extremities or on axial skeleton.  Abdomen: Soft nontender  Neuro: Cranial nerves II through XII are intact, neurovascularly intact in all extremities with 2+ DTRs and 2+ pulses.  Lymph: No lymphadenopathy of posterior or anterior cervical chain or axillae bilaterally.  Gait normal with good balance and coordination.  MSK:  Non tender with full range of motion and good stability and symmetric strength and tone of shoulders, elbows, wrist, hip,  and ankles bilaterally.  Knee: right Patient does have some atrophy of the quadriceps right greater than left tender to palpation over the medial patella as well as medial joint line Motion lacks last 5 of extension and the last 5 of flexion Ligaments with solid consistent endpoints including ACL, PCL, LCL, MCL. positiveMcmurray's, Apley's, and Thessalonian tests. severely painful patellar compression. Patellar glide with mildcrepitus. Patellar and quadriceps tendons unremarkable. Hamstring and quadriceps strength is normal.  Contralateral knee  unremarkable.   MSK US performed of: right knee pain This study was ordered, performed, and interpreted by Charlann Boxer D.O.  Knee: All structures visualized. Anterior medial meniscus does have a very large tear noted. Patient does not have any hypoechoic changes and does not displace. Patellar Tendon unremarkable on long and transverse views without effusion.patient on the underside of the patella itself does have some swelling. Increasing Doppler flow over the cortex as well. No abnormality of prepatellar bursa. LCL and MCL unremarkable on long and transverse views. No abnormality of origin of medial or lateral head of the gastrocnemius.  IMPRESSION:  Questionable defect on the underside of the patellar secondary to chronic subluxationchronic tear of the medial meniscus    Impression and Recommendations:     This case required medical decision making of moderate complexity.

## 2014-10-19 NOTE — Assessment & Plan Note (Signed)
Patient's right knee pain as a differential diagnosis for chronic patellar subluxation versus dislocation versus potential medial meniscal tear. Patient's medial meniscal tear shows that this is likely about 4-6 months old but there is no significant hypoechoic changes and no displacement which makes this being a internal derangement less likely. There is a possibility for a buckle handle tear deeper. Patient does have hypoechoic changes underneath the patella itself and x-rays were ordered today for further evaluation. Patient is having chronic subluxation he is not the normal patient. Depending on how patient response to oral anti-inflammatories, bracing, icing and light duty at work we will discuss its advance imaging is necessary. Patient continues with instability we will need to consider MRI.

## 2014-10-23 ENCOUNTER — Telehealth: Payer: Self-pay | Admitting: Internal Medicine

## 2014-10-23 DIAGNOSIS — M25561 Pain in right knee: Secondary | ICD-10-CM

## 2014-10-23 NOTE — Telephone Encounter (Signed)
Pt called in said that he went back to work today and his knee popped out again.  His job sent him home.  He states that he is in a lot of pain and needs something for pain and a note for work.  He has an appt with Dr Tamala Julian on the 13th.    Best number [phone]

## 2014-10-23 NOTE — Telephone Encounter (Signed)
Discussed with pt, MRI entered.

## 2014-10-23 NOTE — Telephone Encounter (Signed)
We need MRI, tell him we will order (and please order)  Will see him on the 13th.   OK to give him a note until he sees me again.  Continue the meloxicam. Can add tylenol 650 mg 3 times dailly  Ice 20 minutes 2 times daily. Usually after activity and before bed.

## 2014-10-24 NOTE — Telephone Encounter (Signed)
Patient calling to give fax #. He is advising that he cannot get it until he speaks to case worker.

## 2014-10-25 ENCOUNTER — Encounter: Payer: Self-pay | Admitting: *Deleted

## 2014-10-26 ENCOUNTER — Encounter: Payer: Self-pay | Admitting: *Deleted

## 2014-10-26 ENCOUNTER — Telehealth: Payer: Self-pay | Admitting: Family Medicine

## 2014-10-26 ENCOUNTER — Ambulatory Visit: Payer: Self-pay | Admitting: Family Medicine

## 2014-10-26 NOTE — Telephone Encounter (Signed)
Spoke to pt, faxed over a new letter putting him out of work until 10/30/14 until he comes back to see dr Tamala Julian to f/u on his MRI.  Letter faxed to [phone]

## 2014-10-26 NOTE — Telephone Encounter (Signed)
Called jodi & discussed with her that the work note faxed today was the correct note.

## 2014-10-26 NOTE — Telephone Encounter (Signed)
Patient states that Dr. Tamala Julian wrote him a note to go back to work tonight.  He states that he has a quarter mile walk from his car to the building where he works.  He is afraid that his knee will pop out of place and he will not be able to make it back to his car.  States he will not have anyone to help him get back to his car on the weekend.  He also states that he has to move around to get work.  He is requesting to be written out until he has his MRI.  States he does need a response quickly b/c if he does end up having to go to work tonight he will have to go to bed.

## 2014-10-26 NOTE — Telephone Encounter (Signed)
Robert Lloyd from [employer] called saying she received two different orders about his work statis. Could you please call her at [phone] ext [phone].

## 2014-10-29 ENCOUNTER — Ambulatory Visit
Admission: RE | Admit: 2014-10-29 | Discharge: 2014-10-29 | Disposition: A | Discharge status: Home / Self Care | Payer: 59 | Source: Ambulatory Visit | Attending: Family Medicine | Admitting: Family Medicine

## 2014-10-29 DIAGNOSIS — M25561 Pain in right knee: Secondary | ICD-10-CM

## 2014-10-30 ENCOUNTER — Telehealth: Payer: Self-pay | Admitting: Internal Medicine

## 2014-10-30 ENCOUNTER — Encounter: Payer: Self-pay | Admitting: Family Medicine

## 2014-10-30 ENCOUNTER — Ambulatory Visit (INDEPENDENT_AMBULATORY_CARE_PROVIDER_SITE_OTHER): Payer: 59 | Admitting: Family Medicine

## 2014-10-30 ENCOUNTER — Encounter: Payer: Self-pay | Admitting: *Deleted

## 2014-10-30 VITALS — BP 122/84 | HR 87 | Ht 75.0 in | Wt 268.0 lb

## 2014-10-30 DIAGNOSIS — M25561 Pain in right knee: Secondary | ICD-10-CM

## 2014-10-30 DIAGNOSIS — M9420 Chondromalacia, unspecified site: Secondary | ICD-10-CM | POA: Diagnosis not present

## 2014-10-30 MED ORDER — CLONAZEPAM 0.5 MG PO TABS: 0.5000 mg | ORAL_TABLET | Freq: Two times a day (BID) | ORAL | Status: DC | PRN

## 2014-10-30 NOTE — Telephone Encounter (Signed)
Pt had an appt with Dr. Tamala Julian this morning and on his way out of the office he stated he is in need of a refill on Clonazepam 0.5 mg tab.  [facility] for refill.

## 2014-10-30 NOTE — Telephone Encounter (Signed)
Done hardcopy to Dahlia  

## 2014-10-30 NOTE — Patient Instructions (Signed)
Good news overall Tried injection today.  Continue brace with a lot of activity Ice 20 minutes 2 times daily. Usually after activity and before bed. Exercises starting on Monday.  Ok to do bike, elliptical but avoid running on treadmill See me again in 2 weeks.

## 2014-10-30 NOTE — Progress Notes (Signed)
Pre visit review using our clinic review tool, if applicable. No additional management support is needed unless otherwise documented below in the visit note. 

## 2014-10-30 NOTE — Progress Notes (Signed)
[facility] [place], [place] [address] Phone: [phone] Subjective:     CC:  Right knee pain  SEG:[id number] Robert Lloyd is a 44 y.o. male coming in with complaint of  Right knee pain.  She was having with considered to be more of an instability of the knee. There was concern for meniscal injury. Patient did have an MRI and is here for further evaluation. 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 Otherwise no meniscal injury or any type of ligamentous injury. Patient states that over the course last week he has made some improvement. Patient is not working has noticed that he had better range of motion of the knee. No significant instability. Patient does not have the popping sensation that he had previously.  Past Medical History  Diagnosis Date  . HYPERTENSION 04/10/2008    Qualifier: Diagnosis of  By: Loanne Drilling MD, Jacelyn Pi   . DEPRESSION 03/31/2007    Qualifier: Diagnosis of  By: Jenny Reichmann MD, Hunt Oris   . HYPERLIPIDEMIA 03/31/2007    Qualifier: Diagnosis of  By: Jenny Reichmann MD, Hunt Oris   . Obesity   . Anxiety state, unspecified 11/09/2012   Past Surgical History  Procedure Laterality Date  . Rotater cuff repair Right   . Hernia repair      infancy   History  Substance Use Topics  . Smoking status: Never Smoker   . Smokeless tobacco: Never Used  . Alcohol Use: 0.0 oz/week    0 Standard drinks or equivalent per week     Comment: occasionally   Allergies  Allergen Reactions  . Sulfonamide Derivatives     UNKNOWN reaction   Family History  Problem Relation Age of Onset  . Hypertension Mother        Past medical history, social, surgical and family history all reviewed in electronic medical record.   Review of Systems: No headache, visual changes, nausea, vomiting, diarrhea, constipation, dizziness, abdominal pain, skin rash, fevers, chills, night sweats, weight loss, swollen lymph nodes, body aches, joint  swelling, muscle aches, chest pain, shortness of breath, mood changes.   Objective Blood pressure 122/84, pulse 87, height 6\' 3"  (1.905 m), weight 268 lb (121.564 kg), SpO2 97 %.  General: No apparent distress alert and oriented x3 mood and affect normal, dressed appropriately.  HEENT: Pupils equal, extraocular movements intact  Respiratory: Patient's speak in full sentences and does not appear short of breath  Cardiovascular: No lower extremity edema, non tender, no erythema  Skin: Warm dry intact with no signs of infection or rash on extremities or on axial skeleton.  Abdomen: Soft nontender  Neuro: Cranial nerves II through XII are intact, neurovascularly intact in all extremities with 2+ DTRs and 2+ pulses.  Lymph: No lymphadenopathy of posterior or anterior cervical chain or axillae bilaterally.  Gait normal with good balance and coordination.  MSK:  Non tender with full range of motion and good stability and symmetric strength and tone of shoulders, elbows, wrist, hip, and ankles bilaterally.  Knee: right Patient does have some atrophy of the quadriceps right greater than left tender to palpation over the medial patella as well as medial joint line Motion lacks last 5 of extension and the last 5 of flexion Ligaments with solid consistent endpoints including ACL, PCL, LCL, MCL. Continued discomfort over the medial patella Patellar glide with mildcrepitus. Patellar and quadriceps tendons unremarkable. Hamstring and quadriceps strength is normal.  Contralateral knee unremarkable.  Procedure  note After informed written and verbal consent, patient was seated on exam table. Right knee was prepped with alcohol swab and utilizing anterolateral approach, patient's right knee space was injected with 4:1  marcaine 0.5%: Kenalog 40mg /dL. Patient tolerated the procedure well without immediate complications.    Impression and Recommendations:     This case required medical decision making  of moderate complexity.

## 2014-10-30 NOTE — Assessment & Plan Note (Signed)
Patient was given an injection today. Patient will start home exercises in the next 48 hours. Patient still has the meloxicam but if necessary. We discussed the icing protocol and what activities to avoid. Patient will return to work on a trial of full duty for the next 2 weeks. Patient will come back in 2 weeks for further evaluation.

## 2014-10-31 NOTE — Telephone Encounter (Signed)
Rx faxed to pharmacy per pt request

## 2015-03-01 ENCOUNTER — Ambulatory Visit (INDEPENDENT_AMBULATORY_CARE_PROVIDER_SITE_OTHER): Payer: 59 | Admitting: Podiatry

## 2015-03-01 ENCOUNTER — Encounter: Payer: Self-pay | Admitting: Podiatry

## 2015-03-01 VITALS — BP 113/79 | HR 80 | Resp 16

## 2015-03-01 DIAGNOSIS — M722 Plantar fascial fibromatosis: Secondary | ICD-10-CM | POA: Diagnosis not present

## 2015-03-01 NOTE — Patient Instructions (Signed)
Pre-Operative Instructions  Congratulations, you have decided to take an important step to improving your quality of life.  You can be assured that the doctors of [facility] will be with you every step of the way.  1. Plan to be at the surgery center/hospital at least 1 (one) hour prior to your scheduled time unless otherwise directed by the surgical center/hospital staff.  You must have a responsible adult accompany you, remain during the surgery and drive you home.  Make sure you have directions to the surgical center/hospital and know how to get there on time. 2. For hospital based surgery you will need to obtain a history and physical form from your family physician within 1 month prior to the date of surgery- we will give you a form for you primary physician.  3. We make every effort to accommodate the date you request for surgery.  There are however, times where surgery dates or times have to be moved.  We will contact you as soon as possible if a change in schedule is required.   4. No Aspirin/Ibuprofen for one week before surgery.  If you are on aspirin, any non-steroidal anti-inflammatory medications (Mobic, Aleve, Ibuprofen) you should stop taking it 7 days prior to your surgery.  You make take Tylenol  For pain prior to surgery.  5. Medications- If you are taking daily heart and blood pressure medications, seizure, reflux, allergy, asthma, anxiety, pain or diabetes medications, make sure the surgery center/hospital is aware before the day of surgery so they may notify you which medications to take or avoid the day of surgery. 6. No food or drink after midnight the night before surgery unless directed otherwise by surgical center/hospital staff. 7. No alcoholic beverages 24 hours prior to surgery.  No smoking 24 hours prior to or 24 hours after surgery. 8. Wear loose pants or shorts- loose enough to fit over bandages, boots, and casts. 9. No slip on shoes, sneakers are best. 10. Bring  your boot with you to the surgery center/hospital.  Also bring crutches or a walker if your physician has prescribed it for you.  If you do not have this equipment, it will be provided for you after surgery. 11. If you have not been contracted by the surgery center/hospital by the day before your surgery, call to confirm the date and time of your surgery. 12. Leave-time from work may vary depending on the type of surgery you have.  Appropriate arrangements should be made prior to surgery with your employer. 13. Prescriptions will be provided immediately following surgery by your doctor.  Have these filled as soon as possible after surgery and take the medication as directed. 14. Remove nail polish on the operative foot. 15. Wash the night before surgery.  The night before surgery wash the foot and leg well with the antibacterial soap provided and water paying special attention to beneath the toenails and in between the toes.  Rinse thoroughly with water and dry well with a towel.  Perform this wash unless told not to do so by your physician.  Enclosed: 1 Ice pack (please put in freezer the night before surgery)   1 Hibiclens skin cleaner   Pre-op Instructions  If you have any questions regarding the instructions, do not hesitate to call our office.  [place]: [address] [place], [place] [address] [phone]  [place]: [address] [place], [place] [address] [phone]  [place]: [address]  [place], [place] [address] [phone]  Dr. Richard   Tuchman DPM, Dr. Norman Regal DPM Dr. Richard Sikora DPM, Dr. M. Todd Hyatt DPM, Dr. Kathryn Egerton DPM 

## 2015-03-03 NOTE — Progress Notes (Signed)
Subjective:     Patient ID: Robert Lloyd, male   DOB: 01-Jan-1971, 44 y.o.   MRN: [id number]  HPI patient presents stating that his heel is absolutely killing him and he needs to have it fixed like his left one was done and it did not respond to conservative care and is only been hurting him continuously for the last year   Review of Systems     Objective:   Physical Exam Neurovascular status is found to be intact with severe discomfort plantar heel right at the insertional point tendon into the calcaneus with fluid buildup noted around the medial band and depression of the arch. Well-healed surgical scar left heel from previous surgery that did well    Assessment:     Chronic plantar fasciitis right failed to respond to numerous conservative cares including injection treatment brace usage shoe gear modifications physical therapy and reduced activity    Plan:     Reviewed condition and at this point recommended that this be corrected. I explained to him surgery allowing him to read consent form reviewing alternative treatments and complications and the fact that recovery from this can take 6 months to one year. Patient wants surgery signed consent form is given all preoperative instructions for procedure and is scheduled for outpatient surgery and is encouraged to call with any questions prior to procedure

## 2015-03-25 ENCOUNTER — Telehealth: Payer: Self-pay | Admitting: *Deleted

## 2015-03-25 NOTE — Telephone Encounter (Signed)
I wanted to verify that my surgery is scheduled for November 1st for Plantar Fasciitis on my right foot.  I want to make sure I have the right foot for when I tell my work."

## 2015-03-26 NOTE — Telephone Encounter (Signed)
"  I'm calling to confirm I'm scheduled for surgery on November 1st."  I called and left patient a message that surgery is scheduled for November 1st.  I just spoke to Republic at [facility] and confirmed.  She's with disability.  "Calling to confirm that he's scheduled for surgery on April 16, 2015 with Dr. Paulla Dolly."  I'm calling to confirm that patient is scheduled for surgery on April 16, 2015.  "What's the CPT code?"  It is [phone].

## 2015-04-01 ENCOUNTER — Telehealth: Payer: Self-pay | Admitting: *Deleted

## 2015-04-01 NOTE — Telephone Encounter (Signed)
I faxed authorization for surgery scheduled for 04/16/2015 for an Edoscopic Plantar Fasciotomy right foot to Caren Griffins at [facility].  Authorization number is [id number].

## 2015-04-01 NOTE — Telephone Encounter (Signed)
"  I'm scheduled for surgery on November 1.  I'm wondering if Dr. Paulla Dolly can take me out of work until I have surgery.  My foot is killing me.  It stings and burns when I stand on it.  I hurts so bad, it makes me nauseous."  I'll have to see what Dr. Paulla Dolly says and I'll call you back.  "Let me know as soon as possible so I'll know what to do."

## 2015-04-02 NOTE — Telephone Encounter (Signed)
"  I'm calling to follow up on our conversation yesterday about moving up my surgery and getting a note to be out of work."  Dr. Paulla Dolly said you could move your surgery up to October 25 but he cannot write you out of work until your November 1st surgery date.  "Let's just leave it scheduled for November 1st.  I'll just use my FMLA as needed until then.  Thank you."

## 2015-04-02 NOTE — Telephone Encounter (Signed)
Pt called states he spoke with the surgery scheduler about moving his surgery up and getting a note to be out of work until the surgery, and he hasn't heard anything.

## 2015-04-11 DIAGNOSIS — M722 Plantar fascial fibromatosis: Secondary | ICD-10-CM | POA: Diagnosis not present

## 2015-04-16 DIAGNOSIS — M722 Plantar fascial fibromatosis: Secondary | ICD-10-CM | POA: Diagnosis not present

## 2015-04-17 ENCOUNTER — Telehealth: Payer: Self-pay | Admitting: *Deleted

## 2015-04-18 NOTE — Telephone Encounter (Signed)
"  Calling regarding surgery that took place yesterday.  I need to confirm that it went forth.  He's supposed to have a right Plantar Fasciotomy.  What's the post-op date?  If I don't answer leave a message on my secure voicemail."  I returned her call and informed her he did have surgery.  His next post-op appointment is scheduled for 04/26/2015.  "It was for a Plantar Fasciotomy right foot correct?"  Yes, that is correct.

## 2015-04-26 ENCOUNTER — Ambulatory Visit (INDEPENDENT_AMBULATORY_CARE_PROVIDER_SITE_OTHER): Payer: 59 | Admitting: Podiatry

## 2015-04-26 VITALS — Temp 97.3°F

## 2015-04-26 DIAGNOSIS — Z9889 Other specified postprocedural states: Secondary | ICD-10-CM | POA: Diagnosis not present

## 2015-04-26 DIAGNOSIS — M722 Plantar fascial fibromatosis: Secondary | ICD-10-CM | POA: Diagnosis not present

## 2015-04-26 MED ORDER — HYDROCODONE-ACETAMINOPHEN 10-325 MG PO TABS: 1.0000 | ORAL_TABLET | Freq: Three times a day (TID) | ORAL | Status: DC | PRN

## 2015-04-28 NOTE — Progress Notes (Signed)
Subjective:     Patient ID: Robert Lloyd, male   DOB: 1970/10/12, 44 y.o.   MRN: [id number]  HPI patient states I'm doing real well with minimal discomfort   Review of Systems     Objective:   Physical Exam Neurovascular status intact negative Homans sign noted with well-healed surgical site medial lateral right plantar heel secondary to endoscopic surgery    Assessment:     Doing well post endoscopic surgery right    Plan:     Advised on physical therapy and reapplied sterile dressing continue immobilization and reappoint 2 weeks for suture removal or earlier if needed

## 2015-05-02 ENCOUNTER — Ambulatory Visit (INDEPENDENT_AMBULATORY_CARE_PROVIDER_SITE_OTHER): Payer: 59 | Admitting: Podiatry

## 2015-05-02 DIAGNOSIS — Z9889 Other specified postprocedural states: Secondary | ICD-10-CM

## 2015-05-02 DIAGNOSIS — M722 Plantar fascial fibromatosis: Secondary | ICD-10-CM

## 2015-05-02 NOTE — Progress Notes (Signed)
Subjective:     Patient ID: Robert Lloyd, male   DOB: 1970/12/11, 44 y.o.   MRN: [id number]  HPI patient states my foot feels good with minimal discomfort and wound edges coapted well   Review of Systems     Objective:   Physical Exam Neurovascular status intact muscle strength adequate with patient noted to have well-healed surgical site medial lateral aspect right heel    Assessment:     Doing well post endoscopic surgery right    Plan:     Stitches removed wound edges coapted well applied sterile dressings and instructed on physical therapy

## 2015-05-03 ENCOUNTER — Encounter: Payer: Self-pay | Admitting: Podiatry

## 2015-05-17 ENCOUNTER — Telehealth: Payer: Self-pay | Admitting: Internal Medicine

## 2015-05-17 MED ORDER — ZOLPIDEM TARTRATE ER 12.5 MG PO TBCR
12.5000 mg | EXTENDED_RELEASE_TABLET | Freq: Every evening | ORAL | Status: DC | PRN
Start: 2015-05-17 — End: 2015-05-24

## 2015-05-17 MED ORDER — ZOLPIDEM TARTRATE ER 12.5 MG PO TBCR: 12.5000 mg | EXTENDED_RELEASE_TABLET | Freq: Every evening | ORAL | Status: DC | PRN

## 2015-05-17 NOTE — Telephone Encounter (Signed)
I have scheduled an appt with patient for next week.  He is out of his medication and wondering if you can send enough in till he comes in.

## 2015-05-17 NOTE — Telephone Encounter (Signed)
Done hardcopy to Dahlia  

## 2015-05-17 NOTE — Addendum Note (Signed)
Addended by: Biagio Borg on: 05/17/2015 05:33 PM   Modules accepted: Orders

## 2015-05-17 NOTE — Telephone Encounter (Signed)
The zolpidem (AMBIEN CR) 12.5 MG CR tablet [id number]

## 2015-05-17 NOTE — Telephone Encounter (Signed)
Please verify which medication, thanks

## 2015-05-17 NOTE — Telephone Encounter (Signed)
disregard

## 2015-05-17 NOTE — Telephone Encounter (Signed)
Rx faxed to pharmacy  

## 2015-05-21 NOTE — Telephone Encounter (Signed)
[facility]

## 2015-05-24 ENCOUNTER — Ambulatory Visit (INDEPENDENT_AMBULATORY_CARE_PROVIDER_SITE_OTHER): Payer: 59 | Admitting: Internal Medicine

## 2015-05-24 VITALS — BP 128/82 | HR 101 | Temp 98.3°F | Ht 75.0 in | Wt 288.0 lb

## 2015-05-24 DIAGNOSIS — Z0000 Encounter for general adult medical examination without abnormal findings: Secondary | ICD-10-CM | POA: Diagnosis not present

## 2015-05-24 DIAGNOSIS — F338 Other recurrent depressive disorders: Secondary | ICD-10-CM | POA: Insufficient documentation

## 2015-05-24 DIAGNOSIS — Z23 Encounter for immunization: Secondary | ICD-10-CM

## 2015-05-24 MED ORDER — LIRAGLUTIDE 18 MG/3ML ~~LOC~~ SOPN: PEN_INJECTOR | SUBCUTANEOUS | Status: DC

## 2015-05-24 MED ORDER — INSULIN PEN NEEDLE 32G X 4 MM MISC: Status: DC

## 2015-05-24 MED ORDER — ZOLPIDEM TARTRATE ER 12.5 MG PO TBCR: 12.5000 mg | EXTENDED_RELEASE_TABLET | Freq: Every evening | ORAL | Status: DC | PRN

## 2015-05-24 MED ORDER — CLONAZEPAM 0.5 MG PO TABS: 0.5000 mg | ORAL_TABLET | Freq: Two times a day (BID) | ORAL | Status: DC | PRN

## 2015-05-24 NOTE — Progress Notes (Signed)
Pre visit review using our clinic review tool, if applicable. No additional management support is needed unless otherwise documented below in the visit note. 

## 2015-05-24 NOTE — Progress Notes (Signed)
Subjective:    Patient ID: Robert Lloyd, male    DOB: [birth year], 44 y.o.   MRN: [id number]  HPI  Here for wellness and f/u;  Overall doing ok;  Pt denies Chest pain, worsening SOB, DOE, wheezing, orthopnea, PND, worsening LE edema, palpitations, dizziness or syncope.  Pt denies neurological change such as new headache, facial or extremity weakness.  Pt denies polydipsia, polyuria, or low sugar symptoms. Pt states overall good compliance with treatment and medications, good tolerability, and has been trying to follow appropriate diet.  Pt denies worsening depressive symptoms, suicidal ideation or panic. No fever, night sweats, wt loss, loss of appetite, or other constitutional symptoms.  Pt states good ability with ADL's, has low fall risk, home safety reviewed and adequate, no other significant changes in hearing or vision, and only occasionally active with exercise.  Has gained significant weight, has been more depressed recently, has tried several meds in the past, no SI or HI. Wt Readings from Last 3 Encounters:  05/24/15 288 lb (130.636 kg)  10/30/14 268 lb (121.564 kg)  10/29/14 260 lb (117.935 kg)   Past Medical History  Diagnosis Date  . HYPERTENSION 04/10/2008    Qualifier: Diagnosis of  By: Loanne Drilling MD, Jacelyn Pi   . DEPRESSION 03/31/2007    Qualifier: Diagnosis of  By: Jenny Reichmann MD, Hunt Oris   . HYPERLIPIDEMIA 03/31/2007    Qualifier: Diagnosis of  By: Jenny Reichmann MD, Hunt Oris   . Obesity   . Anxiety state, unspecified 11/09/2012   Past Surgical History  Procedure Laterality Date  . Rotater cuff repair Right   . Hernia repair      infancy    reports that he has never smoked. He has never used smokeless tobacco. He reports that he drinks alcohol. He reports that he does not use illicit drugs. family history includes Hypertension in his mother. Allergies  Allergen Reactions  . Sulfonamide Derivatives     UNKNOWN reaction   Current Outpatient Prescriptions on File Prior to Visit    Medication Sig Dispense Refill  . HYDROcodone-acetaminophen (NORCO) 10-325 MG tablet Take 1 tablet by mouth every 8 (eight) hours as needed. 30 tablet 0  . meloxicam (MOBIC) 15 MG tablet Take 1 tablet (15 mg total) by mouth daily. 30 tablet 0   No current facility-administered medications on file prior to visit.   Review of Systems Constitutional: Negative for increased diaphoresis, other activity, appetite or siginficant weight change other than noted HENT: Negative for worsening hearing loss, ear pain, facial swelling, mouth sores and neck stiffness.   Eyes: Negative for other worsening pain, redness or visual disturbance.  Respiratory: Negative for shortness of breath and wheezing  Cardiovascular: Negative for chest pain and palpitations.  Gastrointestinal: Negative for diarrhea, blood in stool, abdominal distention or other pain Genitourinary: Negative for hematuria, flank pain or change in urine volume.  Musculoskeletal: Negative for myalgias or other joint complaints.  Skin: Negative for color change and wound or drainage.  Neurological: Negative for syncope and numbness. other than noted Hematological: Negative for adenopathy. or other swelling Psychiatric/Behavioral: Negative for hallucinations, SI, self-injury, decreased concentration or other worsening agitation.      Objective:   Physical Exam BP 128/82 mmHg  Pulse 101  Temp(Src) 98.3 F (36.8 C) (Oral)  Ht 6\' 3"  (1.905 m)  Wt 288 lb (130.636 kg)  BMI 36.00 kg/m2  SpO2 96% VS noted, morbid obese Constitutional: Pt is oriented to person, place, and time. Appears well-developed and well-nourished,  in no significant distress Head: Normocephalic and atraumatic.  Right Ear: External ear normal.  Left Ear: External ear normal.  Nose: Nose normal.  Mouth/Throat: Oropharynx is clear and moist.  Eyes: Conjunctivae and EOM are normal. Pupils are equal, round, and reactive to light.  Neck: Normal range of motion. Neck supple.  No JVD present. No tracheal deviation present or significant neck LA or mass Cardiovascular: Normal rate, regular rhythm, normal heart sounds and intact distal pulses.   Pulmonary/Chest: Effort normal and breath sounds without rales or wheezing  Abdominal: Soft. Bowel sounds are normal. NT. No HSM  Musculoskeletal: Normal range of motion. Exhibits no edema.  Lymphadenopathy:  Has no cervical adenopathy.  Neurological: Pt is alert and oriented to person, place, and time. Pt has normal reflexes. No cranial nerve deficit. Motor grossly intact Skin: Skin is warm and dry. No rash noted.  Psychiatric:  Has normal mood and affect. Behavior is normal.     Assessment & Plan:

## 2015-05-24 NOTE — Patient Instructions (Addendum)
You had the flu shot today  Please take all new medication as prescribed - the Victoza as prescribed for weight loss (not diabetes)  Please continue all other medications as before, and refills have been done if requested.  Please have the pharmacy call with any other refills you may need.  Please continue your efforts at being more active, low cholesterol diet, and weight control.  You are otherwise up to date with prevention measures today.  Please keep your appointments with your specialists as you may have planned  Please go to the LAB in the Basement (turn left off the elevator) for the tests to be done today  You will be contacted by phone if any changes need to be made immediately.  Otherwise, you will receive a letter about your results with an explanation, but please check with MyChart first.  Please remember to sign up for MyChart if you have not done so, as this will be important to you in the future with finding out test results, communicating by private email, and scheduling acute appointments online when needed.  Please return in 6 months, or sooner if needed

## 2015-05-25 NOTE — Assessment & Plan Note (Signed)
Has tried phentermine/topamax combo in past, ok for victoza asd,  to f/u any worsening symptoms or concerns

## 2015-05-25 NOTE — Assessment & Plan Note (Signed)

## 2015-05-30 ENCOUNTER — Encounter: Payer: Self-pay | Admitting: Podiatry

## 2015-05-30 ENCOUNTER — Ambulatory Visit (INDEPENDENT_AMBULATORY_CARE_PROVIDER_SITE_OTHER): Payer: 59 | Admitting: Podiatry

## 2015-05-30 VITALS — BP 131/85 | HR 93 | Resp 16

## 2015-05-30 DIAGNOSIS — M722 Plantar fascial fibromatosis: Secondary | ICD-10-CM

## 2015-05-30 DIAGNOSIS — Z9889 Other specified postprocedural states: Secondary | ICD-10-CM

## 2015-05-31 NOTE — Progress Notes (Signed)
Subjective:     Patient ID: Robert Lloyd, male   DOB: July 14, 1970, 44 y.o.   MRN: [id number]  HPI patient states that my heels still hurts me some but it has improved from previously and I am able to walk better   Review of Systems     Objective:   Physical Exam Neurovascular status intact negative Homans sign noted with patient being 3 weeks after endoscopic release medial fascial band right with sutures intact and wound edges well coapted with mild pain upon plantar palpation to the arch    Assessment:     Patient is doing well post endoscopic surgery with normal amount of discomfort given the procedure and the length of time he had the problem    Plan:     Reviewed condition and advised on physical therapy and stitches removed with sterile dressings applied. Continued compression and boot usage is recommended for the next several weeks with gradual return to shoe gear but this will take several more months to heal completely and there may be discomfort during that period. He will return to work as he is able to tolerate shoes and weightbearing and hopefully this will be within the next 2-4 weeks depending on how quickly he responds to gradual shoe gear usage

## 2015-06-10 ENCOUNTER — Encounter: Payer: Self-pay | Admitting: Podiatry

## 2015-06-10 ENCOUNTER — Ambulatory Visit (INDEPENDENT_AMBULATORY_CARE_PROVIDER_SITE_OTHER): Payer: 59 | Admitting: Podiatry

## 2015-06-10 ENCOUNTER — Ambulatory Visit (INDEPENDENT_AMBULATORY_CARE_PROVIDER_SITE_OTHER): Payer: 59

## 2015-06-10 DIAGNOSIS — M722 Plantar fascial fibromatosis: Secondary | ICD-10-CM

## 2015-06-10 DIAGNOSIS — Z9889 Other specified postprocedural states: Secondary | ICD-10-CM | POA: Diagnosis not present

## 2015-06-10 MED ORDER — MELOXICAM 15 MG PO TABS: 15.0000 mg | ORAL_TABLET | Freq: Every day | ORAL | Status: DC

## 2015-06-10 NOTE — Progress Notes (Signed)
Subjective:     Patient ID: Robert Lloyd, male   DOB: [birth year], 44 y.o.   MRN: [id number]  HPI patient presents stating that my right foot is feeling quite a bit better but I'm still getting some pain in the arch and I wanted to make sure I have not had a loss of my arch height   Review of Systems     Objective:   Physical Exam Neurovascular status intact muscle strength was adequate with discomfort in the right arch with excellent healing of the incision site right plantar heel medial lateral side    Assessment:     Inflammatory changes with possible dropping of the arch    Plan:     X-ray reviewed with patient and at this point I recommended continued boot usage ice therapy and gradual return to work and placed back on mobile big 15 mg daily. Reappoint to recheck again in the next several months or earlier if any issues should occur

## 2015-06-13 ENCOUNTER — Encounter: Payer: Self-pay | Admitting: Podiatry

## 2015-06-13 NOTE — Progress Notes (Signed)
DOS 04-16-15  EPF right   Rx'd Vicodin 10/325 #20

## 2015-07-19 ENCOUNTER — Ambulatory Visit (INDEPENDENT_AMBULATORY_CARE_PROVIDER_SITE_OTHER): Payer: 59 | Admitting: Podiatry

## 2015-07-19 ENCOUNTER — Encounter: Payer: Self-pay | Admitting: Podiatry

## 2015-07-19 VITALS — BP 141/76 | HR 101 | Resp 18

## 2015-07-19 DIAGNOSIS — M79671 Pain in right foot: Secondary | ICD-10-CM

## 2015-07-19 MED ORDER — METHYLPREDNISOLONE 4 MG PO TBPK: ORAL_TABLET | ORAL | Status: DC

## 2015-07-22 ENCOUNTER — Encounter: Payer: Self-pay | Admitting: Podiatry

## 2015-07-22 NOTE — Progress Notes (Signed)
Patient ID: SYAIR TRAVAGLINI, male   DOB: 1970-09-05, 45 y.o.   MRN: [id number]  Subjective: QUANTAVIS RIVETT is a 45 y.o. is seen today in office s/p right EPF preformed on 04/16/15 with Dr. Paulla Dolly. He presents the office today for concerns of increased pain to a surgical for over the last couple days. He went back to work he feels that he went to stand. He had to stay out of work the last couple days due to the pain to his right foot. Denies any swelling or redness. No tingling or numbness. He states the area sore and tender. Denies any systemic complaints such as fevers, chills, nausea, vomiting. No calf pain, chest pain, shortness of breath.   Objective: General: No acute distress, AAOx3  DP/PT pulses palpable 2/4, CRT < 3 sec to all digits.  Protective sensation intact. Motor function intact.  Right foot: Incision is well coapted without any evidence of dehiscence and scars have performed. There is no surrounding erythema, ascending cellulitis, fluctuance, crepitus, malodor, drainage/purulence. There is minimal edema around the surgical site. There is pain along the surgical site on the plantar medial aspect of the right foot just distal to the calcaneal tubercle. There is no specific area pinpoint bony tenderness there is no pain vibratory sensation. No other areas of tenderness to bilateral lower extremities.  No other open lesions or pre-ulcerative lesions.  No pain with calf compression, swelling, warmth, erythema.   Assessment and Plan:  Status post right EPF, with increased pain   -Treatment options discussed including all alternatives, risks, and complications -Ice/elevation -Will start Medrol Dosepak. Once this is complete and return to anti-inflammatories. -Return to cam boot. -Pain medication as needed. -Work note provided  -Monitor for any clinical signs or symptoms of infection and DVT/PE and directed to call the office immediately should any occur or go to the ER. -Follow-up as  scheduled or sooner if any problems arise. In the meantime, encouraged to call the office with any questions, concerns, change in symptoms.   Celesta Gentile, DPM

## 2015-07-30 NOTE — Addendum Note (Signed)
Addended by: Lyman Bishop on: 07/30/2015 04:32 PM   Modules accepted: Orders

## 2015-07-31 ENCOUNTER — Other Ambulatory Visit (INDEPENDENT_AMBULATORY_CARE_PROVIDER_SITE_OTHER): Payer: 59

## 2015-07-31 ENCOUNTER — Ambulatory Visit: Payer: Self-pay | Admitting: Internal Medicine

## 2015-07-31 DIAGNOSIS — Z0000 Encounter for general adult medical examination without abnormal findings: Secondary | ICD-10-CM

## 2015-07-31 LAB — CBC WITH DIFFERENTIAL/PLATELET
Basophils Absolute: 0.1 10*3/uL (ref 0.0–0.1)
Basophils Relative: 1.1 % (ref 0.0–3.0)
EOS ABS: 0.1 10*3/uL (ref 0.0–0.7)
Eosinophils Relative: 2.5 % (ref 0.0–5.0)
HCT: 42.5 % (ref 39.0–52.0)
HEMOGLOBIN: 14.3 g/dL (ref 13.0–17.0)
LYMPHS ABS: 1.9 10*3/uL (ref 0.7–4.0)
Lymphocytes Relative: 36.3 % (ref 12.0–46.0)
MCHC: 33.6 g/dL (ref 30.0–36.0)
MCV: 85.2 fl (ref 78.0–100.0)
MONO ABS: 0.5 10*3/uL (ref 0.1–1.0)
MONOS PCT: 9.8 % (ref 3.0–12.0)
NEUTROS ABS: 2.7 10*3/uL (ref 1.4–7.7)
Neutrophils Relative %: 50.3 % (ref 43.0–77.0)
PLATELETS: 214 10*3/uL (ref 150.0–400.0)
RBC: 4.98 Mil/uL (ref 4.22–5.81)
RDW: 13.9 % (ref 11.5–15.5)
WBC: 5.3 10*3/uL (ref 4.0–10.5)

## 2015-07-31 LAB — URINALYSIS, ROUTINE W REFLEX MICROSCOPIC
BILIRUBIN URINE: NEGATIVE
KETONES UR: 15 — AB
LEUKOCYTES UA: NEGATIVE
NITRITE: NEGATIVE
RBC / HPF: NONE SEEN (ref 0–?)
Specific Gravity, Urine: 1.02 (ref 1.000–1.030)
Total Protein, Urine: NEGATIVE
URINE GLUCOSE: NEGATIVE
UROBILINOGEN UA: 0.2 (ref 0.0–1.0)
WBC UA: NONE SEEN (ref 0–?)
pH: 6 (ref 5.0–8.0)

## 2015-07-31 LAB — LIPID PANEL
CHOL/HDL RATIO: 8
Cholesterol: 246 mg/dL — ABNORMAL HIGH (ref 0–200)
HDL: 29.5 mg/dL — ABNORMAL LOW (ref >39.00)
LDL CALC: 195 mg/dL — AB (ref 0–99)
NONHDL: 216.54
Triglycerides: 107 mg/dL (ref 0.0–149.0)
VLDL: 21.4 mg/dL (ref 0.0–40.0)

## 2015-07-31 LAB — HEPATIC FUNCTION PANEL
ALT: 31 U/L (ref 0–53)
AST: 20 U/L (ref 0–37)
Albumin: 4.5 g/dL (ref 3.5–5.2)
Alkaline Phosphatase: 54 U/L (ref 39–117)
BILIRUBIN DIRECT: 0.1 mg/dL (ref 0.0–0.3)
BILIRUBIN TOTAL: 0.4 mg/dL (ref 0.2–1.2)
Total Protein: 7.1 g/dL (ref 6.0–8.3)

## 2015-07-31 LAB — BASIC METABOLIC PANEL
BUN: 17 mg/dL (ref 6–23)
CHLORIDE: 103 meq/L (ref 96–112)
CO2: 29 meq/L (ref 19–32)
Calcium: 9.5 mg/dL (ref 8.4–10.5)
Creatinine, Ser: 0.73 mg/dL (ref 0.40–1.50)
GFR: 123.57 mL/min (ref >60.00)
GLUCOSE: 102 mg/dL — AB (ref 70–99)
POTASSIUM: 4.3 meq/L (ref 3.5–5.1)
Sodium: 138 mEq/L (ref 135–145)

## 2015-07-31 LAB — PSA: PSA: 0.24 ng/mL (ref 0.10–4.00)

## 2015-07-31 LAB — TSH: TSH: 1.03 u[IU]/mL (ref 0.35–4.50)

## 2015-08-02 ENCOUNTER — Ambulatory Visit (INDEPENDENT_AMBULATORY_CARE_PROVIDER_SITE_OTHER): Payer: 59

## 2015-08-02 ENCOUNTER — Encounter: Payer: Self-pay | Admitting: Podiatry

## 2015-08-02 ENCOUNTER — Ambulatory Visit (INDEPENDENT_AMBULATORY_CARE_PROVIDER_SITE_OTHER): Payer: 59 | Admitting: Podiatry

## 2015-08-02 VITALS — BP 132/87 | HR 88 | Resp 16

## 2015-08-02 DIAGNOSIS — M779 Enthesopathy, unspecified: Secondary | ICD-10-CM | POA: Diagnosis not present

## 2015-08-02 DIAGNOSIS — M722 Plantar fascial fibromatosis: Secondary | ICD-10-CM

## 2015-08-02 DIAGNOSIS — M79671 Pain in right foot: Secondary | ICD-10-CM

## 2015-08-02 MED ORDER — TRIAMCINOLONE ACETONIDE 10 MG/ML IJ SUSP
10.0000 mg | Freq: Once | INTRAMUSCULAR | Status: AC
  Administered 2015-08-02: 10 mg

## 2015-08-05 NOTE — Progress Notes (Signed)
Subjective:     Patient ID: Robert Lloyd, male   DOB: 1970-11-25, 45 y.o.   MRN: [id number]  HPI patient states I'm doing well but I still can get some discomfort in my arch in the outside of my foot especially if I been a lot of walking. I am having trouble working a full shift   Review of Systems     Objective:   Physical Exam Neurovascular status intact negative Homans sign noted with well-healed surgical site plantar aspect of both feet there is found to be discomfort in the fifth metatarsal base right with inflammation fluid buildup that's painful when pressed and making walking difficult    Assessment:     Tendinitis right lateral foot still present which is probably compensatory in nature from having had the plantar fascial surgery    Plan:     Reviewed x-rays and did careful lateral injection 3 mg Kenalog 5 mill grams Xylocaine and advised on occasional boot usage along with physical therapy. Reappoint to recheck in 4 weeks  AP lateral views right indicate no signs of stress fracture or other pathology

## 2015-08-16 ENCOUNTER — Telehealth: Payer: Self-pay | Admitting: Internal Medicine

## 2015-08-16 MED ORDER — PRAVASTATIN SODIUM 40 MG PO TABS: 40.0000 mg | ORAL_TABLET | Freq: Every day | ORAL | Status: DC

## 2015-08-16 NOTE — Telephone Encounter (Signed)
Pt informed of labs. Labs mailed. Pt stated understanding.

## 2015-08-16 NOTE — Telephone Encounter (Signed)
Forwarding to PCP and to another provider in the office. Need results to be released in order to inform patient.   No notes from PCP in the lab results and no letter sent to patient regarding lab results.

## 2015-08-16 NOTE — Telephone Encounter (Signed)
Pt request lab result that was done on 07/31/15. Please call him back  # [phone]

## 2015-08-16 NOTE — Telephone Encounter (Signed)
Please release labs, no thyroid problems, PSA normal (negative), the cholesterol is severely high and would recommend to start a cholesterol medicine (have sent in pravastatin). Kidneys and liver are normal. Should see Dr. Jenny Reichmann 3 months after starting.

## 2015-08-26 ENCOUNTER — Ambulatory Visit (INDEPENDENT_AMBULATORY_CARE_PROVIDER_SITE_OTHER): Payer: 59 | Admitting: Internal Medicine

## 2015-08-26 ENCOUNTER — Encounter: Payer: Self-pay | Admitting: Internal Medicine

## 2015-08-26 VITALS — BP 124/76 | HR 74 | Temp 98.5°F | Resp 20 | Wt 274.0 lb

## 2015-08-26 DIAGNOSIS — R7302 Impaired glucose tolerance (oral): Secondary | ICD-10-CM | POA: Diagnosis not present

## 2015-08-26 DIAGNOSIS — I10 Essential (primary) hypertension: Secondary | ICD-10-CM

## 2015-08-26 DIAGNOSIS — N5239 Other post-surgical erectile dysfunction: Secondary | ICD-10-CM | POA: Diagnosis not present

## 2015-08-26 DIAGNOSIS — E785 Hyperlipidemia, unspecified: Secondary | ICD-10-CM

## 2015-08-26 DIAGNOSIS — N529 Male erectile dysfunction, unspecified: Secondary | ICD-10-CM | POA: Insufficient documentation

## 2015-08-26 MED ORDER — SILDENAFIL CITRATE 20 MG PO TABS: ORAL_TABLET | ORAL | Status: DC

## 2015-08-26 NOTE — Progress Notes (Signed)
Subjective:    Patient ID: Robert Lloyd, male    DOB: 1971/02/28, 45 y.o.   MRN: [id number]  HPI  Here to f/u recent elev chol and ED symtpoms  Pt denies chest pain, increased sob or doe, wheezing, orthopnea, PND, increased LE swelling, palpitations, dizziness or syncope.  Pt denies new neurological symptoms such as new headache, or facial or extremity weakness or numbness   Pt denies polydipsia, polyuria, or low sugar symptoms such as weakness or confusion improved with po intake.  Pt states overall good compliance with meds, trying to follow lower cholesterol, diabetic diet, wt overall stable but little exercise however.    Has lost with intentional wt loss with better diet. Did not start the victoza, never started the pravastatin.  Wt Readings from Last 3 Encounters:  08/26/15 274 lb (124.286 kg)  05/24/15 288 lb (130.636 kg)  10/30/14 268 lb (121.564 kg)  Did have hx of trial of statin years ago that caused muscle pain, but also seemed to make his ED worse.  Did try a friends sildanafil 20 mg - 3 pills, which worked great BP better with wt loss as well BP Readings from Last 3 Encounters:  08/26/15 124/76  08/02/15 132/87  07/19/15 141/76  Twin brother is > 300 lbs with similar health issues, Past Medical History  Diagnosis Date  . HYPERTENSION 04/10/2008    Qualifier: Diagnosis of  By: Loanne Drilling MD, Jacelyn Pi   . DEPRESSION 03/31/2007    Qualifier: Diagnosis of  By: Jenny Reichmann MD, Hunt Oris   . HYPERLIPIDEMIA 03/31/2007    Qualifier: Diagnosis of  By: Jenny Reichmann MD, Hunt Oris   . Obesity   . Anxiety state, unspecified 11/09/2012   Past Surgical History  Procedure Laterality Date  . Rotater cuff repair Right   . Hernia repair      infancy    reports that he has never smoked. He has never used smokeless tobacco. He reports that he drinks alcohol. He reports that he does not use illicit drugs. family history includes Hypertension in his mother. Allergies  Allergen Reactions  . Sulfonamide  Derivatives     UNKNOWN reaction   Current Outpatient Prescriptions on File Prior to Visit  Medication Sig Dispense Refill  . clonazePAM (KLONOPIN) 0.5 MG tablet Take 1 tablet (0.5 mg total) by mouth 2 (two) times daily as needed. 60 tablet 2  . zolpidem (AMBIEN CR) 12.5 MG CR tablet Take 1 tablet (12.5 mg total) by mouth at bedtime as needed for sleep. 90 tablet 1   No current facility-administered medications on file prior to visit.    Review of Systems  Constitutional: Negative for unusual diaphoresis or night sweats HENT: Negative for ringing in ear or discharge Eyes: Negative for double vision or worsening visual disturbance.  Respiratory: Negative for choking and stridor.   Gastrointestinal: Negative for vomiting or other signifcant bowel change Genitourinary: Negative for hematuria or change in urine volume.  Musculoskeletal: Negative for other MSK pain or swelling Skin: Negative for color change and worsening wound.  Neurological: Negative for tremors and numbness other than noted  Psychiatric/Behavioral: Negative for decreased concentration or agitation other than above       Objective:   Physical Exam BP 124/76 mmHg  Pulse 74  Temp(Src) 98.5 F (36.9 C) (Oral)  Resp 20  Wt 274 lb (124.286 kg)  SpO2 97% VS noted,  Constitutional: Pt appears in no significant distress HENT: Head: NCAT.  Right Ear: External ear normal.  Left Ear: External ear normal.  Eyes: . Pupils are equal, round, and reactive to light. Conjunctivae and EOM are normal Neck: Normal range of motion. Neck supple.  Cardiovascular: Normal rate and regular rhythm.   Pulmonary/Chest: Effort normal and breath sounds without rales or wheezing.  Abd:  Soft, NT, ND, + BS Neurological: Pt is alert. Not confused , motor grossly intact Skin: Skin is warm. No rash, no LE edema Psychiatric: Pt behavior is normal. No agitation.     Assessment & Plan:

## 2015-08-26 NOTE — Patient Instructions (Signed)
Please take all new medication as prescribed  - the generic viagra  Please continue all other medications as before, and refills have been done if requested.  Please have the pharmacy call with any other refills you may need.  Please continue your efforts at being more active, low cholesterol diet, and weight control.  Please keep your appointments with your specialists as you may have planned  Please go to the LAB in the Basement (turn left off the elevator) for the tests to be done in 3 MONTHS  You will be contacted by phone if any changes need to be made immediately.  Otherwise, you will receive a letter about your results with an explanation, but please check with MyChart first.  Please remember to sign up for MyChart if you have not done so, as this will be important to you in the future with finding out test results, communicating by private email, and scheduling acute appointments online when needed.

## 2015-08-26 NOTE — Assessment & Plan Note (Signed)
Mild intermittent symptoms , ok for generic revatio asd,  to f/u any worsening symptoms or concerns

## 2015-08-26 NOTE — Assessment & Plan Note (Addendum)
Likely improved with wt loss as well, asynmpt,  to f/u any worsening symptoms or concerns

## 2015-08-26 NOTE — Assessment & Plan Note (Signed)
Pt has declines statin for now, but has doing quite well with diet improvement and wt loss, to cont these efforts and f/u lipid in 3 mo

## 2015-08-26 NOTE — Progress Notes (Signed)
Pre visit review using our clinic review tool, if applicable. No additional management support is needed unless otherwise documented below in the visit note. 

## 2015-08-26 NOTE — Assessment & Plan Note (Signed)
Improved with wt loss o/w stable overall by history and exam, recent data reviewed with pt, and pt to continue medical treatment as before,  to f/u any worsening symptoms or concerns BP Readings from Last 3 Encounters:  08/26/15 124/76  08/02/15 132/87  07/19/15 141/76

## 2015-09-13 ENCOUNTER — Ambulatory Visit: Payer: 59 | Admitting: Podiatry

## 2015-09-26 ENCOUNTER — Other Ambulatory Visit: Payer: Self-pay | Admitting: Internal Medicine

## 2015-09-26 NOTE — Telephone Encounter (Signed)
Done hardcopy to Corinne  

## 2015-09-26 NOTE — Telephone Encounter (Signed)
Rx faxed to pharmacy  

## 2015-12-26 ENCOUNTER — Encounter: Payer: Self-pay | Admitting: Sports Medicine

## 2015-12-26 ENCOUNTER — Ambulatory Visit (INDEPENDENT_AMBULATORY_CARE_PROVIDER_SITE_OTHER): Payer: 59 | Admitting: Sports Medicine

## 2015-12-26 DIAGNOSIS — Z9889 Other specified postprocedural states: Secondary | ICD-10-CM

## 2015-12-26 DIAGNOSIS — M779 Enthesopathy, unspecified: Secondary | ICD-10-CM

## 2015-12-26 DIAGNOSIS — M79671 Pain in right foot: Secondary | ICD-10-CM

## 2015-12-26 DIAGNOSIS — G5751 Tarsal tunnel syndrome, right lower limb: Secondary | ICD-10-CM | POA: Diagnosis not present

## 2015-12-26 DIAGNOSIS — M25571 Pain in right ankle and joints of right foot: Secondary | ICD-10-CM

## 2015-12-26 MED ORDER — MELOXICAM 15 MG PO TABS: 15.0000 mg | ORAL_TABLET | Freq: Every day | ORAL | Status: DC

## 2015-12-26 MED ORDER — TRIAMCINOLONE ACETONIDE 10 MG/ML IJ SUSP: 10.0000 mg | Freq: Once | INTRAMUSCULAR | Status: DC

## 2015-12-26 NOTE — Progress Notes (Signed)
Patient ID: Robert Lloyd, male   DOB: [birth year], 45 y.o.   MRN: [id number] Subjective: Robert Lloyd is a 45 y.o. male patient who returns to office for evaluation of right foot and ankle pain. Patient complains of continued pain in the ankle. Admits that he is status post right EPF surgery and states that he keeps having pain along his distal arch and the outside of his right ankle states that over the last few weeks has picked up in intensity. Patient has tried using his small boot which helps a little bit and taking Motrin. However, pain still continues to persist. States that it hurts mostly with long hours at work and extensive standing and walking. Patient denies any other pedal complaints. Denies new injury/trip/fall/sprain/any causative factors.   Patient Active Problem List   Diagnosis Date Noted  . Erectile dysfunction 08/26/2015  . Seasonal affective disorder ([place]) 05/24/2015  . Morbid obesity ([place]) 05/24/2015  . 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 Right knee pain 10/19/2014  . Impaired glucose tolerance 03/09/2014  . Preventative health care 03/09/2014  . Anxiety state, unspecified 11/09/2012  . PAIN IN JOINT OTHER SPECIFIED SITES 05/23/2010  . BACK PAIN 09/10/2009  . KNEE PAIN, LEFT, CHRONIC 02/19/2009  . Cough 06/13/2008  . 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 PULMONARY NODULE 05/09/2008  . Overweight(278.02) 04/17/2008  . Essential hypertension 04/10/2008  . Headache(784.0) 04/10/2008  . Hyperlipidemia 03/31/2007  . DEPRESSION 03/31/2007  . 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 FATIGUE 03/31/2007  . INSOMNIA, HX OF 03/31/2007    Current Outpatient Prescriptions on File Prior to Visit  Medication Sig Dispense Refill  . clonazePAM (KLONOPIN) 0.5 MG tablet TAKE 1 TABLET BY MOUTH TWICE A DAY AS NEEDED 60 tablet 2  . sildenafil (REVATIO) 20 MG tablet Take 3 tabs by mouth daily as needed 60 tablet 5  . zolpidem (AMBIEN CR) 12.5 MG CR tablet  Take 1 tablet (12.5 mg total) by mouth at bedtime as needed for sleep. 90 tablet 1   No current facility-administered medications on file prior to visit.    Allergies  Allergen Reactions  . Sulfonamide Derivatives     UNKNOWN reaction    Objective:  General: Alert and oriented x3 in no acute distress  Dermatology: No open lesions bilateral lower extremities, no webspace macerations, no ecchymosis bilateral, all nails x 10 are well manicured.  Vascular: Dorsalis Pedis and Posterior Tibial pedal pulses palpable, Capillary Fill Time 3 seconds,(+) pedal hair growth bilateral, no edema bilateral lower extremities, Temperature gradient within normal limits.  Neurology: Johney Maine sensation intact via light touch bilateral. (- )Tinels sign bilateral.   Musculoskeletal: Moderate tenderness with palpation at sinus tarsi greater than distal arch and posterior tibial tendon on right. Negative talar tilt, Negative tib-fib stress, No instability. No pain with calf compression bilateral. Range of motion within normal limits with mild guarding on right ankle. Strength within normal limits in all groups bilateral.   Assessment and Plan: Problem List Items Addressed This Visit    None    Visit Diagnoses    Foot pain, right    -  Primary    Relevant Medications    triamcinolone acetonide (KENALOG) 10 MG/ML injection 10 mg (Start on 12/26/2015  3:00 PM)    meloxicam (MOBIC) 15 MG tablet    Sinus tarsi syndrome of right ankle        Relevant Medications    triamcinolone acetonide (KENALOG) 10 MG/ML injection 10 mg (  Start on 12/26/2015  3:00 PM)    meloxicam (MOBIC) 15 MG tablet    Tendonitis        Relevant Medications    meloxicam (MOBIC) 15 MG tablet    Post-operative state        Relevant Medications    meloxicam (MOBIC) 15 MG tablet       -Complete examination performed -Previous Xrays reviewed -Discussed treatement options for likely sinus tarsi syndrome with compensation pain status post  plantar fascial surgery After oral consent and aseptic prep, injected a mixture containing 1 ml of 2%  plain lidocaine, 1 ml 0.5% plain marcaine, 0.5 ml of kenalog 10 and 0.5 ml of dexamethasone phosphate into right sinus tarsi without complication. Post-injection care discussed with patient.  -Rx meloxicam to take as instructed. Patient declined steroid dose pack. States that he does not like the way oral steroids make him feel. -Dispensed to patient Tri-Lock ankle brace to use as instructed - Work note provided to patient for no work until 01/03/2016 to allow appropriate recuperation and rest to allow right foot and ankle symptoms to improve -Patient to return to office 1 month for follow-up evaluation  or sooner if condition worsens.  Landis Martins, DPM

## 2016-01-09 DIAGNOSIS — M779 Enthesopathy, unspecified: Secondary | ICD-10-CM

## 2016-01-27 ENCOUNTER — Encounter: Payer: 59 | Admitting: Sports Medicine

## 2016-02-11 ENCOUNTER — Encounter: Payer: Self-pay | Admitting: Sports Medicine

## 2016-02-11 ENCOUNTER — Ambulatory Visit (INDEPENDENT_AMBULATORY_CARE_PROVIDER_SITE_OTHER): Payer: 59 | Admitting: Sports Medicine

## 2016-02-11 VITALS — BP 128/87 | HR 85 | Resp 16

## 2016-02-11 DIAGNOSIS — M779 Enthesopathy, unspecified: Secondary | ICD-10-CM | POA: Diagnosis not present

## 2016-02-11 DIAGNOSIS — G5751 Tarsal tunnel syndrome, right lower limb: Secondary | ICD-10-CM

## 2016-02-11 DIAGNOSIS — M25571 Pain in right ankle and joints of right foot: Secondary | ICD-10-CM

## 2016-02-11 DIAGNOSIS — M79671 Pain in right foot: Secondary | ICD-10-CM

## 2016-02-11 MED ORDER — TRIAMCINOLONE ACETONIDE 40 MG/ML IJ SUSP: 20.0000 mg | Freq: Once | INTRAMUSCULAR | Status: DC

## 2016-02-11 MED ORDER — DICLOFENAC SODIUM 75 MG PO TBEC: 75.0000 mg | DELAYED_RELEASE_TABLET | Freq: Two times a day (BID) | ORAL | 0 refills | Status: DC

## 2016-02-11 NOTE — Progress Notes (Signed)
Patient ID: Robert Lloyd, male   DOB: [birth year], 45 y.o.   MRN: [id number] Subjective: Robert Lloyd is a 45 y.o. male patient who returns to office for evaluation of right foot and ankle pain. Patient states that brace helps for about 10 hours but afterwards have to take it off because bothers him in work shoes. States that he was going good but then over the last 2-3 day started having increased pain in foot worse with walking had to use post op shoe. Can not tell difference with Mobic. Patient denies any other pedal complaints.   Patient Active Problem List   Diagnosis Date Noted  . Erectile dysfunction 08/26/2015  . Seasonal affective disorder ([place]) 05/24/2015  . Morbid obesity ([place]) 05/24/2015  . 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 Right knee pain 10/19/2014  . Impaired glucose tolerance 03/09/2014  . Preventative health care 03/09/2014  . Anxiety state, unspecified 11/09/2012  . PAIN IN JOINT OTHER SPECIFIED SITES 05/23/2010  . BACK PAIN 09/10/2009  . KNEE PAIN, LEFT, CHRONIC 02/19/2009  . Cough 06/13/2008  . 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 PULMONARY NODULE 05/09/2008  . Overweight(278.02) 04/17/2008  . Essential hypertension 04/10/2008  . Headache(784.0) 04/10/2008  . Hyperlipidemia 03/31/2007  . DEPRESSION 03/31/2007  . 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 FATIGUE 03/31/2007  . INSOMNIA, HX OF 03/31/2007    Current Outpatient Prescriptions on File Prior to Visit  Medication Sig Dispense Refill  . clonazePAM (KLONOPIN) 0.5 MG tablet TAKE 1 TABLET BY MOUTH TWICE A DAY AS NEEDED 60 tablet 2  . meloxicam (MOBIC) 15 MG tablet Take 1 tablet (15 mg total) by mouth daily. 30 tablet 0  . sildenafil (REVATIO) 20 MG tablet Take 3 tabs by mouth daily as needed 60 tablet 5  . zolpidem (AMBIEN CR) 12.5 MG CR tablet Take 1 tablet (12.5 mg total) by mouth at bedtime as needed for sleep. 90 tablet 1   Current Facility-Administered Medications on File  Prior to Visit  Medication Dose Route Frequency Provider Last Rate Last Dose  . triamcinolone acetonide (KENALOG) 10 MG/ML injection 10 mg  10 mg Other Once Landis Martins, DPM        Allergies  Allergen Reactions  . Sulfonamide Derivatives     UNKNOWN reaction    Objective:  General: Alert and oriented x3 in no acute distress  Dermatology: No open lesions bilateral lower extremities, no webspace macerations, no ecchymosis bilateral, all nails x 10 are well manicured.  Vascular: Dorsalis Pedis and Posterior Tibial pedal pulses palpable, Capillary Fill Time 3 seconds,(+) pedal hair growth bilateral, no edema bilateral lower extremities, Temperature gradient within normal limits.  Neurology: Johney Maine sensation intact via light touch bilateral. (- )Tinels sign bilateral.   Musculoskeletal: Mild tenderness with palpation at sinus tarsi greater than distal arch/plantar fascia and posterior tibial tendon on right. Negative talar tilt, Negative tib-fib stress, No instability. No pain with calf compression bilateral. Range of motion within normal limits with mild guarding on right ankle. Strength within normal limits in all groups bilateral.   Assessment and Plan: Problem List Items Addressed This Visit    None    Visit Diagnoses    Sinus tarsi syndrome of right ankle    -  Primary   Relevant Medications   triamcinolone acetonide (KENALOG-40) injection 20 mg   diclofenac (VOLTAREN) 75 MG EC tablet   Tendonitis       Relevant Medications   triamcinolone acetonide (KENALOG-40) injection  20 mg   diclofenac (VOLTAREN) 75 MG EC tablet   Foot pain, right       Relevant Medications   triamcinolone acetonide (KENALOG-40) injection 20 mg   diclofenac (VOLTAREN) 75 MG EC tablet      -Complete examination performed -Previous Xrays reviewed -Discussed treatement options for likely sinus tarsi syndrome with compensation pain status post plantar fascial surgery After oral consent and aseptic prep,  injected a mixture containing 1 ml of 2%  plain lidocaine, 1 ml 0.5% plain marcaine, 0.5 ml of kenalog 40 and 0.5 ml of dexamethasone phosphate into right sinus tarsi, this is injection #2,  without complication. Post-injection care discussed with patient.  -Rx Diclofenac to take as instructed. -Continue with Tri-Lock ankle brace to use as instructed - Work note provided to patient for no work until 02/17/2016 to allow appropriate recuperation and rest to allow right foot and ankle symptoms to improve -Patient to return to office 5 weeks for follow-up evaluation  or sooner if condition worsens. If no improvement MRI.   Landis Martins, DPM

## 2016-03-11 ENCOUNTER — Telehealth: Payer: Self-pay | Admitting: *Deleted

## 2016-03-11 NOTE — Telephone Encounter (Signed)
Received request for #90 days of Diclofenac. Dr. Cannon Kettle instructed pt to make an follow up appt. Denial return faxed.

## 2016-03-17 ENCOUNTER — Encounter: Payer: Self-pay | Admitting: Internal Medicine

## 2016-03-17 ENCOUNTER — Other Ambulatory Visit (INDEPENDENT_AMBULATORY_CARE_PROVIDER_SITE_OTHER): Payer: 59

## 2016-03-17 ENCOUNTER — Telehealth: Payer: Self-pay | Admitting: Internal Medicine

## 2016-03-17 ENCOUNTER — Ambulatory Visit (INDEPENDENT_AMBULATORY_CARE_PROVIDER_SITE_OTHER): Payer: 59 | Admitting: Internal Medicine

## 2016-03-17 ENCOUNTER — Ambulatory Visit: Payer: 59 | Admitting: Sports Medicine

## 2016-03-17 VITALS — BP 140/82 | HR 82 | Temp 98.4°F | Resp 20 | Wt 276.1 lb

## 2016-03-17 DIAGNOSIS — R1011 Right upper quadrant pain: Secondary | ICD-10-CM

## 2016-03-17 DIAGNOSIS — Z23 Encounter for immunization: Secondary | ICD-10-CM | POA: Diagnosis not present

## 2016-03-17 DIAGNOSIS — I10 Essential (primary) hypertension: Secondary | ICD-10-CM

## 2016-03-17 LAB — CBC WITH DIFFERENTIAL/PLATELET
BASOS ABS: 0.1 10*3/uL (ref 0.0–0.1)
BASOS PCT: 0.7 % (ref 0.0–3.0)
EOS ABS: 0.2 10*3/uL (ref 0.0–0.7)
Eosinophils Relative: 2.6 % (ref 0.0–5.0)
HEMATOCRIT: 42.1 % (ref 39.0–52.0)
HEMOGLOBIN: 14.4 g/dL (ref 13.0–17.0)
LYMPHS PCT: 24.2 % (ref 12.0–46.0)
Lymphs Abs: 2.3 10*3/uL (ref 0.7–4.0)
MCHC: 34.1 g/dL (ref 30.0–36.0)
MCV: 85.1 fl (ref 78.0–100.0)
Monocytes Absolute: 1 10*3/uL (ref 0.1–1.0)
Monocytes Relative: 10.3 % (ref 3.0–12.0)
Neutro Abs: 6 10*3/uL (ref 1.4–7.7)
Neutrophils Relative %: 62.2 % (ref 43.0–77.0)
Platelets: 229 10*3/uL (ref 150.0–400.0)
RBC: 4.95 Mil/uL (ref 4.22–5.81)
RDW: 14 % (ref 11.5–15.5)
WBC: 9.7 10*3/uL (ref 4.0–10.5)

## 2016-03-17 LAB — BASIC METABOLIC PANEL
BUN: 16 mg/dL (ref 6–23)
CHLORIDE: 106 meq/L (ref 96–112)
CO2: 28 mEq/L (ref 19–32)
Calcium: 9.1 mg/dL (ref 8.4–10.5)
Creatinine, Ser: 0.76 mg/dL (ref 0.40–1.50)
GFR: 117.62 mL/min (ref >60.00)
GLUCOSE: 111 mg/dL — AB (ref 70–99)
POTASSIUM: 4 meq/L (ref 3.5–5.1)
Sodium: 140 mEq/L (ref 135–145)

## 2016-03-17 LAB — HEPATIC FUNCTION PANEL
ALBUMIN: 3.9 g/dL (ref 3.5–5.2)
ALK PHOS: 49 U/L (ref 39–117)
ALT: 24 U/L (ref 0–53)
AST: 15 U/L (ref 0–37)
Bilirubin, Direct: 0 mg/dL (ref 0.0–0.3)
Total Bilirubin: 0.3 mg/dL (ref 0.2–1.2)
Total Protein: 7.6 g/dL (ref 6.0–8.3)

## 2016-03-17 LAB — URINALYSIS, ROUTINE W REFLEX MICROSCOPIC
Bilirubin Urine: NEGATIVE
Ketones, ur: NEGATIVE
Leukocytes, UA: NEGATIVE
Nitrite: NEGATIVE
Specific Gravity, Urine: >=1.03 — AB
Total Protein, Urine: NEGATIVE
Urine Glucose: NEGATIVE
Urobilinogen, UA: 0.2
WBC, UA: NONE SEEN
pH: 5.5 (ref 5.0–8.0)

## 2016-03-17 LAB — LIPASE: Lipase: 33 U/L (ref 11.0–59.0)

## 2016-03-17 MED ORDER — TRAMADOL HCL 50 MG PO TABS: 50.0000 mg | ORAL_TABLET | Freq: Four times a day (QID) | ORAL | 0 refills | Status: DC | PRN

## 2016-03-17 NOTE — Progress Notes (Signed)
Pre visit review using our clinic review tool, if applicable. No additional management support is needed unless otherwise documented below in the visit note. 

## 2016-03-17 NOTE — Telephone Encounter (Signed)
.  one Done hardcopy to [employer]

## 2016-03-17 NOTE — Telephone Encounter (Signed)
Patient called in and said he does believe he will need something for pain. He is still having a lot of pain in his side. Please advise or follow up with patient. Thank you.

## 2016-03-17 NOTE — Progress Notes (Signed)
Subjective:    Patient ID: Robert Lloyd, male    DOB: 01-17-1971, 45 y.o.   MRN: [id number]  HPI  Here with 3 wks onset achy pain intermittent, usually mild to mod but occas severe, almost went to ER 2 days ago, located RUQ, with some radaition to the right back occas, no fever, n/v, reduced appetite, no unusual wt loss, and no stool change such as color or consistnecy. Nothing makes better or worse.  Father with hx of renal cell ca so he is concerned, has also hx of right adrenal enlargement to point of possible consideration for surgury if further enlarged.  Past Medical History:  Diagnosis Date  . Anxiety state, unspecified 11/09/2012  . DEPRESSION 03/31/2007   Qualifier: Diagnosis of  By: Jenny Reichmann MD, Hunt Oris   . HYPERLIPIDEMIA 03/31/2007   Qualifier: Diagnosis of  By: Jenny Reichmann MD, Hunt Oris   . HYPERTENSION 04/10/2008   Qualifier: Diagnosis of  By: Loanne Drilling MD, Jacelyn Pi Obesity    Past Surgical History:  Procedure Laterality Date  . HERNIA REPAIR     infancy  . rotater cuff repair Right     reports that he has never smoked. He has never used smokeless tobacco. He reports that he drinks alcohol. He reports that he does not use drugs. family history includes Hypertension in his mother. Allergies  Allergen Reactions  . Sulfonamide Derivatives     UNKNOWN reaction   Current Outpatient Prescriptions on File Prior to Visit  Medication Sig Dispense Refill  . clonazePAM (KLONOPIN) 0.5 MG tablet TAKE 1 TABLET BY MOUTH TWICE A DAY AS NEEDED 60 tablet 2  . diclofenac (VOLTAREN) 75 MG EC tablet Take 1 tablet (75 mg total) by mouth 2 (two) times daily. 60 tablet 0  . meloxicam (MOBIC) 15 MG tablet Take 1 tablet (15 mg total) by mouth daily. 30 tablet 0  . sildenafil (REVATIO) 20 MG tablet Take 3 tabs by mouth daily as needed 60 tablet 5  . zolpidem (AMBIEN CR) 12.5 MG CR tablet Take 1 tablet (12.5 mg total) by mouth at bedtime as needed for sleep. 90 tablet 1   Current Facility-Administered  Medications on File Prior to Visit  Medication Dose Route Frequency Provider Last Rate Last Dose  . triamcinolone acetonide (KENALOG) 10 MG/ML injection 10 mg  10 mg Other Once [employer], DPM      . triamcinolone acetonide (KENALOG-40) injection 20 mg  20 mg Other Once Landis Martins, DPM        Review of Systems  Constitutional: Negative for unusual diaphoresis or night sweats HENT: Negative for ear swelling or discharge Eyes: Negative for worsening visual haziness  Respiratory: Negative for choking and stridor.   Gastrointestinal: Negative for distension or worsening eructation Genitourinary: Negative for retention or change in urine volume.  Musculoskeletal: Negative for other MSK pain or swelling Skin: Negative for color change and worsening wound Neurological: Negative for tremors and numbness other than noted  Psychiatric/Behavioral: Negative for decreased concentration or agitation other than above       Objective:   Physical Exam BP 140/82   Pulse 82   Temp 98.4 F (36.9 C) (Oral)   Resp 20   Wt 276 lb 2 oz (125.2 kg)   SpO2 98%   BMI 34.51 kg/m  VS noted,  Constitutional: Pt appears in no apparent distress HENT: Head: NCAT.  Right Ear: External ear normal.  Left Ear: External ear normal.  Eyes: . Pupils  are equal, round, and reactive to light. Conjunctivae and EOM are normal Neck: Normal range of motion. Neck supple.  Cardiovascular: Normal rate and regular rhythm.   Pulmonary/Chest: Effort normal and breath sounds without rales or wheezing.  Abd:  Soft, NT, ND, + BS Neurological: Pt is alert. Not confused , motor grossly intact Skin: Skin is warm. No rash, no LE edema Psychiatric: Pt behavior is normal. No agitation.     Assessment & Plan:

## 2016-03-17 NOTE — Patient Instructions (Addendum)
You had the flu shot today  Please continue all other medications as before, and refills have been done if requested.  Please have the pharmacy call with any other refills you may need.  Please keep your appointments with your specialists as you may have planned  You will be contacted regarding the referral for: abdomen ultrasound (to see [facility] now)  Please go to the LAB in the Basement (turn left off the elevator) for the tests to be done today  You will be contacted by phone if any changes need to be made immediately.  Otherwise, you will receive a letter about your results with an explanation, but please check with MyChart first.  Please remember to sign up for MyChart if you have not done so, as this will be important to you in the future with finding out test results, communicating by private email, and scheduling acute appointments online when needed.  If the tests are negative, we may want to consider a HIDA scan for Gallbladder and/or Gastroenterology referral

## 2016-03-18 NOTE — Telephone Encounter (Signed)
faxed

## 2016-03-22 DIAGNOSIS — R1011 Right upper quadrant pain: Secondary | ICD-10-CM | POA: Insufficient documentation

## 2016-03-22 NOTE — Assessment & Plan Note (Signed)
stable overall by history and exam, recent data reviewed with pt, and pt to continue medical treatment as before,  to f/u any worsening symptoms or concerns BP Readings from Last 3 Encounters:  03/17/16 140/82  02/11/16 128/87  08/26/15 124/76

## 2016-03-22 NOTE — Assessment & Plan Note (Deleted)
No results found for: HGBA1C

## 2016-03-22 NOTE — Assessment & Plan Note (Signed)
Etiology unclear, mild to mod, cant r/o GB related, for abd u/s, labs as ordered today,  to f/u any worsening symptoms or concerns

## 2016-03-30 ENCOUNTER — Telehealth: Payer: Self-pay | Admitting: Emergency Medicine

## 2016-03-30 ENCOUNTER — Ambulatory Visit
Admission: RE | Admit: 2016-03-30 | Discharge: 2016-03-30 | Disposition: A | Discharge status: Home / Self Care | Payer: 59 | Source: Ambulatory Visit | Attending: Internal Medicine | Admitting: Internal Medicine

## 2016-03-30 DIAGNOSIS — R1011 Right upper quadrant pain: Secondary | ICD-10-CM

## 2016-03-30 NOTE — Telephone Encounter (Signed)
No concerning findings - can wait for Dr Jenny Reichmann to return.

## 2016-03-30 NOTE — Telephone Encounter (Signed)
Scheduling received call at lunch time for [place] of abdomen. States results are STAT. Will you please take a look at this since Dr Jenny Reichmann is out of the office today.    CB: [facility]

## 2016-03-31 ENCOUNTER — Encounter: Payer: Self-pay | Admitting: Internal Medicine

## 2016-04-24 ENCOUNTER — Other Ambulatory Visit: Payer: Self-pay | Admitting: Internal Medicine

## 2016-04-24 NOTE — Telephone Encounter (Signed)
Done hardcopy to Corinne  

## 2016-04-24 NOTE — Telephone Encounter (Signed)
faxed

## 2016-08-14 ENCOUNTER — Other Ambulatory Visit: Payer: Self-pay | Admitting: Internal Medicine

## 2016-08-14 NOTE — Telephone Encounter (Signed)
Faxed script bck to [facility].../lmb

## 2016-08-14 NOTE — Telephone Encounter (Signed)
Done hardcopy to Anna  

## 2016-09-30 ENCOUNTER — Ambulatory Visit: Payer: Self-pay | Admitting: Family Medicine

## 2016-10-23 ENCOUNTER — Encounter: Payer: Self-pay | Admitting: Family

## 2016-10-23 ENCOUNTER — Ambulatory Visit (INDEPENDENT_AMBULATORY_CARE_PROVIDER_SITE_OTHER)
Admission: RE | Admit: 2016-10-23 | Discharge: 2016-10-23 | Disposition: A | Discharge status: Home / Self Care | Payer: 59 | Source: Ambulatory Visit | Attending: Family | Admitting: Family

## 2016-10-23 ENCOUNTER — Ambulatory Visit (INDEPENDENT_AMBULATORY_CARE_PROVIDER_SITE_OTHER): Payer: 59 | Admitting: Family

## 2016-10-23 VITALS — BP 128/86 | HR 95 | Temp 98.4°F | Resp 16 | Ht 75.0 in | Wt 284.8 lb

## 2016-10-23 DIAGNOSIS — M545 Low back pain, unspecified: Secondary | ICD-10-CM

## 2016-10-23 MED ORDER — TIZANIDINE HCL 4 MG PO TABS: 4.0000 mg | ORAL_TABLET | Freq: Four times a day (QID) | ORAL | 0 refills | Status: DC | PRN

## 2016-10-23 MED ORDER — IBUPROFEN-FAMOTIDINE 800-26.6 MG PO TABS: 1.0000 | ORAL_TABLET | Freq: Three times a day (TID) | ORAL | 1 refills | Status: DC | PRN

## 2016-10-23 NOTE — Assessment & Plan Note (Addendum)
New onset low back pain following a fall from his truck secondary to wet surface. Physical exam reassuring and appears to be favoring contusion. Obtain x-ray to rule out fracture. Treat conservatively with ice, home exercise therapy, and start Duexis. Start tizanidine as needed for muscle spasm. Follow-up if symptoms worsen or fail to improve pending x-ray results. Work note provided secondary to pain and inability to complete tasks associated with his current employment.

## 2016-10-23 NOTE — Patient Instructions (Addendum)
Thank you for choosing [facility].  SUMMARY AND INSTRUCTIONS:  Ice x 20 minutes every 2 hours and after activity and before bed.  Muscle relaxor as needed.   Duexis - 1 tablet by mouth 3x per day for 5-7 days and then as needed.   Tylenol - 650 mg 3 x per day with Duexis   Stretches and exercises throughout the day.    Medication:  Your prescription(s) have been submitted to your pharmacy or been printed and provided for you. Please take as directed and contact our office if you believe you are having problem(s) with the medication(s) or have any questions.  Imaging / Radiology:  Please stop by radiology on the basement level of the building for your x-rays. Your results will be released to [facility] (or called to you) after review, usually within 72 hours after test completion. If any treatments or changes are necessary, you will be notified at that same time.  Follow up:  If your symptoms worsen or fail to improve, please contact our office for further instruction, or in case of emergency go directly to the emergency room at the closest medical facility.     Low Back Sprain Rehab Ask your health care provider which exercises are safe for you. Do exercises exactly as told by your health care provider and adjust them as directed. It is normal to feel mild stretching, pulling, tightness, or discomfort as you do these exercises, but you should stop right away if you feel sudden pain or your pain gets worse. Do not begin these exercises until told by your health care provider. Stretching and range of motion exercises These exercises warm up your muscles and joints and improve the movement and flexibility of your back. These exercises also help to relieve pain, numbness, and tingling. Exercise A: Lumbar rotation   1. Lie on your back on a firm surface and bend your knees. 2. Straighten your arms out to your sides so each arm forms an "L" shape with a side of your body (a 90  degree angle). 3. Slowly move both of your knees to one side of your body until you feel a stretch in your lower back. Try not to let your shoulders move off of the floor. 4. Hold for __________ seconds. 5. Tense your abdominal muscles and slowly move your knees back to the starting position. 6. Repeat this exercise on the other side of your body. Repeat __________ times. Complete this exercise __________ times a day. Exercise B: Prone extension on elbows   1. Lie on your abdomen on a firm surface. 2. Prop yourself up on your elbows. 3. Use your arms to help lift your chest up until you feel a gentle stretch in your abdomen and your lower back.  This will place some of your body weight on your elbows. If this is uncomfortable, try stacking pillows under your chest.  Your hips should stay down, against the surface that you are lying on. Keep your hip and back muscles relaxed. 4. Hold for __________ seconds. 5. Slowly relax your upper body and return to the starting position. Repeat __________ times. Complete this exercise __________ times a day. Strengthening exercises These exercises build strength and endurance in your back. Endurance is the ability to use your muscles for a long time, even after they get tired. Exercise C: Pelvic tilt  1. Lie on your back on a firm surface. Bend your knees and keep your feet flat. 2. Tense your abdominal muscles.  Tip your pelvis up toward the ceiling and flatten your lower back into the floor.  To help with this exercise, you may place a small towel under your lower back and try to push your back into the towel. 3. Hold for __________ seconds. 4. Let your muscles relax completely before you repeat this exercise. Repeat __________ times. Complete this exercise __________ times a day. Exercise D: Alternating arm and leg raises   1. Get on your hands and knees on a firm surface. If you are on a hard floor, you may want to use padding to cushion your  knees, such as an exercise mat. 2. Line up your arms and legs. Your hands should be below your shoulders, and your knees should be below your hips. 3. Lift your left leg behind you. At the same time, raise your right arm and straighten it in front of you.  Do not lift your leg higher than your hip.  Do not lift your arm higher than your shoulder.  Keep your abdominal and back muscles tight.  Keep your hips facing the ground.  Do not arch your back.  Keep your balance carefully, and do not hold your breath. 4. Hold for __________ seconds. 5. Slowly return to the starting position and repeat with your right leg and your left arm. Repeat __________ times. Complete this exercise __________ times a day. Exercise E: Abdominal set with straight leg raise   1. Lie on your back on a firm surface. 2. Bend one of your knees and keep your other leg straight. 3. Tense your abdominal muscles and lift your straight leg up, 4-6 inches (10-15 cm) off the ground. 4. Keep your abdominal muscles tight and hold for __________ seconds.  Do not hold your breath.  Do not arch your back. Keep it flat against the ground. 5. Keep your abdominal muscles tense as you slowly lower your leg back to the starting position. 6. Repeat with your other leg. Repeat __________ times. Complete this exercise __________ times a day. Posture and body mechanics   Body mechanics refers to the movements and positions of your body while you do your daily activities. Posture is part of body mechanics. Good posture and healthy body mechanics can help to relieve stress in your body's tissues and joints. Good posture means that your spine is in its natural S-curve position (your spine is neutral), your shoulders are pulled back slightly, and your head is not tipped forward. The following are general guidelines for applying improved posture and body mechanics to your everyday activities. Standing    When standing, keep your spine  neutral and your feet about hip-width apart. Keep a slight bend in your knees. Your ears, shoulders, and hips should line up.  When you do a task in which you stand in one place for a long time, place one foot up on a stable object that is 2-4 inches (5-10 cm) high, such as a footstool. This helps keep your spine neutral. Sitting    When sitting, keep your spine neutral and keep your feet flat on the floor. Use a footrest, if necessary, and keep your thighs parallel to the floor. Avoid rounding your shoulders, and avoid tilting your head forward.  When working at a desk or a computer, keep your desk at a height where your hands are slightly lower than your elbows. Slide your chair under your desk so you are close enough to maintain good posture.  When working at a computer, place  your monitor at a height where you are looking straight ahead and you do not have to tilt your head forward or downward to look at the screen. Resting    When lying down and resting, avoid positions that are most painful for you.  If you have pain with activities such as sitting, bending, stooping, or squatting (flexion-based activities), lie in a position in which your body does not bend very much. For example, avoid curling up on your side with your arms and knees near your chest (fetal position).  If you have pain with activities such as standing for a long time or reaching with your arms (extension-based activities), lie with your spine in a neutral position and bend your knees slightly. Try the following positions:  Lying on your side with a pillow between your knees.  Lying on your back with a pillow under your knees. Lifting    When lifting objects, keep your feet at least shoulder-width apart and tighten your abdominal muscles.  Bend your knees and hips and keep your spine neutral. It is important to lift using the strength of your legs, not your back. Do not lock your knees straight out.  Always ask  for help to lift heavy or awkward objects. This information is not intended to replace advice given to you by your health care provider. Make sure you discuss any questions you have with your health care provider. Document Released: 06/01/2005 Document Revised: 02/06/2016 Document Reviewed: 03/13/2015 Elsevier Interactive Patient Education  2017 [employer].

## 2016-10-23 NOTE — Progress Notes (Signed)
Subjective:    Patient ID: Robert Lloyd, male    DOB: 01-25-1971, 46 y.o.   MRN: [id number]  Chief Complaint  Patient presents with  . Back Pain    fell out of his truck last night and hit the cement, lower back and tailbone area and some in his shoulders is painful    HPI:  Robert Lloyd is a 46 y.o. male who  has a past medical history of Anxiety state, unspecified (11/09/2012); DEPRESSION (03/31/2007); HYPERLIPIDEMIA (03/31/2007); HYPERTENSION (04/10/2008); and Obesity. and presents today for an acute office visit.  This is a new problem. Associated symptom of pain located in his lower back and tailbone has been going on for about 1 day following a fall from his truck and landing on his lower. Denies any sounds/sensations heard or felt. Pain is described as pressure. Does have previous history of bulging discs. Modifying factors include ibuprofen which has not helped very much. Also used a heating pad last evening which did not help. No numbness or tingling. Not able to bend over well. No changes to bowel function or saddle anesthesia.  Allergies  Allergen Reactions  . Sulfonamide Derivatives     UNKNOWN reaction      Outpatient Medications Prior to Visit  Medication Sig Dispense Refill  . clonazePAM (KLONOPIN) 0.5 MG tablet TAKE 1 TABLET TWICE A DAY AS NEEDED 60 tablet 2  . zolpidem (AMBIEN CR) 12.5 MG CR tablet TAKE 1 TABLET BY MOUTH AT BEDTIME AS NEEDED FOR SLEEP 90 tablet 1  . diclofenac (VOLTAREN) 75 MG EC tablet Take 1 tablet (75 mg total) by mouth 2 (two) times daily. 60 tablet 0  . meloxicam (MOBIC) 15 MG tablet Take 1 tablet (15 mg total) by mouth daily. 30 tablet 0  . sildenafil (REVATIO) 20 MG tablet Take 3 tabs by mouth daily as needed 60 tablet 5  . traMADol (ULTRAM) 50 MG tablet Take 1 tablet (50 mg total) by mouth every 6 (six) hours as needed. 60 tablet 0   Facility-Administered Medications Prior to Visit  Medication Dose Route Frequency Provider Last  Rate Last Dose  . triamcinolone acetonide (KENALOG) 10 MG/ML injection 10 mg  10 mg Other Once Lyons, Titorya, DPM      . triamcinolone acetonide (KENALOG-40) injection 20 mg  20 mg Other Once Landis Martins, DPM          Past Surgical History:  Procedure Laterality Date  . HERNIA REPAIR     infancy  . rotater cuff repair Right       Past Medical History:  Diagnosis Date  . Anxiety state, unspecified 11/09/2012  . DEPRESSION 03/31/2007   Qualifier: Diagnosis of  By: Jenny Reichmann MD, Hunt Oris   . HYPERLIPIDEMIA 03/31/2007   Qualifier: Diagnosis of  By: Jenny Reichmann MD, Hunt Oris   . HYPERTENSION 04/10/2008   Qualifier: Diagnosis of  By: Loanne Drilling MD, Jacelyn Pi Obesity       Review of Systems  Constitutional: Negative for chills and fever.  Musculoskeletal: Positive for back pain. Negative for neck pain.  Neurological: Negative for dizziness, syncope, weakness and numbness.      Objective:    BP 128/86 (BP Location: Left Arm, Patient Position: Sitting, Cuff Size: Large)   Pulse 95   Temp 98.4 F (36.9 C) (Oral)   Resp 16   Ht 6\' 3"  (1.905 m)   Wt 284 lb 12.8 oz (129.2 kg)   SpO2 96%   BMI 35.60  kg/m  Nursing note and vital signs reviewed.  Physical Exam  Constitutional: He is oriented to person, place, and time. He appears well-developed and well-nourished. No distress.  Cardiovascular: Normal rate, regular rhythm, normal heart sounds and intact distal pulses.   Pulmonary/Chest: Effort normal and breath sounds normal.  Musculoskeletal:  Low back - no obvious deformity, discoloration, or edema. Palpable tenderness along lower thoracic spinous and transverse processes and upper lumbar spinous and transverse processes as well as paraspinal musculature. Range of motion restricted in flexion secondary to pain. All other motions are normal. Distal pulses and sensation are intact and appropriate. Positive straight leg raise on the right. Unable to complete Faber's.  Neurological: He is  alert and oriented to person, place, and time.  Skin: Skin is warm and dry.  Psychiatric: He has a normal mood and affect. His behavior is normal. Judgment and thought content normal.       Assessment & Plan:   Problem List Items Addressed This Visit      Other   Low back pain - Primary    New onset low back pain following a fall from his truck secondary to wet surface. Physical exam reassuring and appears to be favoring contusion. Obtain x-ray to rule out fracture. Treat conservatively with ice, home exercise therapy, and start Duexis. Start tizanidine as needed for muscle spasm. Follow-up if symptoms worsen or fail to improve pending x-ray results. Work note provided secondary to pain and inability to complete tasks associated with his current employment.      Relevant Medications   Ibuprofen-Famotidine 800-26.6 MG TABS   tiZANidine (ZANAFLEX) 4 MG tablet   Other Relevant Orders   DG Lumbar Spine Complete (Completed)       I have discontinued Mr. Nanninga's sildenafil, meloxicam, diclofenac, and traMADol. I am also having him start on Ibuprofen-Famotidine and tiZANidine. Additionally, I am having him maintain his clonazePAM and zolpidem. We will continue to administer triamcinolone acetonide and triamcinolone acetonide.   Meds ordered this encounter  Medications  . Ibuprofen-Famotidine 800-26.6 MG TABS    Sig: Take 1 tablet by mouth 3 (three) times daily as needed.    Dispense:  90 tablet    Refill:  1    Order Specific Question:   Supervising Provider    Answer:   Pricilla Holm A [[id number]]  . tiZANidine (ZANAFLEX) 4 MG tablet    Sig: Take 1 tablet (4 mg total) by mouth every 6 (six) hours as needed for muscle spasms.    Dispense:  30 tablet    Refill:  0    Order Specific Question:   Supervising Provider    Answer:   Pricilla Holm A [[id number]]     Follow-up: Return if symptoms worsen or fail to improve.  Mauricio Po, FNP

## 2016-10-29 ENCOUNTER — Ambulatory Visit (INDEPENDENT_AMBULATORY_CARE_PROVIDER_SITE_OTHER): Payer: 59 | Admitting: Internal Medicine

## 2016-10-29 ENCOUNTER — Encounter: Payer: Self-pay | Admitting: Internal Medicine

## 2016-10-29 DIAGNOSIS — R7302 Impaired glucose tolerance (oral): Secondary | ICD-10-CM

## 2016-10-29 DIAGNOSIS — M545 Low back pain, unspecified: Secondary | ICD-10-CM

## 2016-10-29 DIAGNOSIS — I10 Essential (primary) hypertension: Secondary | ICD-10-CM

## 2016-10-29 MED ORDER — METHYLPREDNISOLONE 4 MG PO TBPK: ORAL_TABLET | ORAL | 0 refills | Status: DC

## 2016-10-29 MED ORDER — TRAMADOL HCL 50 MG PO TABS: 50.0000 mg | ORAL_TABLET | Freq: Four times a day (QID) | ORAL | 1 refills | Status: DC | PRN

## 2016-10-29 NOTE — Assessment & Plan Note (Signed)
stable overall by history and exam, recent data reviewed with pt, and pt to continue medical treatment as before,  to f/u any worsening symptoms or concerns  

## 2016-10-29 NOTE — Assessment & Plan Note (Addendum)
Acute onset c/w MSK strain and likely aggrevation of underlyling lumbar DJD, DDD as well as probable mild right sided radiculitis - ok to change the zanaflex to klonopin bid prn, add tramadol prn, medrol pack x 1, and gave work note with hopeful return date of Mon may 21.  Would hold on further imaging at this time.  To consider gabapentin for worsening sciatica like symptoms if occurs; consider PT if not improving or sport med referral

## 2016-10-29 NOTE — Progress Notes (Signed)
Subjective:    Patient ID: Robert Lloyd, male    DOB: 31-Jul-1970, 46 y.o.   MRN: [id number]  HPI  Here after fall on last thur PM, slipped on slick concrete, and fell backwards, not sure what hit first but maybe left lower back area; knocked the breath out but did not hit head; bystanders were nearby and helped him after just minutes; Had immediate LBP, maybe worse on the left, has seen NP - see notes on that visit.  Currently having midand right lower back pain with radiation to the right calf, has some tingling to the right foot (and occas left foot) .  Was limping a bit yesterday with the right leg but seemed to improve rapidly and denies significant strenght loss.  Also incidentally had similar pain to right lwoer back and right leg years ago severe for some time but then resolved and not recurred (about 20 yrs ago).  Also had some achiness to the left should in last few days, but today seems to have little to no pain, and no neck pain or UE radicular symtpoms.  Had LS spine xray with mild lumbar DJD and DDD, no acute fracture.  No further falls.  Pt denies fever, wt loss, night sweats, loss of appetite, or other constitutional symptoms.  Did have a note per NP for out of work but just cant go back due to too much pain this am.  Pain overall worse to sitting in one position (sits sideways now for comfort), standing up is not painful but standing for several minutes leads to more pressure and pain on the left lower back, and has to lie down for best relief.  Has tried muscle relaxer but only puts him to sleep.  Has plenty of klonopin at home as has not needed recetnly Past Medical History:  Diagnosis Date  . Anxiety state, unspecified 11/09/2012  . DEPRESSION 03/31/2007   Qualifier: Diagnosis of  By: Jenny Reichmann MD, Hunt Oris   . HYPERLIPIDEMIA 03/31/2007   Qualifier: Diagnosis of  By: Jenny Reichmann MD, Hunt Oris   . HYPERTENSION 04/10/2008   Qualifier: Diagnosis of  By: Loanne Drilling MD, Jacelyn Pi Obesity    Past  Surgical History:  Procedure Laterality Date  . HERNIA REPAIR     infancy  . rotater cuff repair Right     reports that he has never smoked. He has never used smokeless tobacco. He reports that he drinks alcohol. He reports that he does not use drugs. family history includes Hypertension in his mother. Allergies  Allergen Reactions  . Sulfonamide Derivatives     UNKNOWN reaction   Current Outpatient Prescriptions on File Prior to Visit  Medication Sig Dispense Refill  . clonazePAM (KLONOPIN) 0.5 MG tablet TAKE 1 TABLET TWICE A DAY AS NEEDED 60 tablet 2  . Ibuprofen-Famotidine 800-26.6 MG TABS Take 1 tablet by mouth 3 (three) times daily as needed. 90 tablet 1  . tiZANidine (ZANAFLEX) 4 MG tablet Take 1 tablet (4 mg total) by mouth every 6 (six) hours as needed for muscle spasms. 30 tablet 0  . zolpidem (AMBIEN CR) 12.5 MG CR tablet TAKE 1 TABLET BY MOUTH AT BEDTIME AS NEEDED FOR SLEEP 90 tablet 1   Current Facility-Administered Medications on File Prior to Visit  Medication Dose Route Frequency Provider Last Rate Last Dose  . triamcinolone acetonide (KENALOG) 10 MG/ML injection 10 mg  10 mg Other Once Landis Martins, DPM      . triamcinolone  acetonide (KENALOG-40) injection 20 mg  20 mg Other Once Landis Martins, DPM       Review of Systems  Constitutional: Negative for other unusual diaphoresis or sweats HENT: Negative for ear discharge or swelling Eyes: Negative for other worsening visual disturbances Respiratory: Negative for stridor or other swelling  Gastrointestinal: Negative for worsening distension or other blood Genitourinary: Negative for retention or other urinary change Musculoskeletal: Negative for other MSK pain or swelling Skin: Negative for color change or other new lesions Neurological: Negative for worsening tremors and other numbness  Psychiatric/Behavioral: Negative for worsening agitation or other fatigue All other system neg per pt    Objective:    Physical Exam BP 126/78   Pulse 90   Ht 6\' 3"  (1.905 m)   Wt 282 lb (127.9 kg)   SpO2 99%   BMI 35.25 kg/m  VS noted, not ill appearing but uncomfortable Constitutional: Pt appears in NAD HENT: Head: NCAT.  Right Ear: External ear normal.  Left Ear: External ear normal.  Eyes: . Pupils are equal, round, and reactive to light. Conjunctivae and EOM are normal Nose: without d/c or deformity Neck: Neck supple. Gross normal ROM Cardiovascular: Normal rate and regular rhythm.   Pulmonary/Chest: Effort normal and breath sounds without rales or wheezing.  Abd:  Soft, NT, ND, + BS, no organomegaly + tender lowest midline lumbar spine, + tender bilat lumbar paravertebral spasm/tenderNeurological: Pt is alert. At baseline orientation, motor 5/5 intact, cn 2-12 intact, sens/dtr int5act Skin: Skin is warm. No rashes, other new lesions, no LE edema Psychiatric: Pt behavior is normal without agitation  No other exam findings  Lab Results  Component Value Date   WBC 9.7 03/17/2016   HGB 14.4 03/17/2016   HCT 42.1 03/17/2016   PLT 229.0 03/17/2016   GLUCOSE 111 (H) 03/17/2016   CHOL 246 (H) 07/31/2015   TRIG 107.0 07/31/2015   HDL 29.50 (L) 07/31/2015   LDLDIRECT 153.4 02/19/2009   LDLCALC 195 (H) 07/31/2015   ALT 24 03/17/2016   AST 15 03/17/2016   NA 140 03/17/2016   K 4.0 03/17/2016   CL 106 03/17/2016   CREATININE 0.76 03/17/2016   BUN 16 03/17/2016   CO2 28 03/17/2016   TSH 1.03 07/31/2015   PSA 0.24 07/31/2015   LUMBAR SPINE - COMPLETE 4+ VIEW 10/23/2016 - summary IMPRESSION: Mild degenerative disc disease is noted at L5-S1. No acute abnormality seen in the lumbar spine.     Assessment & Plan:

## 2016-10-29 NOTE — Assessment & Plan Note (Signed)
stable overall by history and exam, recent data reviewed with pt, and pt to continue medical treatment as before,  to f/u any worsening symptoms or concerns BP Readings from Last 3 Encounters:  10/29/16 126/78  10/23/16 128/86  03/17/16 140/82

## 2016-10-29 NOTE — Patient Instructions (Signed)
Please take all new medication as prescribed - the pain medication, and medrol pack  Ok to change the zanaflex to klonopin as needed for muscle spasm  Please continue all other medications as before, and refills have been done if requested.  Please have the pharmacy call with any other refills you may need.  Please keep your appointments with your specialists as you may have planned  You are given the work note

## 2016-11-04 ENCOUNTER — Ambulatory Visit (INDEPENDENT_AMBULATORY_CARE_PROVIDER_SITE_OTHER): Payer: 59 | Admitting: Internal Medicine

## 2016-11-04 ENCOUNTER — Telehealth: Payer: Self-pay | Admitting: Internal Medicine

## 2016-11-04 ENCOUNTER — Encounter: Payer: Self-pay | Admitting: Internal Medicine

## 2016-11-04 VITALS — BP 130/94 | HR 102 | Ht 75.0 in | Wt 285.0 lb

## 2016-11-04 DIAGNOSIS — M545 Low back pain, unspecified: Secondary | ICD-10-CM

## 2016-11-04 DIAGNOSIS — Z0001 Encounter for general adult medical examination with abnormal findings: Secondary | ICD-10-CM | POA: Diagnosis not present

## 2016-11-04 DIAGNOSIS — R7302 Impaired glucose tolerance (oral): Secondary | ICD-10-CM

## 2016-11-04 DIAGNOSIS — Z114 Encounter for screening for human immunodeficiency virus [HIV]: Secondary | ICD-10-CM

## 2016-11-04 DIAGNOSIS — I10 Essential (primary) hypertension: Secondary | ICD-10-CM

## 2016-11-04 NOTE — Patient Instructions (Signed)
Please continue all other medications as before, and refills have been done if requested.  Please have the pharmacy call with any other refills you may need.  Please continue your efforts at being more active, low cholesterol diet, and weight control.  You are otherwise up to date with prevention measures today.  Please keep your appointments with your specialists as you may have planned  You are given the new work note today  Please go to the LAB in the Basement (turn left off the elevator) for the tests to be done today  You will be contacted by phone if any changes need to be made immediately.  Otherwise, you will receive a letter about your results with an explanation, but please check with MyChart first.  Please remember to sign up for MyChart if you have not done so, as this will be important to you in the future with finding out test results, communicating by private email, and scheduling acute appointments online when needed.  Please return in 1 year for your yearly visit, or sooner if needed, with Lab testing done 3-5 days before

## 2016-11-04 NOTE — Telephone Encounter (Signed)
Spoke with Shirron about this. She had sent over the office notes for 5/11 & 5/17 this morning 5/23. After today visits she will send over the office notes for that appointment.   I called Roberta, No answer - I did leave a VM with this information. & To call back if she has any questions.

## 2016-11-04 NOTE — Progress Notes (Signed)
Subjective:    Patient ID: Robert Lloyd, male    DOB: [birth year], 46 y.o.   MRN: [id number]  HPI  Here for wellness and f/u;  Overall doing ok;  Pt denies Chest pain, worsening SOB, DOE, wheezing, orthopnea, PND, worsening LE edema, palpitations, dizziness or syncope.  Pt denies neurological change such as new headache, facial or extremity weakness.  Pt denies polydipsia, polyuria, or low sugar symptoms. Pt states overall good compliance with treatment and medications, good tolerability, and has been trying to follow appropriate diet.  Pt denies worsening depressive symptoms, suicidal ideation or panic. No fever, night sweats, wt loss, loss of appetite, or other constitutional symptoms.  Pt states good ability with ADL's, has low fall risk, home safety reviewed and adequate, no other significant changes in hearing or vision, and only occasionally active with exercise.  Has hx of lumbar DDD/djd Overall back pain improved but not completely resolved off work, then went back to work may 21, but by end of sihft pain was coming back, became even more gradaully worse over last 2 days, was told he could not take tramadol at work, iburpfen didn't seem to help.  Worse pain overall with work.  Also had some nausea for several hours with taking the ibuprofen. Has mobic leftover at home post foot surgury.  Was finally not able to go to work last PM due to pain.  Overall is overal similar in character jsut as it was last visit, including the right sided radicular like pain, but not assoc with worsening right leg weakness or numbness; leg pain overall worse to sit only a few minutes, then better to stand. Pain origianlly started about 2 wks ago.  Pt denies fever, wt loss, night sweats, loss of appetite, or other constitutional symptoms No falls since the initial one.  Has next 2 days off, supposed to work next on sat may 26.   Past Medical History:  Diagnosis Date  . Anxiety state, unspecified 11/09/2012  .  DEPRESSION 03/31/2007   Qualifier: Diagnosis of  By: Jenny Reichmann MD, Hunt Oris   . HYPERLIPIDEMIA 03/31/2007   Qualifier: Diagnosis of  By: Jenny Reichmann MD, Hunt Oris   . HYPERTENSION 04/10/2008   Qualifier: Diagnosis of  By: Loanne Drilling MD, Jacelyn Pi Obesity    Past Surgical History:  Procedure Laterality Date  . HERNIA REPAIR     infancy  . rotater cuff repair Right     reports that he has never smoked. He has never used smokeless tobacco. He reports that he drinks alcohol. He reports that he does not use drugs. family history includes Hypertension in his mother. Allergies  Allergen Reactions  . Sulfonamide Derivatives     UNKNOWN reaction   Current Outpatient Prescriptions on File Prior to Visit  Medication Sig Dispense Refill  . clonazePAM (KLONOPIN) 0.5 MG tablet TAKE 1 TABLET TWICE A DAY AS NEEDED 60 tablet 2  . Ibuprofen-Famotidine 800-26.6 MG TABS Take 1 tablet by mouth 3 (three) times daily as needed. 90 tablet 1  . methylPREDNISolone (MEDROL) 4 MG TBPK tablet Take as directed 21 tablet 0  . tiZANidine (ZANAFLEX) 4 MG tablet Take 1 tablet (4 mg total) by mouth every 6 (six) hours as needed for muscle spasms. 30 tablet 0  . traMADol (ULTRAM) 50 MG tablet Take 1 tablet (50 mg total) by mouth every 6 (six) hours as needed. 40 tablet 1  . zolpidem (AMBIEN CR) 12.5 MG CR tablet TAKE 1 TABLET BY  MOUTH AT BEDTIME AS NEEDED FOR SLEEP 90 tablet 1   Current Facility-Administered Medications on File Prior to Visit  Medication Dose Route Frequency Provider Last Rate Last Dose  . triamcinolone acetonide (KENALOG) 10 MG/ML injection 10 mg  10 mg Other Once [place], Titorya, DPM      . triamcinolone acetonide (KENALOG-40) injection 20 mg  20 mg Other Once Landis Martins, DPM       Review of Systems Constitutional: Negative for other unusual diaphoresis, sweats, appetite or weight changes HENT: Negative for other worsening hearing loss, ear pain, facial swelling, mouth sores or neck stiffness.   Eyes:  Negative for other worsening pain, redness or other visual disturbance.  Respiratory: Negative for other stridor or swelling Cardiovascular: Negative for other palpitations or other chest pain  Gastrointestinal: Negative for worsening diarrhea or loose stools, blood in stool, distention or other pain Genitourinary: Negative for hematuria, flank pain or other change in urine volume.  Musculoskeletal: Negative for myalgias or other joint swelling.  Skin: Negative for other color change, or other wound or worsening drainage.  Neurological: Negative for other syncope or numbness. Hematological: Negative for other adenopathy or swelling Psychiatric/Behavioral: Negative for hallucinations, other worsening agitation, SI, self-injury, or new decreased concentration All other system neg per pt    Objective:   Physical Exam BP (!) 130/94   Pulse (!) 102   Ht 6\' 3"  (1.905 m)   Wt 285 lb (129.3 kg)   SpO2 99%   BMI 35.62 kg/m  VS noted,  Constitutional: Pt is oriented to person, place, and time. Appears well-developed and well-nourished, in no significant distress and comfortable Head: Normocephalic and atraumatic  Eyes: Conjunctivae and EOM are normal. Pupils are equal, round, and reactive to light Right Ear: External ear normal without discharge Left Ear: External ear normal without discharge Nose: Nose without discharge or deformity Mouth/Throat: Oropharynx is without other ulcerations and moist  Neck: Normal range of motion. Neck supple. No JVD present. No tracheal deviation present or significant neck LA or mass Cardiovascular: Normal rate, regular rhythm, normal heart sounds and intact distal pulses.   Pulmonary/Chest: WOB normal and breath sounds without rales or wheezing  Abdominal: Soft. Bowel sounds are normal. NT. No HSM  Musculoskeletal: Normal range of motion. Exhibits no edema + mild tender lowest midline lumbar spine, + mild tender bilat lumbar paravertebral  spasm/tenderNeurological: Pt is alert. At baseline orientation, motor 5/5 intact, cn 2-12 intact, sens/dtr intact Lymphadenopathy: Has no other cervical adenopathy.  Neurological: Pt is alert and oriented to person, place, and time. Pt has normal reflexes. No cranial nerve deficit. Motor grossly intact, Gait intact Skin: Skin is warm and dry. No rash noted or new ulcerations Psychiatric:  Has normal mood and affect. Behavior is normal without agitation No other exam findings       Assessment & Plan:

## 2016-11-04 NOTE — Telephone Encounter (Signed)
Angelita Ingles called from patiens place of employment. They are needing office notes for 5/11 & 5/17 & today visit 5/23. They are for this disability. Patient & Angelita Ingles have spoke with Medical records, [employer]. She informed them that they have sent a form over to Dr. Jenny Reichmann to sign to be able to release this information. Have you seen this forms?    [place]   Fax # [phone]

## 2016-11-05 NOTE — Assessment & Plan Note (Signed)
stable overall by history and exam, recent data reviewed with pt, and pt to continue medical treatment as before,  to f/u any worsening symptoms or concerns BP Readings from Last 3 Encounters:  11/04/16 (!) 130/94  10/29/16 126/78  10/23/16 128/86

## 2016-11-05 NOTE — Telephone Encounter (Signed)
Robert Lloyd called again wanting the office notes from the Pts visit yesterday.

## 2016-11-05 NOTE — Assessment & Plan Note (Signed)

## 2016-11-05 NOTE — Assessment & Plan Note (Addendum)
Improved subjectively but not resolved, to cont current meds as before,work not given  In addition to the time spent performing CPE, I spent an additional 15 minutes face to face,in which greater than 50% of this time was spent in counseling and coordination of care for patient's illness as documented, including the differential diagnosis, evaluation and management of lower back pain, HTN and hyperglycemia

## 2016-11-05 NOTE — Telephone Encounter (Signed)
I spoke with Robert Lloyd and informed her that the note is not complete. I let her know yesterday was a busy day and quite a few notes from afternoon patients was not completed and signed and stated that Robert Lloyd's was one of them. I told her if it is not faxed by today at closing she should be receiving it by the end of day tomorrow. She wanted me to send what was done already but I declined and told her that I would rather fax it when it has been completed and signed off.

## 2016-11-05 NOTE — Assessment & Plan Note (Signed)
stable overall by history and exam, recent data reviewed with pt, and pt to continue medical treatment as before,  to f/u any worsening symptoms or concerns No results found for: HGBA1C

## 2016-12-14 ENCOUNTER — Other Ambulatory Visit: Payer: Self-pay | Admitting: Internal Medicine

## 2017-01-07 DIAGNOSIS — Z0279 Encounter for issue of other medical certificate: Secondary | ICD-10-CM

## 2017-01-29 ENCOUNTER — Other Ambulatory Visit: Payer: Self-pay | Admitting: Family

## 2017-04-04 DIAGNOSIS — Z0000 Encounter for general adult medical examination without abnormal findings: Secondary | ICD-10-CM | POA: Diagnosis not present

## 2017-04-04 DIAGNOSIS — R399 Unspecified symptoms and signs involving the genitourinary system: Secondary | ICD-10-CM | POA: Diagnosis not present

## 2017-04-08 ENCOUNTER — Emergency Department (HOSPITAL_COMMUNITY): Payer: 59

## 2017-04-08 ENCOUNTER — Emergency Department (HOSPITAL_COMMUNITY)
Admission: EM | Admit: 2017-04-08 | Discharge: 2017-04-09 | Disposition: A | Discharge status: Home / Self Care | Payer: 59 | Attending: Emergency Medicine | Admitting: Emergency Medicine

## 2017-04-08 DIAGNOSIS — Z79899 Other long term (current) drug therapy: Secondary | ICD-10-CM | POA: Insufficient documentation

## 2017-04-08 DIAGNOSIS — R748 Abnormal levels of other serum enzymes: Secondary | ICD-10-CM | POA: Diagnosis not present

## 2017-04-08 DIAGNOSIS — R109 Unspecified abdominal pain: Secondary | ICD-10-CM | POA: Diagnosis not present

## 2017-04-08 DIAGNOSIS — N200 Calculus of kidney: Secondary | ICD-10-CM | POA: Insufficient documentation

## 2017-04-08 DIAGNOSIS — I10 Essential (primary) hypertension: Secondary | ICD-10-CM | POA: Insufficient documentation

## 2017-04-08 DIAGNOSIS — R1084 Generalized abdominal pain: Secondary | ICD-10-CM | POA: Insufficient documentation

## 2017-04-08 DIAGNOSIS — R399 Unspecified symptoms and signs involving the genitourinary system: Secondary | ICD-10-CM | POA: Diagnosis not present

## 2017-04-08 DIAGNOSIS — R300 Dysuria: Secondary | ICD-10-CM | POA: Diagnosis not present

## 2017-04-08 LAB — URINALYSIS, ROUTINE W REFLEX MICROSCOPIC
BACTERIA UA: NONE SEEN
BILIRUBIN URINE: NEGATIVE
Glucose, UA: NEGATIVE mg/dL
Ketones, ur: NEGATIVE mg/dL
Leukocytes, UA: NEGATIVE
Nitrite: NEGATIVE
Protein, ur: NEGATIVE mg/dL
SPECIFIC GRAVITY, URINE: 1.015 (ref 1.005–1.030)
Squamous Epithelial / LPF: NONE SEEN
pH: 5 (ref 5.0–8.0)

## 2017-04-08 MED ORDER — OXYCODONE-ACETAMINOPHEN 5-325 MG PO TABS
2.0000 | ORAL_TABLET | Freq: Once | ORAL | Status: AC
  Administered 2017-04-09: 2 via ORAL
  Filled 2017-04-08: qty 2

## 2017-04-08 NOTE — ED Triage Notes (Signed)
Pt is currently being treated for a kidney infection since Sunday. Pt reports having frequent urination with little urine output. Pt reports taking 500 milligrams of Cipro. Currently still taking antibiotic. Pr reports having back pain a level 10 on a scale of 0 to 10.

## 2017-04-09 MED ORDER — ONDANSETRON 4 MG PO TBDP: 4.0000 mg | ORAL_TABLET | Freq: Three times a day (TID) | ORAL | 0 refills | Status: DC | PRN

## 2017-04-09 MED ORDER — OXYCODONE-ACETAMINOPHEN 5-325 MG PO TABS: 2.0000 | ORAL_TABLET | ORAL | 0 refills | Status: DC | PRN

## 2017-04-09 MED ORDER — KETOROLAC TROMETHAMINE 60 MG/2ML IM SOLN
60.0000 mg | Freq: Once | INTRAMUSCULAR | Status: AC
  Administered 2017-04-09: 60 mg via INTRAMUSCULAR
  Filled 2017-04-09: qty 2

## 2017-04-09 MED ORDER — TAMSULOSIN HCL 0.4 MG PO CAPS: 0.4000 mg | ORAL_CAPSULE | Freq: Every day | ORAL | 0 refills | Status: DC

## 2017-04-09 NOTE — Discharge Instructions (Signed)
Return if any problems.

## 2017-04-09 NOTE — ED Provider Notes (Signed)
[facility] EMERGENCY DEPARTMENT Provider Note   CSN: [id number] Arrival date & time: 04/08/17  2222     History   Chief Complaint Chief Complaint  Patient presents with  . Flank Pain    HPI Robert Lloyd is a 46 y.o. male.  The history is provided by the patient.  Flank Pain  This is a new problem. The problem occurs constantly. The problem has not changed since onset.Pertinent negatives include no chest pain. Nothing aggravates the symptoms. Nothing relieves the symptoms. He has tried nothing for the symptoms. The treatment provided no relief.  Pt reports he has had discomfort with urination since Sunday.  Pt reports he went to urgent care on   Past Medical History:  Diagnosis Date  . Anxiety state, unspecified 11/09/2012  . DEPRESSION 03/31/2007   Qualifier: Diagnosis of  By: Jenny Reichmann MD, Hunt Oris   . HYPERLIPIDEMIA 03/31/2007   Qualifier: Diagnosis of  By: Jenny Reichmann MD, Hunt Oris   . HYPERTENSION 04/10/2008   Qualifier: Diagnosis of  By: Loanne Drilling MD, Jacelyn Pi Obesity     Patient Active Problem List   Diagnosis Date Noted  . RUQ pain 03/22/2016  . Erectile dysfunction 08/26/2015  . Seasonal affective disorder ([place]) 05/24/2015  . Morbid obesity ([place]) 05/24/2015  . 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 Right knee pain 10/19/2014  . Impaired glucose tolerance 03/09/2014  . Encounter for well adult exam with abnormal findings 03/09/2014  . Anxiety state, unspecified 11/09/2012  . PAIN IN JOINT OTHER SPECIFIED SITES 05/23/2010  . Low back pain 09/10/2009  . KNEE PAIN, LEFT, CHRONIC 02/19/2009  . Cough 06/13/2008  . 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 PULMONARY NODULE 05/09/2008  . Overweight(278.02) 04/17/2008  . Essential hypertension 04/10/2008  . Headache(784.0) 04/10/2008  . Hyperlipidemia 03/31/2007  . DEPRESSION 03/31/2007  . 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 FATIGUE 03/31/2007  . INSOMNIA, HX OF 03/31/2007    Past Surgical  History:  Procedure Laterality Date  . HERNIA REPAIR     infancy  . rotater cuff repair Right        Home Medications    Prior to Admission medications   Medication Sig Start Date End Date Taking? Authorizing Provider  ciprofloxacin (CIPRO) 500 MG tablet Take 500 mg by mouth 2 (two) times daily. 04/04/17  Yes [provider]  clonazePAM (KLONOPIN) 0.5 MG tablet TAKE 1 TABLET BY MOUTH TWICE A DAY AS NEEDED Patient taking differently: TAKE 1 TABLET BY MOUTH TWICE A DAY AS NEEDED FOR ANXIETY 12/15/16  Yes Biagio Borg, MD  DUEXIS 800-26.6 MG TABS Take 1 tablet by mouth 3 (three) times daily as needed. Patient taking differently: Take 1 tablet by mouth 3 (three) times daily as needed (for pain).  01/29/17  Yes Biagio Borg, MD  zolpidem (AMBIEN CR) 12.5 MG CR tablet TAKE 1 TABLET BY MOUTH AT BEDTIME AS NEEDED FOR SLEEP 08/14/16  Yes Biagio Borg, MD    Family History Family History  Problem Relation Age of Onset  . Hypertension Mother     Social History Social History  Substance Use Topics  . Smoking status: Never Smoker  . Smokeless tobacco: Never Used  . Alcohol use 0.0 oz/week     Comment: occasionally     Allergies   Sulfonamide derivatives   Review of Systems Review of Systems  Cardiovascular: Negative for chest pain.  Genitourinary: Positive for flank pain.  All other systems  reviewed and are negative.    Physical Exam Updated Vital Signs BP (!) 170/110   Pulse (!) 101   Temp 97.6 F (36.4 C)   Resp 20   Ht 6\' 3"  (1.905 m)   Wt 127 kg (280 lb)   SpO2 97%   BMI 35.00 kg/m   Physical Exam  Constitutional: He appears well-developed and well-nourished.  HENT:  Head: Normocephalic and atraumatic.  Eyes: Conjunctivae are normal.  Neck: Neck supple.  Cardiovascular: Normal rate and regular rhythm.   No murmur heard. Pulmonary/Chest: Effort normal and breath sounds normal. No respiratory distress.  Abdominal: Soft. There is no tenderness.    Tender right flank  Musculoskeletal: He exhibits no edema.  Neurological: He is alert.  Skin: Skin is warm and dry.  Psychiatric: He has a normal mood and affect.  Nursing note and vitals reviewed.    ED Treatments / Results  Labs (all labs ordered are listed, but only abnormal results are displayed) Labs Reviewed  URINALYSIS, ROUTINE W REFLEX MICROSCOPIC - Abnormal; Notable for the following:       Result Value   Hgb urine dipstick MODERATE (*)    All other components within normal limits  URINE CULTURE    EKG  EKG Interpretation None       Radiology Ct Renal Stone Study  Result Date: 04/09/2017 CLINICAL DATA:  Acute onset of increased urinary frequency and decreased urinary output. Right flank pain. Initial encounter. EXAM: CT ABDOMEN AND PELVIS WITHOUT CONTRAST TECHNIQUE: Multidetector CT imaging of the abdomen and pelvis was performed following the standard protocol without IV contrast. COMPARISON:  CT of the abdomen performed 11/22/2010, and abdominal ultrasound performed 03/30/2016 FINDINGS: Lower chest: The visualized lung bases are grossly clear. The visualized portions of the mediastinum are unremarkable. Hepatobiliary: There is fatty infiltration within the liver, with mild sparing about the gallbladder fossa. The common bile duct remains normal in caliber. The gallbladder is unremarkable in appearance. Pancreas: The pancreas is within normal limits. Spleen: The spleen is unremarkable in appearance. Adrenals/Urinary Tract: A 3.5 cm right adrenal adenoma is noted. The left adrenal gland is unremarkable There is mild prominence of the right ureter along its entire course, with surrounding soft tissue inflammation. No significant hydronephrosis is seen. This appears to reflect an obstructing 7 x 4 mm stone at the distal right ureter, 1 cm above the right vesicoureteral junction. Nonspecific perinephric stranding is noted bilaterally. A small left renal cyst is seen. No  nonobstructing renal stones are identified. Stomach/Bowel: The stomach is unremarkable in appearance. The small bowel is within normal limits. The appendix is normal in caliber, without evidence of appendicitis. The colon is unremarkable in appearance. Vascular/Lymphatic: Minimal calcification is noted at the aortic bifurcation. No retroperitoneal or pelvic sidewall lymphadenopathy is seen. Reproductive: The bladder is decompressed and not well assessed. The prostate remains normal in size. Other: No additional soft tissue abnormalities are seen. Musculoskeletal: No acute osseous abnormalities are identified. Vacuum phenomenon is noted at L5-S1. The visualized musculature is unremarkable in appearance. IMPRESSION: 1. Mild prominence of the right ureter, with surrounding soft tissue inflammation. No significant hydronephrosis seen. This appears to reflect an obstructing 7 x 4 mm stone at the distal right ureter, 1 cm above the right vesicoureteral junction. 2. Fatty infiltration within the liver. 3. 3.5 cm right adrenal adenoma incidentally noted. 4. Small left renal cyst. Electronically Signed   By: Garald Balding M.D.   On: 04/09/2017 00:08    Procedures Procedures (including  critical care time)  Medications Ordered in ED Medications  oxyCODONE-acetaminophen (PERCOCET/ROXICET) 5-325 MG per tablet 2 tablet (2 tablets Oral Given 04/09/17 0002)     Initial Impression / Assessment and Plan / ED Course  I have reviewed the triage vital signs and the nursing notes.  Pertinent labs & imaging results that were available during my care of the patient were reviewed by me and considered in my medical decision making (see chart for details).     Pt counseled on kidney stones.  Pt advised to follow up with Urology for evaluation   Final Clinical Impressions(s) / ED Diagnoses   Final diagnoses:  Kidney stone    New Prescriptions New Prescriptions   ONDANSETRON (ZOFRAN ODT) 4 MG DISINTEGRATING TABLET     Take 1 tablet (4 mg total) by mouth every 8 (eight) hours as needed for nausea or vomiting.   OXYCODONE-ACETAMINOPHEN (PERCOCET/ROXICET) 5-325 MG TABLET    Take 2 tablets by mouth every 4 (four) hours as needed for severe pain.   TAMSULOSIN (FLOMAX) 0.4 MG CAPS CAPSULE    Take 1 capsule (0.4 mg total) by mouth daily.  An After Visit Summary was printed and given to the patient.    Fransico Meadow, PA-C 04/09/17 Leandrew Koyanagi    Rolland Porter, MD 04/09/17 [phone]

## 2017-04-10 LAB — URINE CULTURE
CULTURE: NO GROWTH
Special Requests: NORMAL

## 2017-04-14 DIAGNOSIS — R399 Unspecified symptoms and signs involving the genitourinary system: Secondary | ICD-10-CM | POA: Diagnosis not present

## 2017-04-14 DIAGNOSIS — R109 Unspecified abdominal pain: Secondary | ICD-10-CM | POA: Diagnosis not present

## 2017-04-14 DIAGNOSIS — N200 Calculus of kidney: Secondary | ICD-10-CM | POA: Diagnosis not present

## 2017-04-14 DIAGNOSIS — N201 Calculus of ureter: Secondary | ICD-10-CM | POA: Diagnosis not present

## 2017-04-20 DIAGNOSIS — N201 Calculus of ureter: Secondary | ICD-10-CM | POA: Diagnosis not present

## 2017-04-26 DIAGNOSIS — N201 Calculus of ureter: Secondary | ICD-10-CM | POA: Diagnosis not present

## 2017-05-01 ENCOUNTER — Other Ambulatory Visit: Payer: Self-pay | Admitting: Internal Medicine

## 2017-05-03 NOTE — Telephone Encounter (Signed)
Done erx 

## 2017-05-05 DIAGNOSIS — E538 Deficiency of other specified B group vitamins: Secondary | ICD-10-CM | POA: Diagnosis not present

## 2017-05-05 DIAGNOSIS — R1084 Generalized abdominal pain: Secondary | ICD-10-CM | POA: Diagnosis not present

## 2017-05-05 DIAGNOSIS — R109 Unspecified abdominal pain: Secondary | ICD-10-CM | POA: Diagnosis not present

## 2017-05-05 DIAGNOSIS — Z23 Encounter for immunization: Secondary | ICD-10-CM | POA: Diagnosis not present

## 2017-05-19 DIAGNOSIS — N201 Calculus of ureter: Secondary | ICD-10-CM | POA: Diagnosis not present

## 2017-05-25 DIAGNOSIS — N201 Calculus of ureter: Secondary | ICD-10-CM | POA: Diagnosis not present

## 2017-05-26 DIAGNOSIS — N201 Calculus of ureter: Secondary | ICD-10-CM | POA: Diagnosis not present

## 2017-10-18 DIAGNOSIS — R1084 Generalized abdominal pain: Secondary | ICD-10-CM | POA: Diagnosis not present

## 2017-10-21 ENCOUNTER — Other Ambulatory Visit: Payer: Self-pay | Admitting: Internal Medicine

## 2017-10-21 NOTE — Telephone Encounter (Signed)
Done erx 

## 2017-10-21 NOTE — Telephone Encounter (Signed)
05/01/2017  60# 

## 2018-03-01 ENCOUNTER — Telehealth: Payer: Self-pay | Admitting: Internal Medicine

## 2018-03-02 NOTE — Telephone Encounter (Signed)
Pt called to make an appt for 03/10/18 @ 8am.

## 2018-03-02 NOTE — Telephone Encounter (Signed)
ambien and clonazepam not authorized due to office refill policy, pt last seen may 2018

## 2018-03-10 ENCOUNTER — Ambulatory Visit: Payer: Self-pay | Admitting: Internal Medicine

## 2018-03-15 ENCOUNTER — Encounter: Payer: Self-pay | Admitting: Internal Medicine

## 2018-03-15 ENCOUNTER — Other Ambulatory Visit: Payer: Self-pay | Admitting: Internal Medicine

## 2018-03-15 ENCOUNTER — Ambulatory Visit: Payer: 59 | Admitting: Internal Medicine

## 2018-03-15 ENCOUNTER — Other Ambulatory Visit (INDEPENDENT_AMBULATORY_CARE_PROVIDER_SITE_OTHER): Payer: 59

## 2018-03-15 VITALS — BP 142/94 | HR 100 | Temp 98.1°F | Ht 75.0 in | Wt 288.0 lb

## 2018-03-15 DIAGNOSIS — Z23 Encounter for immunization: Secondary | ICD-10-CM | POA: Diagnosis not present

## 2018-03-15 DIAGNOSIS — R7302 Impaired glucose tolerance (oral): Secondary | ICD-10-CM

## 2018-03-15 DIAGNOSIS — Z0000 Encounter for general adult medical examination without abnormal findings: Secondary | ICD-10-CM | POA: Diagnosis not present

## 2018-03-15 DIAGNOSIS — Z114 Encounter for screening for human immunodeficiency virus [HIV]: Secondary | ICD-10-CM

## 2018-03-15 DIAGNOSIS — F411 Generalized anxiety disorder: Secondary | ICD-10-CM | POA: Diagnosis not present

## 2018-03-15 LAB — BASIC METABOLIC PANEL
BUN: 16 mg/dL (ref 6–23)
CALCIUM: 9.9 mg/dL (ref 8.4–10.5)
CO2: 29 meq/L (ref 19–32)
Chloride: 103 mEq/L (ref 96–112)
Creatinine, Ser: 0.88 mg/dL (ref 0.40–1.50)
GFR: 98.45 mL/min (ref >60.00)
Glucose, Bld: 108 mg/dL — ABNORMAL HIGH (ref 70–99)
POTASSIUM: 4 meq/L (ref 3.5–5.1)
SODIUM: 139 meq/L (ref 135–145)

## 2018-03-15 LAB — CBC WITH DIFFERENTIAL/PLATELET
Basophils Absolute: 0.1 10*3/uL (ref 0.0–0.1)
Basophils Relative: 1 % (ref 0.0–3.0)
Eosinophils Absolute: 0.1 10*3/uL (ref 0.0–0.7)
Eosinophils Relative: 1.4 % (ref 0.0–5.0)
HCT: 43.6 % (ref 39.0–52.0)
Hemoglobin: 14.6 g/dL (ref 13.0–17.0)
LYMPHS ABS: 2.3 10*3/uL (ref 0.7–4.0)
Lymphocytes Relative: 27 % (ref 12.0–46.0)
MCHC: 33.6 g/dL (ref 30.0–36.0)
MCV: 86.1 fl (ref 78.0–100.0)
MONO ABS: 0.7 10*3/uL (ref 0.1–1.0)
Monocytes Relative: 8.4 % (ref 3.0–12.0)
NEUTROS PCT: 62.2 % (ref 43.0–77.0)
Neutro Abs: 5.4 10*3/uL (ref 1.4–7.7)
Platelets: 235 10*3/uL (ref 150.0–400.0)
RBC: 5.06 Mil/uL (ref 4.22–5.81)
RDW: 13.8 % (ref 11.5–15.5)
WBC: 8.7 10*3/uL (ref 4.0–10.5)

## 2018-03-15 LAB — LDL CHOLESTEROL, DIRECT: Direct LDL: 160 mg/dL

## 2018-03-15 LAB — PSA: PSA: 0.2 ng/mL (ref 0.10–4.00)

## 2018-03-15 LAB — LIPID PANEL
Cholesterol: 243 mg/dL — ABNORMAL HIGH (ref 0–200)
HDL: 35.4 mg/dL — ABNORMAL LOW (ref >39.00)
NonHDL: 207.42
TRIGLYCERIDES: 296 mg/dL — AB (ref 0.0–149.0)
Total CHOL/HDL Ratio: 7
VLDL: 59.2 mg/dL — AB (ref 0.0–40.0)

## 2018-03-15 LAB — HEPATIC FUNCTION PANEL
ALBUMIN: 4.3 g/dL (ref 3.5–5.2)
ALT: 26 U/L (ref 0–53)
AST: 18 U/L (ref 0–37)
Alkaline Phosphatase: 64 U/L (ref 39–117)
Bilirubin, Direct: 0 mg/dL (ref 0.0–0.3)
Total Bilirubin: 0.3 mg/dL (ref 0.2–1.2)
Total Protein: 7.3 g/dL (ref 6.0–8.3)

## 2018-03-15 LAB — URINALYSIS, ROUTINE W REFLEX MICROSCOPIC
BILIRUBIN URINE: NEGATIVE
KETONES UR: NEGATIVE
LEUKOCYTES UA: NEGATIVE
NITRITE: NEGATIVE
Specific Gravity, Urine: 1.02 (ref 1.000–1.030)
Total Protein, Urine: NEGATIVE
URINE GLUCOSE: NEGATIVE
UROBILINOGEN UA: 0.2 (ref 0.0–1.0)
pH: 6.5 (ref 5.0–8.0)

## 2018-03-15 LAB — HEMOGLOBIN A1C: Hgb A1c MFr Bld: 6 % (ref 4.6–6.5)

## 2018-03-15 LAB — TSH: TSH: 1.1 u[IU]/mL (ref 0.35–4.50)

## 2018-03-15 MED ORDER — CITALOPRAM HYDROBROMIDE 10 MG PO TABS: 10.0000 mg | ORAL_TABLET | Freq: Every day | ORAL | 3 refills | Status: DC

## 2018-03-15 MED ORDER — ROSUVASTATIN CALCIUM 20 MG PO TABS: 20.0000 mg | ORAL_TABLET | Freq: Every day | ORAL | 3 refills | Status: DC

## 2018-03-15 MED ORDER — CLONAZEPAM 0.5 MG PO TABS: ORAL_TABLET | ORAL | 5 refills | Status: DC

## 2018-03-15 MED ORDER — ZOLPIDEM TARTRATE ER 12.5 MG PO TBCR: 12.5000 mg | EXTENDED_RELEASE_TABLET | Freq: Every evening | ORAL | 1 refills | Status: DC | PRN

## 2018-03-15 NOTE — Progress Notes (Signed)
Subjective:    Patient ID: Robert Lloyd, male    DOB: 1971/05/01, 47 y.o.   MRN: [id number]  HPI  Here for wellness and f/u;  Overall doing ok;  Pt denies Chest pain, worsening SOB, DOE, wheezing, orthopnea, PND, worsening LE edema, palpitations, dizziness or syncope.  Pt denies neurological change such as new headache, facial or extremity weakness.  Pt denies polydipsia, polyuria, or low sugar symptoms. Pt states overall good compliance with treatment and medications, good tolerability, and has been trying to follow appropriate diet.  Pt denies worsening depressive symptoms, suicidal ideation or panic, but would like to have better control over chronic anxiety. No fever, night sweats, wt loss, loss of appetite, or other constitutional symptoms.  Pt states good ability with ADL's, has low fall risk, home safety reviewed and adequate, no other significant changes in hearing or vision, and only occasionally active with exercise.  BP at home usually < 140/90 Past Medical History:  Diagnosis Date  . Anxiety state, unspecified 11/09/2012  . DEPRESSION 03/31/2007   Qualifier: Diagnosis of  By: Jenny Reichmann MD, Hunt Oris   . HYPERLIPIDEMIA 03/31/2007   Qualifier: Diagnosis of  By: Jenny Reichmann MD, Hunt Oris   . HYPERTENSION 04/10/2008   Qualifier: Diagnosis of  By: Loanne Drilling MD, Jacelyn Pi Obesity    Past Surgical History:  Procedure Laterality Date  . HERNIA REPAIR     infancy  . rotater cuff repair Right     reports that he has never smoked. He has never used smokeless tobacco. He reports that he drinks alcohol. He reports that he does not use drugs. family history includes Hypertension in his mother. Allergies  Allergen Reactions  . Sulfonamide Derivatives     UNKNOWN reaction   Current Outpatient Medications on File Prior to Visit  Medication Sig Dispense Refill  . DUEXIS 800-26.6 MG TABS Take 1 tablet by mouth 3 (three) times daily as needed. (Patient taking differently: Take 1 tablet by mouth 3 (three)  times daily as needed (for pain). ) 90 tablet 0   No current facility-administered medications on file prior to visit.    Review of Systems Constitutional: Negative for other unusual diaphoresis, sweats, appetite or weight changes HENT: Negative for other worsening hearing loss, ear pain, facial swelling, mouth sores or neck stiffness.   Eyes: Negative for other worsening pain, redness or other visual disturbance.  Respiratory: Negative for other stridor or swelling Cardiovascular: Negative for other palpitations or other chest pain  Gastrointestinal: Negative for worsening diarrhea or loose stools, blood in stool, distention or other pain Genitourinary: Negative for hematuria, flank pain or other change in urine volume.  Musculoskeletal: Negative for myalgias or other joint swelling.  Skin: Negative for other color change, or other wound or worsening drainage.  Neurological: Negative for other syncope or numbness. Hematological: Negative for other adenopathy or swelling Psychiatric/Behavioral: Negative for hallucinations, other worsening agitation, SI, self-injury, or new decreased concentration All other system neg per pt    Objective:   Physical Exam BP (!) 142/94   Pulse 100   Temp 98.1 F (36.7 C) (Oral)   Ht 6\' 3"  (1.905 m)   Wt 288 lb (130.6 kg)   SpO2 96%   BMI 36.00 kg/m  VS noted,  Constitutional: Pt is oriented to person, place, and time. Appears well-developed and well-nourished, in no significant distress and comfortable Head: Normocephalic and atraumatic  Eyes: Conjunctivae and EOM are normal. Pupils are equal, round, and reactive to  light Right Ear: External ear normal without discharge Left Ear: External ear normal without discharge Nose: Nose without discharge or deformity Mouth/Throat: Oropharynx is without other ulcerations and moist  Neck: Normal range of motion. Neck supple. No JVD present. No tracheal deviation present or significant neck LA or  mass Cardiovascular: Normal rate, regular rhythm, normal heart sounds and intact distal pulses.   Pulmonary/Chest: WOB normal and breath sounds without rales or wheezing  Abdominal: Soft. Bowel sounds are normal. NT. No HSM  Musculoskeletal: Normal range of motion. Exhibits no edema Lymphadenopathy: Has no other cervical adenopathy.  Neurological: Pt is alert and oriented to person, place, and time. Pt has normal reflexes. No cranial nerve deficit. Motor grossly intact, Gait intact Skin: Skin is warm and dry. No rash noted or new ulcerations Psychiatric:  Has normal mood and affect. Behavior is normal without agitation, 1+ nervous Lab Results  Component Value Date   WBC 8.7 03/15/2018   HGB 14.6 03/15/2018   HCT 43.6 03/15/2018   PLT 235.0 03/15/2018   GLUCOSE 108 (H) 03/15/2018   CHOL 243 (H) 03/15/2018   TRIG 296.0 (H) 03/15/2018   HDL 35.40 (L) 03/15/2018   LDLDIRECT 160.0 03/15/2018   LDLCALC 195 (H) 07/31/2015   ALT 26 03/15/2018   AST 18 03/15/2018   NA 139 03/15/2018   K 4.0 03/15/2018   CL 103 03/15/2018   CREATININE 0.88 03/15/2018   BUN 16 03/15/2018   CO2 29 03/15/2018   TSH 1.10 03/15/2018   PSA 0.20 03/15/2018   HGBA1C 6.0 03/15/2018        Assessment & Plan:

## 2018-03-15 NOTE — Assessment & Plan Note (Signed)
stable overall by history and exam, recent data reviewed with pt, and pt to continue medical treatment as before,  to f/u any worsening symptoms or concerns Lab Results  Component Value Date   HGBA1C 6.0 03/15/2018

## 2018-03-15 NOTE — Assessment & Plan Note (Signed)
Ok for celexa 10 qd 

## 2018-03-15 NOTE — Assessment & Plan Note (Signed)

## 2018-03-15 NOTE — Patient Instructions (Addendum)
You had the flu shot today  Please take all new medication as prescribed - the celexa 10 mg per day (and call for higher dose in 1 month if not working so well)  Please continue all other medications as before, and refills have been done if requested - the ambien and klonopin  Please have the pharmacy call with any other refills you may need.  Please continue your efforts at being more active, low cholesterol diet, and weight control.  You are otherwise up to date with prevention measures today.  Please keep your appointments with your specialists as you may have planned  Please go to the LAB in the Basement (turn left off the elevator) for the tests to be done today  You will be contacted by phone if any changes need to be made immediately.  Otherwise, you will receive a letter about your results with an explanation, but please check with MyChart first.  Please remember to sign up for MyChart if you have not done so, as this will be important to you in the future with finding out test results, communicating by private email, and scheduling acute appointments online when needed.  Please return in 6 months, or sooner if needed

## 2018-03-16 ENCOUNTER — Telehealth: Payer: Self-pay

## 2018-03-16 LAB — HIV ANTIBODY (ROUTINE TESTING W REFLEX): HIV: NONREACTIVE

## 2018-03-16 NOTE — Telephone Encounter (Signed)
Pt has been informed of results and expressed understanding.  °

## 2018-03-16 NOTE — Telephone Encounter (Signed)
-----   Message from Biagio Borg, MD sent at 03/15/2018  7:20 PM EDT ----- Left message on MyChart, pt to cont same tx except  The test results show that your current treatment is OK, except the LDL cholesterol is elevated.  We should start a medication called Crestor 20 mg per day to get this down and reduce your future risk of heart and stroke disease.  I will send the prescription, and you should hear from the office as well.    Shirron to please inform pt, I will do rx

## 2018-06-21 ENCOUNTER — Ambulatory Visit: Payer: Self-pay

## 2018-06-21 NOTE — Telephone Encounter (Signed)
Ok to try to take qod

## 2018-06-21 NOTE — Telephone Encounter (Signed)
Patient called and says that since he started taking Crestor, he has been feeling weak with no energy. He says the symptoms started about 2 weeks after taking the medicine. He says he has to make an extra effort to do anything, he has no energy, feels drained at work and felt wiped out today after going to the gym for a workout. He says he was taking it in the evening, then switched to before bed for 2 weeks, but didn't notice a difference, so he went back to taking it early evening. He says a few days ago, he started taking CoQ10 to see if that would help anything. He asks is this what happens with cholesterol medication? I advised that I will send his concerns to Dr. Jenny Reichmann and someone will call back with his recommendation.  Reason for Disposition . Caller has NON-URGENT medication question about med that PCP prescribed and triager unable to answer question  Protocols used: MEDICATION QUESTION CALL-A-AH

## 2018-06-22 NOTE — Telephone Encounter (Signed)
Called pt, LVM with details below  

## 2018-06-24 DIAGNOSIS — J111 Influenza due to unidentified influenza virus with other respiratory manifestations: Secondary | ICD-10-CM | POA: Diagnosis not present

## 2018-06-28 ENCOUNTER — Ambulatory Visit: Payer: 59 | Admitting: Internal Medicine

## 2018-06-28 ENCOUNTER — Encounter: Payer: Self-pay | Admitting: Internal Medicine

## 2018-06-28 DIAGNOSIS — J069 Acute upper respiratory infection, unspecified: Secondary | ICD-10-CM | POA: Insufficient documentation

## 2018-06-28 DIAGNOSIS — B9789 Other viral agents as the cause of diseases classified elsewhere: Secondary | ICD-10-CM | POA: Diagnosis not present

## 2018-06-28 MED ORDER — METHYLPREDNISOLONE ACETATE 40 MG/ML IJ SUSP
40.0000 mg | Freq: Once | INTRAMUSCULAR | Status: AC
  Administered 2018-06-28: 40 mg via INTRAMUSCULAR

## 2018-06-28 MED ORDER — HYDROCODONE-HOMATROPINE 5-1.5 MG/5ML PO SYRP: 5.0000 mL | ORAL_SOLUTION | Freq: Every evening | ORAL | 0 refills | Status: DC | PRN

## 2018-06-28 NOTE — Assessment & Plan Note (Signed)
Given depo-medrol 40 mg IM and rx for hycodan cough medicine. Reviewed [place] database and no inappropriate fills. Advised not to take with ambien. Advised to take zyrtec daily. No further antibiotics indicated.

## 2018-06-28 NOTE — Patient Instructions (Signed)
We have given you the shot today to start the lungs.   We have sent in cough medicine to use in the evening. Do not take and drive.   Start taking zyrtec (cetirizine) over the counter to help dry the drainage up.

## 2018-06-28 NOTE — Progress Notes (Signed)
   Subjective:   Patient ID: Robert Lloyd, male    DOB: [birth year], 48 y.o.   MRN: [id number]  HPI The patient is a 48 y.o. man coming in for cold symptoms. Started about 1 week ago. Main symptoms are: cough, fatigue, sinus drainage and headaces. Denies SOB, nausea, body aches. Was seen at an urgent care facility about 4-5 days ago and given rx for tamiflu and z-pack. He did take both of those. No flu testing was done. Overall it is stable and not improving. Has tried z-pack and tamiflu which he does not think helped much. Denies sick contacts. Some green sputum intermittent and some chills at the beginning. Not sleeping well due to cough and sent home for looking sick from work today and needs work note.   Review of Systems  Constitutional: Positive for activity change, appetite change and chills. Negative for fatigue, fever and unexpected weight change.  HENT: Positive for congestion, postnasal drip, rhinorrhea and sinus pressure. Negative for ear discharge, ear pain, sinus pain, sneezing, sore throat, tinnitus, trouble swallowing and voice change.   Eyes: Negative.   Respiratory: Positive for cough. Negative for chest tightness, shortness of breath and wheezing.   Cardiovascular: Negative.   Gastrointestinal: Negative.   Musculoskeletal: Positive for myalgias.  Neurological: Negative.     Objective:  Physical Exam Constitutional:      Appearance: He is well-developed.  HENT:     Head: Normocephalic and atraumatic.     Comments: Oropharynx with redness and clear drainage, nose with swollen turbinates, TMs normal bilaterally.  Neck:     Musculoskeletal: Normal range of motion.     Thyroid: No thyromegaly.  Cardiovascular:     Rate and Rhythm: Normal rate and regular rhythm.  Pulmonary:     Effort: Pulmonary effort is normal. No respiratory distress.     Breath sounds: Normal breath sounds. No wheezing or rales.     Comments: Mild rhonchi which clear with cough Abdominal:   Palpations: Abdomen is soft.  Musculoskeletal:        General: No tenderness.  Lymphadenopathy:     Cervical: No cervical adenopathy.  Skin:    General: Skin is warm and dry.  Neurological:     Mental Status: He is alert and oriented to person, place, and time.     Vitals:   06/28/18 1057  BP: (!) 158/100  Pulse: 92  Temp: 97.7 F (36.5 C)  TempSrc: Oral  SpO2: 96%  Weight: (!) 305 lb (138.3 kg)  Height: 6\' 3"  (1.905 m)    Assessment & Plan:  Depo-medrol 40 mg IM

## 2018-06-30 DIAGNOSIS — R1084 Generalized abdominal pain: Secondary | ICD-10-CM | POA: Diagnosis not present

## 2018-07-01 ENCOUNTER — Telehealth: Payer: Self-pay | Admitting: Internal Medicine

## 2018-07-01 DIAGNOSIS — Z0279 Encounter for issue of other medical certificate: Secondary | ICD-10-CM

## 2018-07-01 NOTE — Telephone Encounter (Signed)
FMLA forms have been dropped off by patient. To be completed for 1/8 to 1/16.   Patient saw Dr.Crawford but she is out of office. PCP is Dr.John, forms will be completed under Dr.John.

## 2018-07-01 NOTE — Telephone Encounter (Signed)
Forms have been signed, faxed to [employer] @ [phone], copy sent to scan & charged for.   Patient informed & original mailed to patient.

## 2018-08-30 ENCOUNTER — Encounter: Payer: Self-pay | Admitting: Internal Medicine

## 2018-08-30 ENCOUNTER — Ambulatory Visit (INDEPENDENT_AMBULATORY_CARE_PROVIDER_SITE_OTHER): Payer: 59 | Admitting: Internal Medicine

## 2018-08-30 ENCOUNTER — Ambulatory Visit (INDEPENDENT_AMBULATORY_CARE_PROVIDER_SITE_OTHER)
Admission: RE | Admit: 2018-08-30 | Discharge: 2018-08-30 | Disposition: A | Discharge status: Home / Self Care | Payer: 59 | Source: Ambulatory Visit | Attending: Internal Medicine | Admitting: Internal Medicine

## 2018-08-30 ENCOUNTER — Other Ambulatory Visit: Payer: Self-pay

## 2018-08-30 VITALS — BP 126/84 | HR 80 | Temp 98.0°F | Ht 75.0 in | Wt 291.0 lb

## 2018-08-30 DIAGNOSIS — E785 Hyperlipidemia, unspecified: Secondary | ICD-10-CM

## 2018-08-30 DIAGNOSIS — R109 Unspecified abdominal pain: Secondary | ICD-10-CM

## 2018-08-30 DIAGNOSIS — I10 Essential (primary) hypertension: Secondary | ICD-10-CM | POA: Diagnosis not present

## 2018-08-30 DIAGNOSIS — R0789 Other chest pain: Secondary | ICD-10-CM | POA: Diagnosis not present

## 2018-08-30 DIAGNOSIS — R7302 Impaired glucose tolerance (oral): Secondary | ICD-10-CM | POA: Diagnosis not present

## 2018-08-30 DIAGNOSIS — Z0000 Encounter for general adult medical examination without abnormal findings: Secondary | ICD-10-CM

## 2018-08-30 LAB — POCT GLYCOSYLATED HEMOGLOBIN (HGB A1C)
HbA1c POC (<> result, manual entry): 5.8 % (ref 4.0–5.6)
Hemoglobin A1C: 5.8 % — AB (ref 4.0–5.6)

## 2018-08-30 MED ORDER — TRAMADOL HCL 50 MG PO TABS: 50.0000 mg | ORAL_TABLET | Freq: Four times a day (QID) | ORAL | 0 refills | Status: DC | PRN

## 2018-08-30 NOTE — Addendum Note (Signed)
Addended by: Marcina Millard on: 08/30/2018 03:58 PM   Modules accepted: Orders

## 2018-08-30 NOTE — Assessment & Plan Note (Signed)
?   msk vs other, for pain med prn, also cxr/left rib films,  to f/u any worsening symptoms or concerns

## 2018-08-30 NOTE — Patient Instructions (Signed)
Please take all new medication as prescribed - the pain medication (and call in 1 wk if need further pain medication)  Your A1c was OK today  Please continue all other medications as before, and refills have been done if requested.  Please have the pharmacy call with any other refills you may need.  Please continue your efforts at being more active, low cholesterol diet, and weight control  Please keep your appointments with your specialists as you may have planned  Please go to the XRAY Department in the Basement (go straight as you get off the elevator) for the x-ray testing  You will be contacted by phone if any changes need to be made immediately.  Otherwise, you will receive a letter about your results with an explanation, but please check with MyChart first.  Please remember to sign up for MyChart if you have not done so, as this will be important to you in the future with finding out test results, communicating by private email, and scheduling acute appointments online when needed.  Please return in 6 months, or sooner if needed, with Lab testing done 3-5 days before

## 2018-08-30 NOTE — Progress Notes (Addendum)
Subjective:    Patient ID: Robert Lloyd, male    DOB: June 14, 1971, 48 y.o.   MRN: [id number]  HPI  Here with 2-3 mo onset constant soreness like pain to left lower back, worse to bend and twisting, + worse to cough or deep breaths, can wake him up at night, nothing else makes better or worse.  Denies urinary symptoms such as dysuria, frequency, urgency, flank pain, hematuria or n/v, fever, chills.  Did see urology about 1 mo ago, no evidence for stone.  Denies worsening reflux, abd pain, dysphagia, n/v, bowel change or blood.  Pt denies bowel or bladder change, fever, wt loss,  worsening LE pain/numbness/weakness, gait change or falls.  Has not been able to go to the gym, hurts more even to walk there.  Heat and ice not helping.  Mobic no help.  Ibuprofen 800 no help.  Pt denies new neurological symptoms such as new headache, or facial or extremity weakness or numbness   Pt denies polydipsia, polyuria, or low sugar symptoms such as weakness or confusion improved with po intake.  Pt states overall good compliance with meds, trying to follow lower cholesterol, diabetic diet, wt overall stable but little exercise however.  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 Anxiety state, unspecified 11/09/2012  . DEPRESSION 03/31/2007   Qualifier: Diagnosis of  By: Jenny Reichmann MD, Hunt Oris   . HYPERLIPIDEMIA 03/31/2007   Qualifier: Diagnosis of  By: Jenny Reichmann MD, Hunt Oris   . HYPERTENSION 04/10/2008   Qualifier: Diagnosis of  By: Loanne Drilling MD, Jacelyn Pi Obesity    Past Surgical History:  Procedure Laterality Date  . HERNIA REPAIR     infancy  . rotater cuff repair Right     reports that he has never smoked. He has never used smokeless tobacco. He reports current alcohol use. He reports that he does not use drugs. family history includes Hypertension in his mother. Allergies  Allergen Reactions  . Sulfonamide Derivatives     UNKNOWN reaction    Current Outpatient Medications on File Prior to Visit  Medication Sig Dispense Refill  . citalopram (CELEXA) 10 MG tablet Take 1 tablet (10 mg total) by mouth daily. 90 tablet 3  . clonazePAM (KLONOPIN) 0.5 MG tablet TAKE 1 TABLET BY MOUTH TWICE A DAY AS NEEDED FOR ANXIETY 60 tablet 5  . DUEXIS 800-26.6 MG TABS Take 1 tablet by mouth 3 (three) times daily as needed. (Patient taking differently: Take 1 tablet by mouth 3 (three) times daily as needed (for pain). ) 90 tablet 0  . HYDROcodone-homatropine (HYCODAN) 5-1.5 MG/5ML syrup Take 5-10 mLs by mouth at bedtime as needed for cough. 50 mL 0  . rosuvastatin (CRESTOR) 20 MG tablet Take 1 tablet (20 mg total) by mouth daily. 90 tablet 3  . zolpidem (AMBIEN CR) 12.5 MG CR tablet Take 1 tablet (12.5 mg total) by mouth at bedtime as needed. for sleep 90 tablet 1   No current facility-administered medications on file prior to visit.    Review of Systems  Constitutional: Negative for other unusual diaphoresis or sweats HENT: Negative for ear discharge or swelling Eyes: Negative for other worsening visual disturbances Respiratory: Negative for stridor or other swelling  Gastrointestinal: Negative for worsening distension or other blood Genitourinary: Negative for retention or other urinary change Musculoskeletal: Negative for other MSK pain or swelling Skin: Negative for color change or other  new lesions Neurological: Negative for worsening tremors and other numbness  Psychiatric/Behavioral: Negative for worsening agitation or other fatigue All other system ne per pt    Objective:   Physical Exam BP 126/84   Pulse 80   Temp 98 F (36.7 C) (Oral)   Ht 6\' 3"  (1.905 m)   Wt 291 lb (132 kg)   SpO2 96%   BMI 36.37 kg/m  VS noted,  Constitutional: Pt appears in NAD HENT: Head: NCAT.  Right Ear: External ear normal.  Left Ear: External ear normal.  Eyes: . Pupils are equal, round, and reactive to light. Conjunctivae and EOM are normal Nose:  without d/c or deformity Neck: Neck supple. Gross normal ROM Cardiovascular: Normal rate and regular rhythm.   Pulmonary/Chest: Effort normal and breath sounds without rales or wheezing.  Abd:  Soft, NT, ND, + BS, no organomegaly Neurological: Pt is alert. At baseline orientation, motor grossly intact Skin: Skin is warm. No rashes, other new lesions, no LE edema Psychiatric: Pt behavior is normal without agitation  No other exam findings Lab Results  Component Value Date   WBC 8.7 03/15/2018   HGB 14.6 03/15/2018   HCT 43.6 03/15/2018   PLT 235.0 03/15/2018   GLUCOSE 108 (H) 03/15/2018   CHOL 243 (H) 03/15/2018   TRIG 296.0 (H) 03/15/2018   HDL 35.40 (L) 03/15/2018   LDLDIRECT 160.0 03/15/2018   LDLCALC 195 (H) 07/31/2015   ALT 26 03/15/2018   AST 18 03/15/2018   NA 139 03/15/2018   K 4.0 03/15/2018   CL 103 03/15/2018   CREATININE 0.88 03/15/2018   BUN 16 03/15/2018   CO2 29 03/15/2018   TSH 1.10 03/15/2018   PSA 0.20 03/15/2018   HGBA1C 6.0 03/15/2018   A1c today POCT - normal    Assessment & Plan:  Medical screening examination/treatment/procedure(s) were performed by non-physician practitioner and as supervising physician I was immediately available for consultation/collaboration. I agree with above. [name], MD

## 2018-08-30 NOTE — Assessment & Plan Note (Signed)
stable overall by history and exam, recent data reviewed with pt, and pt to continue medical treatment as before,  to f/u any worsening symptoms or concerns  

## 2018-08-30 NOTE — Assessment & Plan Note (Signed)
A1c ok today, to cont wt control, diet

## 2018-08-30 NOTE — Assessment & Plan Note (Signed)
Tolerating statin well, cont med and low chol diet, f/u lab next visit per pt

## 2018-08-31 ENCOUNTER — Encounter: Payer: Self-pay | Admitting: Internal Medicine

## 2018-09-14 ENCOUNTER — Other Ambulatory Visit: Payer: Self-pay | Admitting: Internal Medicine

## 2018-09-14 NOTE — Telephone Encounter (Signed)
Done erx 

## 2018-09-29 ENCOUNTER — Ambulatory Visit (INDEPENDENT_AMBULATORY_CARE_PROVIDER_SITE_OTHER): Payer: 59 | Admitting: Internal Medicine

## 2018-09-29 ENCOUNTER — Ambulatory Visit: Payer: Self-pay | Admitting: *Deleted

## 2018-09-29 ENCOUNTER — Telehealth: Payer: Self-pay | Admitting: *Deleted

## 2018-09-29 ENCOUNTER — Encounter: Payer: Self-pay | Admitting: Internal Medicine

## 2018-09-29 DIAGNOSIS — F411 Generalized anxiety disorder: Secondary | ICD-10-CM

## 2018-09-29 DIAGNOSIS — R7302 Impaired glucose tolerance (oral): Secondary | ICD-10-CM | POA: Diagnosis not present

## 2018-09-29 DIAGNOSIS — B349 Viral infection, unspecified: Secondary | ICD-10-CM

## 2018-09-29 NOTE — Assessment & Plan Note (Signed)
stable overall by history and exam, recent data reviewed with pt, and pt to continue medical treatment as before,  to f/u any worsening symptoms or concerns  

## 2018-09-29 NOTE — Telephone Encounter (Signed)
appt set up already

## 2018-09-29 NOTE — Telephone Encounter (Signed)
Headache, body aches, chills-patient has been sent home from work dur to his symptoms- although they are mild- COVID-19 protocols reviewed and Virtual visit made with provider.  Reason for Disposition . [1] Fever (or feeling feverish) OR cough AND [2] within 14 Days of COVID-19 EXPOSURE (Close Contact)    Patient has been sent home from work- feverish- chills, headache, fatigue- there have been cases at his work- but no known exposure. Patient has been scheduled with provider to review his symptoms and home care.  Answer Assessment - Initial Assessment Questions 1. DESCRIPTION: "Describe how you are feeling."     headache 3 days, body aches and fatigue- no energy, chills- come and go 2. SEVERITY: "How bad is it?"  "Can you stand and walk?"   - MILD - Feels weak or tired, but does not interfere with work, school or normal activities   - [place] to stand and walk; weakness interferes with work, school, or normal activities   - SEVERE - Unable to stand or walk     Mild/moderate 3. ONSET:  "When did the weakness begin?"     Several days 4. CAUSE: "What do you think is causing the weakness?"     Unknown- chills present- no thermometer- patient has been normal at work 5. MEDICINES: "Have you recently started a new medicine or had a change in the amount of a medicine?"     No changes 6. OTHER SYMPTOMS: "Do you have any other symptoms?" (e.g., chest pain, fever, cough, SOB, vomiting, diarrhea, bleeding, other areas of pain)     1 morning of chest pain and SOB- Monday am 7. PREGNANCY: "Is there any chance you are pregnant?" "When was your last menstrual period?"     n/a  Answer Assessment - Initial Assessment Questions 1. LOCATION: "Where does it hurt?"      All over- 3 days 2. ONSET: "When did the headache start?" (Minutes, hours or days)      3 days 3. PATTERN: "Does the pain come and go, or has it been constant since it started?"     constant 4. SEVERITY: "How bad is the pain?" and  "What does it keep you from doing?"  (e.g., Scale 1-10; mild, moderate, or severe)   - MILD (1-3): doesn't interfere with normal activities    - MODERATE (4-7): interferes with normal activities or awakens from sleep    - SEVERE (8-10): excruciating pain, unable to do any normal activities        6- moderate 5. RECURRENT SYMPTOM: "Have you ever had headaches before?" If so, ask: "When was the last time?" and "What happened that time?"      Never had this type of headache before 6. CAUSE: "What do you think is causing the headache?"     unknown 7. MIGRAINE: "Have you been diagnosed with migraine headaches?" If so, ask: "Is this headache similar?"      Yes- no migraines in a while- this is not like that 8. HEAD INJURY: "Has there been any recent injury to the head?"      no 9. OTHER SYMPTOMS: "Do you have any other symptoms?" (fever, stiff neck, eye pain, sore throat, cold symptoms)     feverish- chills, neck pain- stiffness, sore throat 2 days ago 10. PREGNANCY: "Is there any chance you are pregnant?" "When was your last menstrual period?"       n/a  Protocols used: CORONAVIRUS (COVID-19) EXPOSURE-A-AH, WEAKNESS (GENERALIZED) AND FATIGUE-A-AH, HEADACHE-A-AH

## 2018-09-29 NOTE — Progress Notes (Signed)
Patient ID: Robert Lloyd, male   DOB: 01/05/1971, 48 y.o.   MRN: [id number]  Virtual Visit via Video Note  I connected with Robert Lloyd on 09/29/18 at  1:40 PM EDT by a video enabled telemedicine application and verified that I am speaking with the correct person using two identifiers.     I discussed the limitations of evaluation and management by telemedicine and the availability of in person appointments. The patient expressed understanding and agreed to proceed.  History of Present Illness: Here with c/o 2 days onset HA, body aches and myalgias all over, slight ST only, but no cough or sob.  Pt denies chest pain, increased sob or doe, wheezing, orthopnea, PND, increased LE swelling, palpitations, dizziness or syncope. A person was dx with coronavirus at his workplace but he had no known exposure.  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   Pt denies polydipsia, polyuria.  Denies worsening depressive symptoms, suicidal ideation, or panic Past Medical History:  Diagnosis Date  . Anxiety state, unspecified 11/09/2012  . DEPRESSION 03/31/2007   Qualifier: Diagnosis of  By: Jenny Reichmann MD, Hunt Oris   . HYPERLIPIDEMIA 03/31/2007   Qualifier: Diagnosis of  By: Jenny Reichmann MD, Hunt Oris   . HYPERTENSION 04/10/2008   Qualifier: Diagnosis of  By: Loanne Drilling MD, Jacelyn Pi Obesity    Past Surgical History:  Procedure Laterality Date  . HERNIA REPAIR     infancy  . rotater cuff repair Right     reports that he has never smoked. He has never used smokeless tobacco. He reports current alcohol use. He reports that he does not use drugs. family history includes Hypertension in his mother. Allergies  Allergen Reactions  . Sulfonamide Derivatives     UNKNOWN reaction   Current Outpatient Medications on File Prior to Visit  Medication Sig Dispense Refill  . citalopram (CELEXA) 10 MG tablet Take 1 tablet (10 mg total) by mouth daily. 90 tablet 3  . clonazePAM (KLONOPIN) 0.5 MG  tablet TAKE 1 TABLET BY MOUTH TWICE A DAY AS NEEDED FOR ANXIETY 60 tablet 5  . DUEXIS 800-26.6 MG TABS Take 1 tablet by mouth 3 (three) times daily as needed. (Patient taking differently: Take 1 tablet by mouth 3 (three) times daily as needed (for pain). ) 90 tablet 0  . rosuvastatin (CRESTOR) 20 MG tablet Take 1 tablet (20 mg total) by mouth daily. 90 tablet 3  . traMADol (ULTRAM) 50 MG tablet Take 1 tablet (50 mg total) by mouth every 6 (six) hours as needed. 30 tablet 0  . zolpidem (AMBIEN CR) 12.5 MG CR tablet TAKE 1 TABLET BY MOUTH AT BEDTIME AS NEEDED. FOR SLEEP 90 tablet 1   No current facility-administered medications on file prior to visit.     Observations/Objective: Alert, mild ill appearing, fatigued, mentating well, cn 2-12 intact, moves all 4s, resps normal, no cough during exam, no rash or swelling visible Lab Results  Component Value Date   WBC 8.7 03/15/2018   HGB 14.6 03/15/2018   HCT 43.6 03/15/2018   PLT 235.0 03/15/2018   GLUCOSE 108 (H) 03/15/2018   CHOL 243 (H) 03/15/2018   TRIG 296.0 (H) 03/15/2018   HDL 35.40 (L) 03/15/2018   LDLDIRECT 160.0 03/15/2018   LDLCALC 195 (H) 07/31/2015   ALT 26 03/15/2018   AST 18 03/15/2018   NA 139 03/15/2018   K 4.0 03/15/2018   CL 103 03/15/2018  CREATININE 0.88 03/15/2018   BUN 16 03/15/2018   CO2 29 03/15/2018   TSH 1.10 03/15/2018   PSA 0.20 03/15/2018   HGBA1C 5.8 (A) 08/30/2018   HGBA1C 5.8 08/30/2018   Assessment and Plan: See notes  Follow Up Instructions: See notes   I discussed the assessment and treatment plan with the patient. The patient was provided an opportunity to ask questions and all were answered. The patient agreed with the plan and demonstrated an understanding of the instructions.   The patient was advised to call back or seek an in-person evaluation if the symptoms worsen or if the condition fails to improve as anticipated.   [name], MD

## 2018-09-29 NOTE — Telephone Encounter (Signed)
Letter faxed to number below and mailed to pt. See 09/29/18 letter.

## 2018-09-29 NOTE — Patient Instructions (Signed)
Please continue all other medications as before, and refills have been done if requested.  Please have the pharmacy call with any other refills you may need.  Please continue your efforts at being more active, low cholesterol diet, and weight control.  Please keep your appointments with your specialists as you may have planned  Gets lots of rest, fluids, tylenol, self isolation, and go to [facility] ED for worsening fever, cough or sob

## 2018-09-29 NOTE — Assessment & Plan Note (Signed)
Not classic for coronavirus but cant r/o completely, at this stage ok to continue self isolation, work note given, for rest, fluids, tylenol and advil prn, to go to [facility] ED for worsening fever, onset of cough or sob

## 2018-09-29 NOTE — Telephone Encounter (Signed)
Copied from [place] [id number]. Topic: General - Inquiry >> Sep 29, 2018  2:00 PM Margot Ables wrote: Reason for CRM: pt stating he needs work note sent to fax # [phone] Attn: Malachy Mood. Also please send in mail to pt home address verified as: [address] [place]  [place] [address]

## 2018-10-17 NOTE — Telephone Encounter (Signed)
Patient called stating that his employer is also needing visit notes from the 09/29/2018 visit. He says that they have faxed [place] multiple times requesting this. Please advise.

## 2018-10-17 NOTE — Telephone Encounter (Signed)
Patient needs the visit notes from 09/29/18 sent to [place] ATTN: Amy E.  They are a 3rd party that works with his employers and patient says they faxed a 3rd and final request to get these notes.

## 2018-10-18 NOTE — Telephone Encounter (Signed)
Spoke with pt, he stated that he spoke to someone up front and received the phone number for our medical records dept and would follow up with them.

## 2018-11-21 ENCOUNTER — Other Ambulatory Visit: Payer: Self-pay

## 2018-11-21 ENCOUNTER — Other Ambulatory Visit: Payer: Self-pay | Admitting: Internal Medicine

## 2018-11-21 DIAGNOSIS — Z20822 Contact with and (suspected) exposure to covid-19: Secondary | ICD-10-CM

## 2018-11-25 LAB — NOVEL CORONAVIRUS, NAA: SARS-CoV-2, NAA: NOT DETECTED

## 2019-01-29 IMAGING — CT CT RENAL STONE PROTOCOL
2 of 4 series · 16 of 46 positions shown, 18 images · non-contrast
Comparison: CT of the abdomen performed 11/22/2010, and abdominal
ultrasound performed 03/30/2016

CLINICAL DATA: Acute onset of increased urinary frequency and
decreased urinary output. Right flank pain. Initial encounter.

EXAM:
CT ABDOMEN AND PELVIS WITHOUT CONTRAST
TECHNIQUE: Multidetector CT imaging of the abdomen and pelvis was performed
following the standard protocol without IV contrast.

[Series 2: axial st · axial · 0.98mm/px · z∈[-344,+156]mm · 13 of 112 slices shown, 15 images]
[im 6/112  soft-tissue]
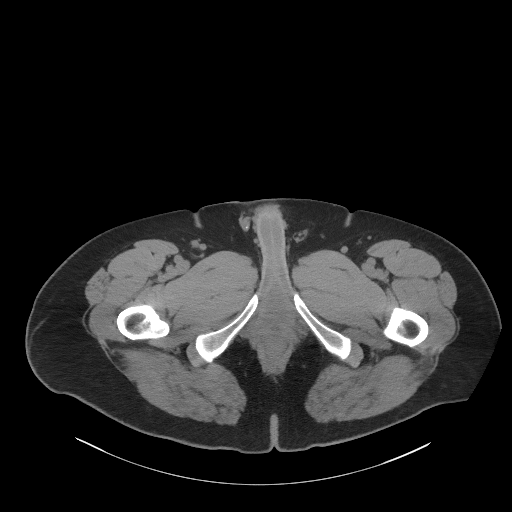
[im 6/112  bone]
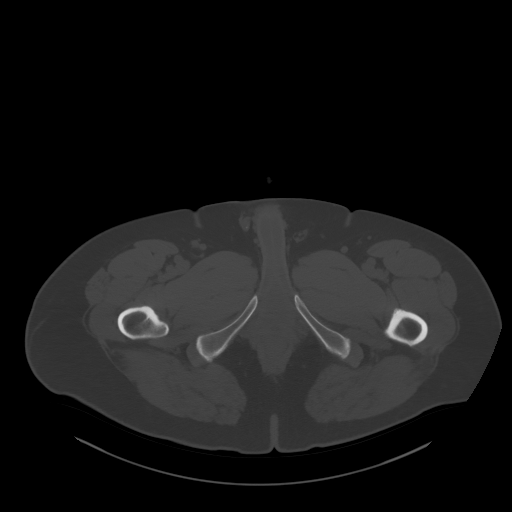
[im 16/112  soft-tissue]
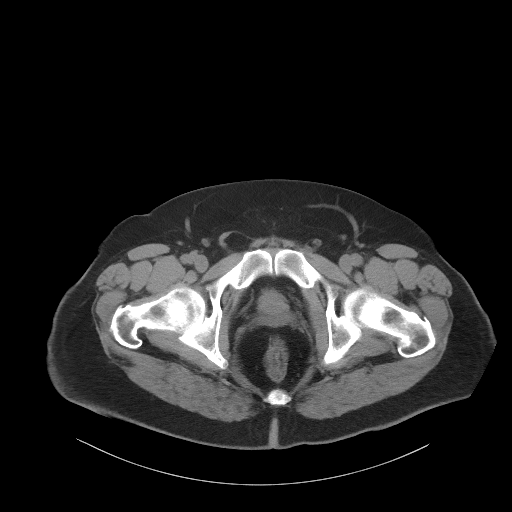
[im 26/112  soft-tissue]
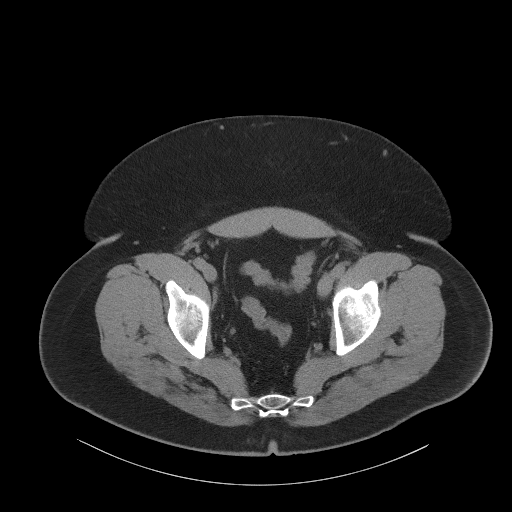
[im 31/112  soft-tissue]
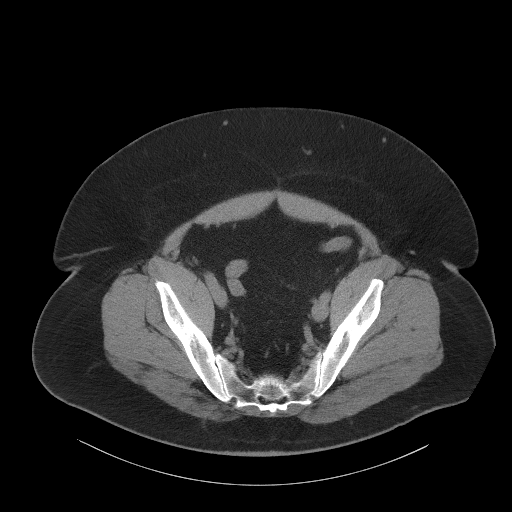
[im 41/112  soft-tissue]
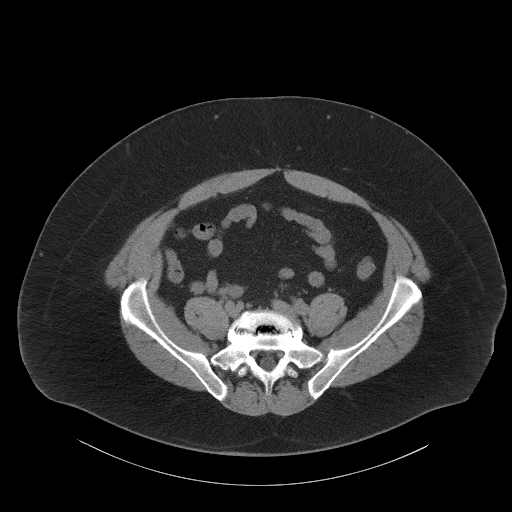
[im 46/112  soft-tissue]
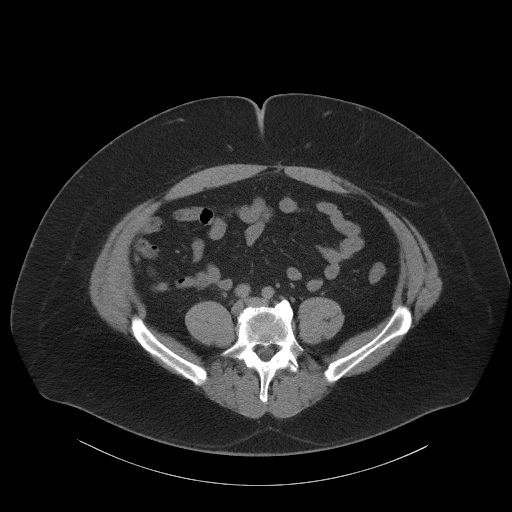
[im 56/112  soft-tissue]
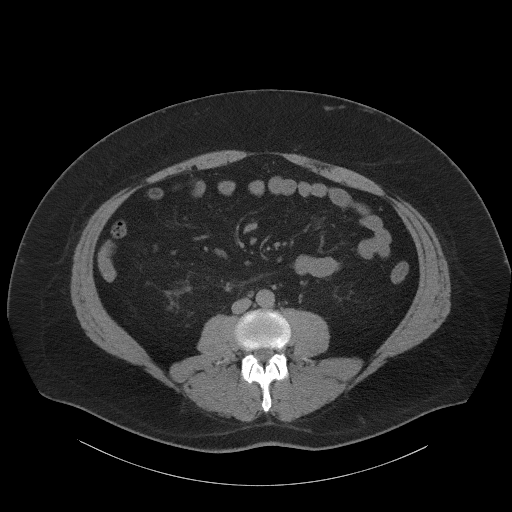
[im 66/112  soft-tissue]
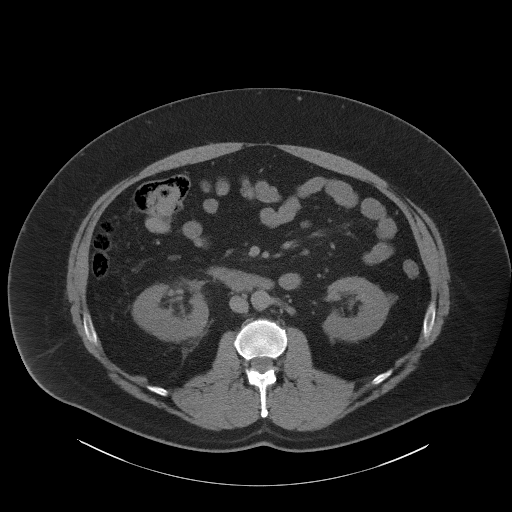
[im 71/112  soft-tissue]
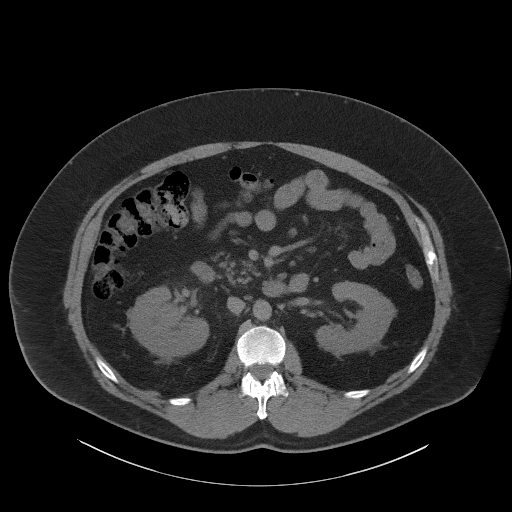
[im 71/112  bone]
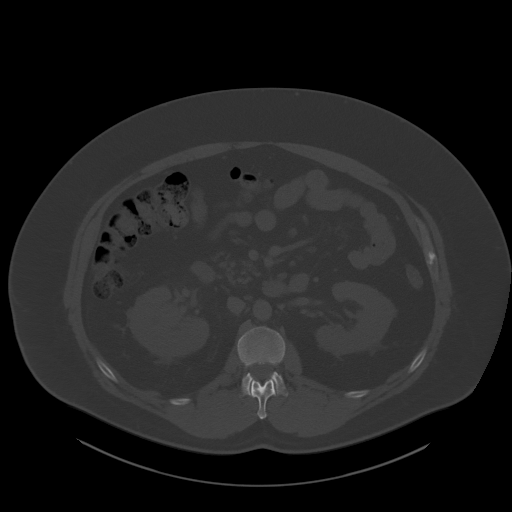
[im 81/112  soft-tissue]
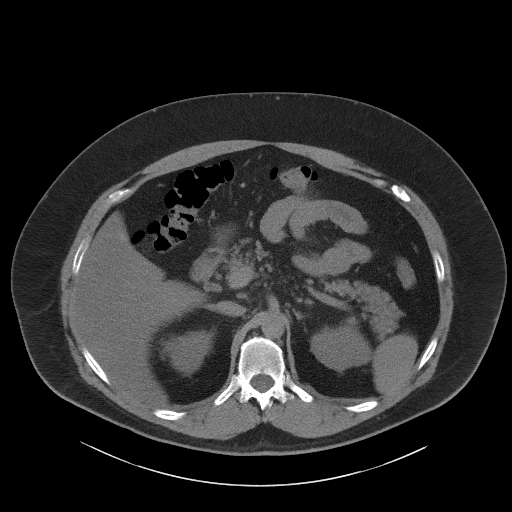
[im 86/112  soft-tissue]
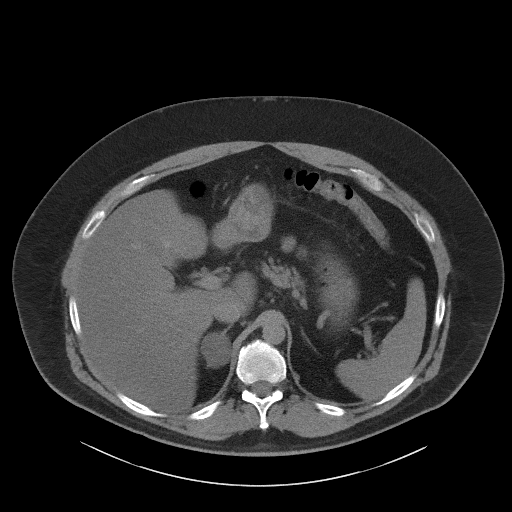
[im 96/112  soft-tissue]
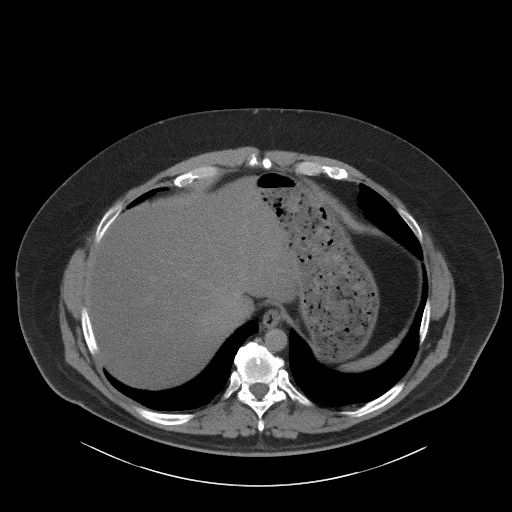
[im 106/112  soft-tissue]
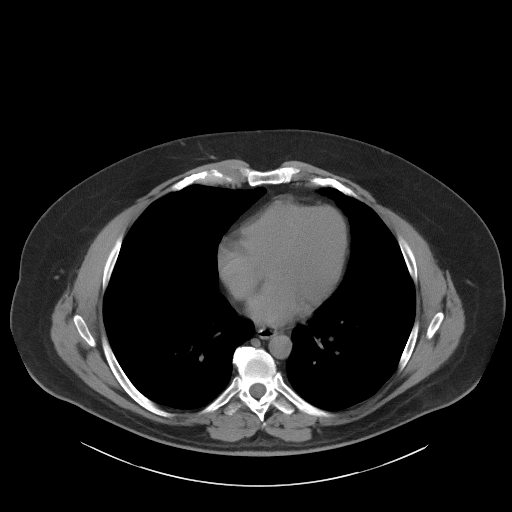

[Series 5: coronal st · coronal · 0.94mm/px · 3 of 111 slices shown]
[im 37/111  soft-tissue]
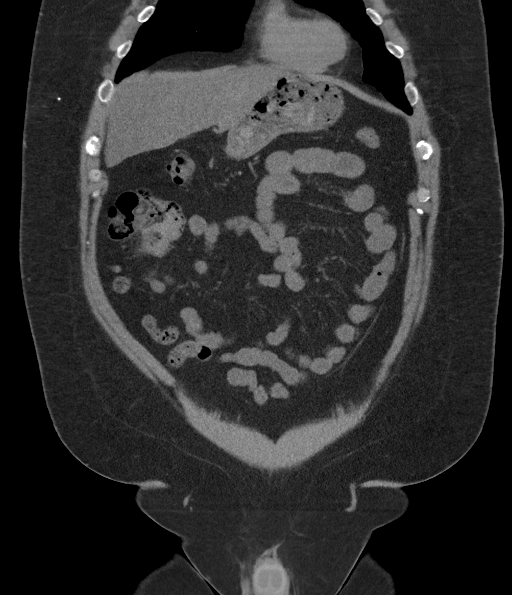
[im 49/111  soft-tissue]
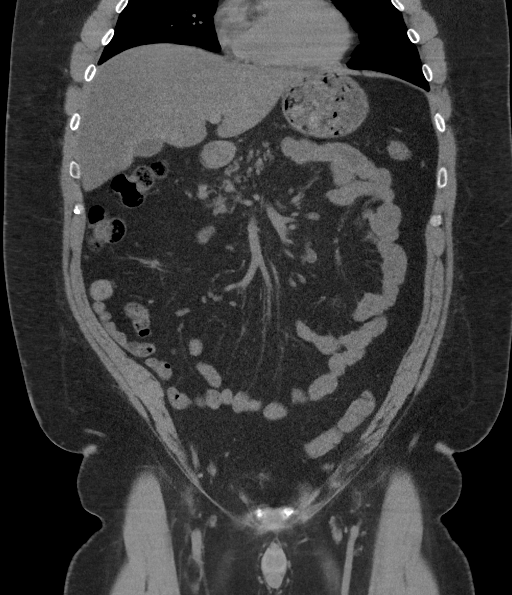
[im 62/111  soft-tissue]
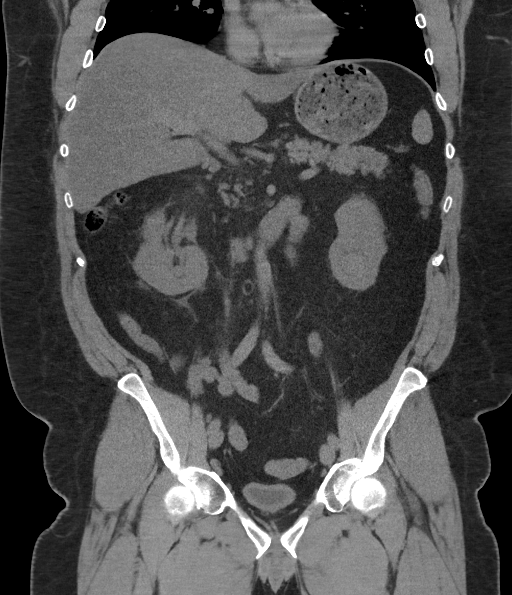

[16 of 46 positions shown; findings below may reference images not displayed]

FINDINGS: Lower chest: The visualized lung bases are grossly clear. The
visualized portions of the mediastinum are unremarkable.

Hepatobiliary: There is fatty infiltration within the liver, with
mild sparing about the gallbladder fossa. The common bile duct
remains normal in caliber. The gallbladder is unremarkable in
appearance.

Pancreas: The pancreas is within normal limits.

Spleen: The spleen is unremarkable in appearance.

Adrenals/Urinary Tract: A 3.5 cm right adrenal adenoma is noted. The
left adrenal gland is unremarkable

There is mild prominence of the right ureter along its entire
course, with surrounding soft tissue inflammation. No significant
hydronephrosis is seen. This appears to reflect an obstructing 7 x 4
mm stone at the distal right ureter, 1 cm above the right
vesicoureteral junction.

Nonspecific perinephric stranding is noted bilaterally. A small left
renal cyst is seen. No nonobstructing renal stones are identified.

Stomach/Bowel: The stomach is unremarkable in appearance. The small
bowel is within normal limits. The appendix is normal in caliber,
without evidence of appendicitis. The colon is unremarkable in
appearance.

Vascular/Lymphatic: Minimal calcification is noted at the aortic
bifurcation. No retroperitoneal or pelvic sidewall lymphadenopathy
is seen.

Reproductive: The bladder is decompressed and not well assessed. The
prostate remains normal in size.

Other: No additional soft tissue abnormalities are seen.

Musculoskeletal: No acute osseous abnormalities are identified.
Vacuum phenomenon is noted at L5-S1. The visualized musculature is
unremarkable in appearance.
IMPRESSION: 1. Mild prominence of the right ureter, with surrounding soft tissue
inflammation. No significant hydronephrosis seen. This appears to
reflect an obstructing 7 x 4 mm stone at the distal right ureter, 1
cm above the right vesicoureteral junction.
2. Fatty infiltration within the liver.
3. 3.5 cm right adrenal adenoma incidentally noted.
4. Small left renal cyst.

## 2019-03-06 ENCOUNTER — Other Ambulatory Visit: Payer: Self-pay | Admitting: Internal Medicine

## 2019-06-20 ENCOUNTER — Other Ambulatory Visit: Payer: Self-pay | Admitting: Internal Medicine

## 2019-06-20 NOTE — Telephone Encounter (Signed)
Done erx 

## 2019-09-22 ENCOUNTER — Other Ambulatory Visit: Payer: Self-pay | Admitting: Internal Medicine

## 2019-09-22 NOTE — Telephone Encounter (Signed)
Done erx 

## 2019-12-20 ENCOUNTER — Encounter: Payer: 59 | Admitting: Internal Medicine

## 2019-12-25 ENCOUNTER — Other Ambulatory Visit: Payer: Self-pay | Admitting: Internal Medicine

## 2019-12-25 NOTE — Telephone Encounter (Signed)
Done erx  Staff to ask pt for ROV for further refills

## 2019-12-26 NOTE — Telephone Encounter (Signed)
Pt has f/u appt already scheduled for 12/29/19.Marland KitchenJohny Lloyd

## 2019-12-29 ENCOUNTER — Ambulatory Visit (INDEPENDENT_AMBULATORY_CARE_PROVIDER_SITE_OTHER): Payer: 59 | Admitting: Internal Medicine

## 2019-12-29 ENCOUNTER — Encounter: Payer: Self-pay | Admitting: Internal Medicine

## 2019-12-29 ENCOUNTER — Other Ambulatory Visit: Payer: Self-pay

## 2019-12-29 VITALS — BP 130/86 | HR 98 | Temp 98.0°F | Ht 75.0 in | Wt 297.0 lb

## 2019-12-29 DIAGNOSIS — E538 Deficiency of other specified B group vitamins: Secondary | ICD-10-CM

## 2019-12-29 DIAGNOSIS — Z0000 Encounter for general adult medical examination without abnormal findings: Secondary | ICD-10-CM

## 2019-12-29 DIAGNOSIS — E559 Vitamin D deficiency, unspecified: Secondary | ICD-10-CM

## 2019-12-29 DIAGNOSIS — M25512 Pain in left shoulder: Secondary | ICD-10-CM | POA: Diagnosis not present

## 2019-12-29 DIAGNOSIS — Z0001 Encounter for general adult medical examination with abnormal findings: Secondary | ICD-10-CM

## 2019-12-29 DIAGNOSIS — E785 Hyperlipidemia, unspecified: Secondary | ICD-10-CM

## 2019-12-29 DIAGNOSIS — R7302 Impaired glucose tolerance (oral): Secondary | ICD-10-CM

## 2019-12-29 DIAGNOSIS — I10 Essential (primary) hypertension: Secondary | ICD-10-CM

## 2019-12-29 DIAGNOSIS — M542 Cervicalgia: Secondary | ICD-10-CM

## 2019-12-29 DIAGNOSIS — M25522 Pain in left elbow: Secondary | ICD-10-CM

## 2019-12-29 MED ORDER — ROSUVASTATIN CALCIUM 20 MG PO TABS: 20.0000 mg | ORAL_TABLET | ORAL | 3 refills | Status: DC

## 2019-12-29 MED ORDER — ZOLPIDEM TARTRATE ER 12.5 MG PO TBCR: 12.5000 mg | EXTENDED_RELEASE_TABLET | Freq: Every evening | ORAL | 1 refills | Status: DC | PRN

## 2019-12-29 MED ORDER — DUEXIS 800-26.6 MG PO TABS: 1.0000 | ORAL_TABLET | Freq: Three times a day (TID) | ORAL | 1 refills | Status: DC | PRN

## 2019-12-29 NOTE — Patient Instructions (Signed)
.  Please continue all other medications as before, and refills have been done if requested.  Please have the pharmacy call with any other refills you may need.  Please continue your efforts at being more active, low cholesterol diet, and weight control.  You are otherwise up to date with prevention measures today.  Please keep your appointments with your specialists as you may have planned  You will be contacted regarding the referral for: sports medicine  Please go to the LAB at the blood drawing area for the tests to be done  You will be contacted by phone if any changes need to be made immediately.  Otherwise, you will receive a letter about your results with an explanation, but please check with MyChart first.  Please remember to sign up for MyChart if you have not done so, as this will be important to you in the future with finding out test results, communicating by private email, and scheduling acute appointments online when needed.  Please make an Appointment to return for your 1 year visit, or sooner if needed

## 2019-12-29 NOTE — Progress Notes (Signed)
Subjective:    Patient ID: Robert Lloyd, male    DOB: Nov 28, 1970, 49 y.o.   MRN: [id number]  HPI  Here for wellness and f/u;  Overall doing ok;  Pt denies Chest pain, worsening SOB, DOE, wheezing, orthopnea, PND, worsening LE edema, palpitations, dizziness or syncope.  Pt denies neurological change such as new headache, facial or extremity weakness.  Pt denies polydipsia, polyuria, or low sugar symptoms. Pt states overall good compliance with treatment and medications, good tolerability, and has been trying to follow appropriate diet.  Pt denies worsening depressive symptoms, suicidal ideation or panic. No fever, night sweats, wt loss, loss of appetite, or other constitutional symptoms.  Pt states good ability with ADL's, has low fall risk, home safety reviewed and adequate, no other significant changes in hearing or vision, and only occasionally active with exercise. Also, Brother died 36 mo ago with ETOH, so pt grieving and more stressed. Has a counseling program at work he might take advantage of.  Has been trying to be more active and get wt down: Has been getting back to the gym the last few wks.   Wt Readings from Last 3 Encounters:  12/29/19 297 lb (134.7 kg)  08/30/18 291 lb (132 kg)  06/28/18 (!) 305 lb (138.3 kg)   BP Readings from Last 3 Encounters:  12/29/19 130/86  08/30/18 126/84  06/28/18 (!) 158/100  Also c/o pain to the left elbow area and maybe involves the left shoudler area, such that it seem with with pain to the bicep tendon insertion site laterally at the elbow and to the mid arm that makes it seem weak to pick up and pulling with the arm, but not with pushing with the arms.  S/p rot cuff surgury surgury and bone spurs removal years ago but not sure which shoulder.  Has had neck pain to the lef tpost lat neck with waking up and stiffness to horizonta movement it will work itself out, but not clearly radicalar No right neck or RUE pain. Now taking statin qod due to leg  pains.     Past Medical History:  Diagnosis Date  . Anxiety state, unspecified 11/09/2012  . DEPRESSION 03/31/2007   Qualifier: Diagnosis of  By: Jenny Reichmann MD, Hunt Oris   . HYPERLIPIDEMIA 03/31/2007   Qualifier: Diagnosis of  By: Jenny Reichmann MD, Hunt Oris   . HYPERTENSION 04/10/2008   Qualifier: Diagnosis of  By: Loanne Drilling MD, Jacelyn Pi Obesity    Past Surgical History:  Procedure Laterality Date  . HERNIA REPAIR     infancy  . rotater cuff repair Right     reports that he has never smoked. He has never used smokeless tobacco. He reports current alcohol use. He reports that he does not use drugs. family history includes Hypertension in his mother. Allergies  Allergen Reactions  . Sulfonamide Derivatives     UNKNOWN reaction   Current Outpatient Medications on File Prior to Visit  Medication Sig Dispense Refill  . clonazePAM (KLONOPIN) 0.5 MG tablet TAKE 1 TABLET BY MOUTH TWICE A DAY AS NEEDED FOR ANXIETY 60 tablet 0   No current facility-administered medications on file prior to visit.   Review of Systems All otherwise neg per pt     Objective:   Physical Exam BP 130/86 (BP Location: Left Arm, Patient Position: Sitting, Cuff Size: Large)   Pulse 98   Temp 98 F (36.7 C) (Oral)   Ht 6\' 3"  (1.905 m)   Wt  297 lb (134.7 kg)   SpO2 96%   BMI 37.12 kg/m  VS noted,  Constitutional: Pt appears in NAD HENT: Head: NCAT.  Right Ear: External ear normal.  Left Ear: External ear normal.  Eyes: . Pupils are equal, round, and reactive to light. Conjunctivae and EOM are normal Nose: without d/c or deformity Neck: Neck supple. Gross normal ROM Cardiovascular: Normal rate and regular rhythm.   Pulmonary/Chest: Effort normal and breath sounds without rales or wheezing.  Abd:  Soft, NT, ND, + BS, no organomegaly Neurological: Pt is alert. At baseline orientation, motor grossly intact Skin: Skin is warm. No rashes, other new lesions, no LE edema Psychiatric: Pt behavior is normal without  agitation  All otherwise neg per pt Lab Results  Component Value Date   WBC 7.9 12/29/2019   HGB 14.0 12/29/2019   HCT 43.5 12/29/2019   PLT 220 12/29/2019   GLUCOSE 126 (H) 12/29/2019   CHOL 145 12/29/2019   TRIG 184 (H) 12/29/2019   HDL 37 (L) 12/29/2019   LDLDIRECT 160.0 03/15/2018   LDLCALC 80 12/29/2019   ALT 25 12/29/2019   AST 19 12/29/2019   NA 138 12/29/2019   K 4.4 12/29/2019   CL 102 12/29/2019   CREATININE 0.76 12/29/2019   BUN 15 12/29/2019   CO2 26 12/29/2019   TSH 1.61 12/29/2019   PSA 0.2 12/29/2019   HGBA1C 5.9 (H) 12/29/2019      Assessment & Plan:

## 2019-12-30 ENCOUNTER — Encounter: Payer: Self-pay | Admitting: Internal Medicine

## 2019-12-30 LAB — CBC WITH DIFFERENTIAL/PLATELET
Absolute Monocytes: 640 cells/uL (ref 200–950)
Basophils Absolute: 63 cells/uL (ref 0–200)
Basophils Relative: 0.8 %
Eosinophils Absolute: 79 cells/uL (ref 15–500)
Eosinophils Relative: 1 %
HCT: 43.5 % (ref 38.5–50.0)
Hemoglobin: 14 g/dL (ref 13.2–17.1)
Lymphs Abs: 1588 cells/uL (ref 850–3900)
MCH: 28.6 pg (ref 27.0–33.0)
MCHC: 32.2 g/dL (ref 32.0–36.0)
MCV: 88.8 fL (ref 80.0–100.0)
MPV: 10.4 fL (ref 7.5–12.5)
Monocytes Relative: 8.1 %
Neutro Abs: 5530 cells/uL (ref 1500–7800)
Neutrophils Relative %: 70 %
Platelets: 220 10*3/uL (ref 140–400)
RBC: 4.9 10*6/uL (ref 4.20–5.80)
RDW: 13.1 % (ref 11.0–15.0)
Total Lymphocyte: 20.1 %
WBC: 7.9 10*3/uL (ref 3.8–10.8)

## 2019-12-30 LAB — BASIC METABOLIC PANEL
BUN: 15 mg/dL (ref 7–25)
CO2: 26 mmol/L (ref 20–32)
Calcium: 9.5 mg/dL (ref 8.6–10.3)
Chloride: 102 mmol/L (ref 98–110)
Creat: 0.76 mg/dL (ref 0.60–1.35)
Glucose, Bld: 126 mg/dL — ABNORMAL HIGH (ref 65–99)
Potassium: 4.4 mmol/L (ref 3.5–5.3)
Sodium: 138 mmol/L (ref 135–146)

## 2019-12-30 LAB — HEPATIC FUNCTION PANEL
AG Ratio: 1.4 (calc) (ref 1.0–2.5)
ALT: 25 U/L (ref 9–46)
AST: 19 U/L (ref 10–40)
Albumin: 4.2 g/dL (ref 3.6–5.1)
Alkaline phosphatase (APISO): 78 U/L (ref 36–130)
Bilirubin, Direct: 0.1 mg/dL (ref 0.0–0.2)
Globulin: 3 g/dL (calc) (ref 1.9–3.7)
Indirect Bilirubin: 0.3 mg/dL (calc) (ref 0.2–1.2)
Total Bilirubin: 0.4 mg/dL (ref 0.2–1.2)
Total Protein: 7.2 g/dL (ref 6.1–8.1)

## 2019-12-30 LAB — URINALYSIS, ROUTINE W REFLEX MICROSCOPIC
Bilirubin Urine: NEGATIVE
Glucose, UA: NEGATIVE
Hgb urine dipstick: NEGATIVE
Ketones, ur: NEGATIVE
Leukocytes,Ua: NEGATIVE
Nitrite: NEGATIVE
Protein, ur: NEGATIVE
Specific Gravity, Urine: 1.021 (ref 1.001–1.03)
pH: <=5 (ref 5.0–8.0)

## 2019-12-30 LAB — LIPID PANEL
Cholesterol: 145 mg/dL (ref <200)
HDL: 37 mg/dL — ABNORMAL LOW (ref >=40)
LDL Cholesterol (Calc): 80 mg/dL (calc)
Non-HDL Cholesterol (Calc): 108 mg/dL (calc) (ref <130)
Total CHOL/HDL Ratio: 3.9 (calc) (ref <5.0)
Triglycerides: 184 mg/dL — ABNORMAL HIGH (ref <150)

## 2019-12-30 LAB — VITAMIN D 25 HYDROXY (VIT D DEFICIENCY, FRACTURES): Vit D, 25-Hydroxy: 44 ng/mL (ref 30–100)

## 2019-12-30 LAB — VITAMIN B12: Vitamin B-12: 800 pg/mL (ref 200–1100)

## 2019-12-30 LAB — HEMOGLOBIN A1C
Hgb A1c MFr Bld: 5.9 % of total Hgb — ABNORMAL HIGH (ref <5.7)
Mean Plasma Glucose: 123 (calc)
eAG (mmol/L): 6.8 (calc)

## 2019-12-30 LAB — TSH: TSH: 1.61 mIU/L (ref 0.40–4.50)

## 2019-12-30 LAB — PSA: PSA: 0.2 ng/mL (ref <=4.0)

## 2019-12-30 NOTE — Assessment & Plan Note (Addendum)
Ok to take the statin qod due to myalgias

## 2019-12-30 NOTE — Assessment & Plan Note (Signed)
stable overall by history and exam, recent data reviewed with pt, and pt to continue medical treatment as before,  to f/u any worsening symptoms or concerns  

## 2019-12-30 NOTE — Assessment & Plan Note (Addendum)
?   Neuritis ulnar - for pain control - refer sports med  I spent 31 minutes in addition to time for CPX wellness examination in preparing to see the patient by review of recent labs, imaging and procedures, obtaining and reviewing separately obtained history, communicating with the patient and family or caregiver, ordering medications, tests or procedures, and documenting clinical information in the EHR including the differential Dx, treatment, and any further evaluation and other management of left elbow pain, left shoulder pain, left neck pain, htn, hld, hyperglycemia

## 2019-12-30 NOTE — Assessment & Plan Note (Signed)
I suspect underlying rot cuff tendonitis - - for pain control - refer sports med

## 2019-12-30 NOTE — Assessment & Plan Note (Signed)

## 2019-12-30 NOTE — Assessment & Plan Note (Signed)
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

## 2020-01-12 ENCOUNTER — Other Ambulatory Visit: Payer: Self-pay

## 2020-01-12 ENCOUNTER — Ambulatory Visit: Payer: 59 | Admitting: Family Medicine

## 2020-01-12 ENCOUNTER — Ambulatory Visit: Payer: Self-pay

## 2020-01-12 ENCOUNTER — Encounter: Payer: Self-pay | Admitting: Family Medicine

## 2020-01-12 VITALS — BP 132/92 | HR 92 | Ht 75.0 in | Wt 303.0 lb

## 2020-01-12 DIAGNOSIS — M7712 Lateral epicondylitis, left elbow: Secondary | ICD-10-CM | POA: Diagnosis not present

## 2020-01-12 DIAGNOSIS — M25512 Pain in left shoulder: Secondary | ICD-10-CM

## 2020-01-12 DIAGNOSIS — M25511 Pain in right shoulder: Secondary | ICD-10-CM

## 2020-01-12 MED ORDER — MELOXICAM 15 MG PO TABS
15.0000 mg | ORAL_TABLET | Freq: Every day | ORAL | 0 refills | Status: DC
Start: 2020-01-12 — End: 2020-02-05

## 2020-01-12 NOTE — Assessment & Plan Note (Signed)
Right lateral epicondylitis.  Discussed with patient about posture and ergonomics, and discussed which activities to do which wants to avoid.  Patient is to increase activity slowly.  Discussed topical anti-inflammatories.  Avoiding repetitive wrist extension.  Brace given today follow-up again in 4 to 8 weeks

## 2020-01-12 NOTE — Progress Notes (Signed)
[place] Phone: [phone] Subjective:   Fontaine No, am serving as a scribe for Dr. Hulan Saas. This visit occurred during the SARS-CoV-2 public health emergency.  Safety protocols were in place, including screening questions prior to the visit, additional usage of staff PPE, and extensive cleaning of exam room while observing appropriate contact time as indicated for disinfecting solutions.   I'm seeing this patient by the request  of:  Biagio Borg, MD  CC: Right shoulder pain, left elbow pain  YKD:[id number]   10/30/2019 Patient was given an injection today. Patient will start home exercises in the next 48 hours. Patient still has the meloxicam but if necessary. We discussed the icing protocol and what activities to avoid. Patient will return to work on a trial of full duty for the next 2 weeks. Patient will come back in 2 weeks for further evaluation.  Update 01/12/2020 COBURN KNAUS is a 49 y.o. male coming in with complaint of bilateral shoulder pain, R>L. Left shoulder pain started 2-3 months ago and right pain began one week ago. Pain in left is in bicep when lifting overhead. Right shoulder pain is tingling and sharp after rolling over in bed. Denies any radiating symptoms. IBU prn.        Past Medical History:  Diagnosis Date  . Anxiety state, unspecified 11/09/2012  . DEPRESSION 03/31/2007   Qualifier: Diagnosis of  By: Jenny Reichmann MD, Hunt Oris   . HYPERLIPIDEMIA 03/31/2007   Qualifier: Diagnosis of  By: Jenny Reichmann MD, Hunt Oris   . HYPERTENSION 04/10/2008   Qualifier: Diagnosis of  By: Loanne Drilling MD, Jacelyn Pi Obesity    Past Surgical History:  Procedure Laterality Date  . HERNIA REPAIR     infancy  . rotater cuff repair Right    Social History   Socioeconomic History  . Marital status: Single    Spouse name: n/a  . Number of children: 0  . Years of education: 12+  . Highest education level:  Not on file  Occupational History  . Occupation: Gaffer    Comment: [employer]  Tobacco Use  . Smoking status: Never Smoker  . Smokeless tobacco: Never Used  Substance and Sexual Activity  . Alcohol use: Yes    Alcohol/week: 0.0 standard drinks    Comment: occasionally  . Drug use: No  . Sexual activity: Not on file  Other Topics Concern  . Not on file  Social History Narrative   Lives alone. Twin brother lives next door with his wife and son.   Social Determinants of Health   Financial Resource Strain:   . Difficulty of Paying Living Expenses:   Food Insecurity:   . Worried About Charity fundraiser in the Last Year:   . Arboriculturist in the Last Year:   Transportation Needs:   . Film/video editor (Medical):   Marland Kitchen Lack of Transportation (Non-Medical):   Physical Activity:   . Days of Exercise per Week:   . Minutes of Exercise per Session:   Stress:   . Feeling of Stress :   Social Connections:   . Frequency of Communication with Friends and Family:   . Frequency of Social Gatherings with Friends and Family:   . Attends Religious Services:   . Active Member of Clubs or Organizations:   . Attends Archivist Meetings:   Marland Kitchen Marital Status:  Allergies  Allergen Reactions  . Sulfonamide Derivatives     UNKNOWN reaction   Family History  Problem Relation Age of Onset  . Hypertension Mother      Current Outpatient Medications (Cardiovascular):  .  rosuvastatin (CRESTOR) 20 MG tablet, Take 1 tablet (20 mg total) by mouth every other day.   Current Outpatient Medications (Analgesics):  Marland Kitchen  Ibuprofen-Famotidine (DUEXIS) 800-26.6 MG TABS, Take 1 tablet by mouth 3 (three) times daily as needed.   Current Outpatient Medications (Other):  .  clonazePAM (KLONOPIN) 0.5 MG tablet, TAKE 1 TABLET BY MOUTH TWICE A DAY AS NEEDED FOR ANXIETY .  zolpidem (AMBIEN CR) 12.5 MG CR tablet, Take 1 tablet (12.5 mg total) by mouth at bedtime as needed. for  sleep   Reviewed prior external information including notes and imaging from  primary care provider As well as notes that were available from care everywhere and other healthcare systems.  Past medical history, social, surgical and family history all reviewed in electronic medical record.  No pertanent information unless stated regarding to the chief complaint.   Review of Systems:  No headache, visual changes, nausea, vomiting, diarrhea, constipation, dizziness, abdominal pain, skin rash, fevers, chills, night sweats, weight loss, swollen lymph nodes, body aches, joint swelling, chest pain, shortness of breath, mood changes. POSITIVE muscle aches  Objective  There were no vitals taken for this visit.   General: No apparent distress alert and oriented x3 mood and affect normal, dressed appropriately.  HEENT: Pupils equal, extraocular movements intact  Respiratory: Patient's speak in full sentences and does not appear short of breath  Cardiovascular: No lower extremity edema, non tender, no erythema  Neuro: Cranial nerves II through XII are intact, neurovascularly intact in all extremities with 2+ DTRs and 2+ pulses.  Gait normal with good balance and coordination.  MSK: Left elbow has some tenderness to palpation over the lateral epicondylar region.  Pain with resisted extension of the wrist.   Shoulder: Right Inspection reveals no abnormalities, atrophy or asymmetry. Palpation is normal with no tenderness over AC joint or bicipital groove. ROM is full in all planes passively. Rotator cuff strength normal throughout. signs of impingement with positive Neer and Hawkin's tests, but negative empty can sign. Speeds and Yergason's tests normal. No labral pathology noted with negative Obrien's, negative clunk and good stability. Normal scapular function observed. No painful arc and no drop arm sign. No apprehension sign  MSK US performed of: Right This study was ordered, performed, and  interpreted by Charlann Boxer D.O.  Shoulder:   Supraspinatus: Very mild questionable low-grade tear noted on the inferior aspect less than 10% of tendon with no retraction bursal bulge seen with shoulder abduction on impingement view. Infraspinatus:  Appears normal on long and transverse views. Significant increase in Doppler flow Subscapularis:  Appears normal on long and transverse views. Positive bursa AC joint:  Capsule undistended, no geyser sign. Glenohumeral Joint:  Appears normal without effusion. Glenoid Labrum:  Intact without visualized tears. Biceps Tendon:  Appears normal on long and transverse views, no fraying of tendon, tendon located in intertubercular groove, no subluxation with shoulder internal or external rotation.  Impression: Subacromial bursitis  Procedure: Real-time Ultrasound Guided Injection of right glenohumeral joint Device: GE Logiq E  Ultrasound guided injection is preferred based studies that show increased duration, increased effect, greater accuracy, decreased procedural pain, increased response rate with ultrasound guided versus blind injection.  Verbal informed consent obtained.  Time-out conducted.  Noted no overlying erythema,  induration, or other signs of local infection.  Skin prepped in a sterile fashion.  Local anesthesia: Topical Ethyl chloride.  With sterile technique and under real time ultrasound guidance:  Joint visualized.  23g 1  inch needle inserted posterior approach. Pictures taken for needle placement. Patient did have injection of 2 cc of 1% lidocaine, 2 cc of 0.5% Marcaine, and 1.0 cc of Kenalog 40 mg/dL. Completed without difficulty  Pain immediately resolved suggesting accurate placement of the medication.  Advised to call if fevers/chills, erythema, induration, drainage, or persistent bleeding.  Images permanently stored and available for review in the ultrasound unit.  Impression: Technically successful ultrasound guided injection.     Impression and Recommendations:     The above documentation has been reviewed and is accurate and complete Lyndal Pulley, DO       Note: This dictation was prepared with Dragon dictation along with smaller phrase technology. Any transcriptional errors that result from this process are unintentional.

## 2020-01-12 NOTE — Assessment & Plan Note (Signed)
Difficult to assess secondary to patient's right shoulder pain and patient's body habitus.  It appears that the story does not quite match the findings.  Discussed meloxicam, topical anti-inflammatories if necessary, icing regimen Injection given today.  Hopefully that this does respond well.  Follow-up again in 4 to 8 weeks

## 2020-01-12 NOTE — Patient Instructions (Signed)
Limit overhand lifting Pennsaid 2x a day as needed Brace day and night for 2 weeks then at night for 2 weeks Meloxicam daily See me again in 5-6 weeks

## 2020-01-16 ENCOUNTER — Ambulatory Visit: Payer: 59 | Admitting: Family Medicine

## 2020-02-04 ENCOUNTER — Other Ambulatory Visit: Payer: Self-pay | Admitting: Family Medicine

## 2020-02-22 ENCOUNTER — Ambulatory Visit: Payer: 59 | Admitting: Family Medicine

## 2020-03-12 ENCOUNTER — Other Ambulatory Visit: Payer: Self-pay | Admitting: Family Medicine

## 2020-03-20 ENCOUNTER — Other Ambulatory Visit: Payer: Self-pay | Admitting: Internal Medicine

## 2020-03-20 NOTE — Telephone Encounter (Signed)
Please refill as per office routine med refill policy (all routine meds refilled for 3 mo or monthly per pt preference up to one year from last visit, then month to month grace period for 3 mo, then further med refills will have to be denied)  

## 2020-04-07 ENCOUNTER — Other Ambulatory Visit: Payer: Self-pay | Admitting: Internal Medicine

## 2020-10-16 ENCOUNTER — Other Ambulatory Visit: Payer: Self-pay | Admitting: Internal Medicine

## 2020-11-19 ENCOUNTER — Encounter: Payer: Self-pay | Admitting: Internal Medicine

## 2020-11-19 ENCOUNTER — Ambulatory Visit: Payer: 59 | Admitting: Internal Medicine

## 2020-11-19 ENCOUNTER — Other Ambulatory Visit: Payer: Self-pay

## 2020-11-19 VITALS — BP 124/80 | HR 102 | Temp 98.1°F | Ht 75.0 in | Wt 294.0 lb

## 2020-11-19 DIAGNOSIS — R062 Wheezing: Secondary | ICD-10-CM

## 2020-11-19 DIAGNOSIS — F411 Generalized anxiety disorder: Secondary | ICD-10-CM | POA: Diagnosis not present

## 2020-11-19 DIAGNOSIS — R7302 Impaired glucose tolerance (oral): Secondary | ICD-10-CM | POA: Diagnosis not present

## 2020-11-19 DIAGNOSIS — R059 Cough, unspecified: Secondary | ICD-10-CM

## 2020-11-19 MED ORDER — METHYLPREDNISOLONE ACETATE 80 MG/ML IJ SUSP
80.0000 mg | Freq: Once | INTRAMUSCULAR | Status: AC
  Administered 2020-11-19: 80 mg via INTRAMUSCULAR

## 2020-11-19 MED ORDER — AZITHROMYCIN 250 MG PO TABS: ORAL_TABLET | ORAL | 1 refills | Status: DC

## 2020-11-19 MED ORDER — METHYLPREDNISOLONE 4 MG PO TBPK: ORAL_TABLET | ORAL | 0 refills | Status: DC

## 2020-11-19 MED ORDER — HYDROCODONE BIT-HOMATROP MBR 5-1.5 MG/5ML PO SOLN: 5.0000 mL | Freq: Four times a day (QID) | ORAL | 0 refills | Status: AC | PRN

## 2020-11-19 MED ORDER — ALBUTEROL SULFATE HFA 108 (90 BASE) MCG/ACT IN AERS: 2.0000 | INHALATION_SPRAY | Freq: Four times a day (QID) | RESPIRATORY_TRACT | 2 refills | Status: DC | PRN

## 2020-11-19 NOTE — Patient Instructions (Signed)
You had the steroid shot today  Please take all new medication as prescribed - the antibiotic, cough medicine, medrol pack, and inhaler as needed  Please continue all other medications as before, and refills have been done if requested.  Please have the pharmacy call with any other refills you may need.  Please keep your appointments with your specialists as you may have planned

## 2020-11-19 NOTE — Progress Notes (Signed)
Patient ID: Robert Lloyd, male   DOB: [birth year], 50 y.o.   MRN: [id number]        Chief Complaint: 2 days cough and wheezing       HPI:  Robert Lloyd is a 50 y.o. male here with above, and temp at home up to 101, with hoarsness, scant prod cough, and mild sob/doe.  Pt denies chest pain, orthopnea, PND, increased LE swelling, palpitations, dizziness or syncope.  Pt denies polydipsia, polyuria, or new focal neuro s/s.  Denies worsening depressive symptoms, suicidal ideation, or panic       Wt Readings from Last 3 Encounters:  11/19/20 294 lb (133.4 kg)  01/12/20 (!) 303 lb (137.4 kg)  12/29/19 297 lb (134.7 kg)   BP Readings from Last 3 Encounters:  11/19/20 124/80  01/12/20 (!) 132/92  12/29/19 130/86         Past Medical History:  Diagnosis Date  . Anxiety state, unspecified 11/09/2012  . DEPRESSION 03/31/2007   Qualifier: Diagnosis of  By: Jenny Reichmann MD, Hunt Oris   . HYPERLIPIDEMIA 03/31/2007   Qualifier: Diagnosis of  By: Jenny Reichmann MD, Hunt Oris   . HYPERTENSION 04/10/2008   Qualifier: Diagnosis of  By: Loanne Drilling MD, Jacelyn Pi Obesity    Past Surgical History:  Procedure Laterality Date  . HERNIA REPAIR     infancy  . rotater cuff repair Right     reports that he has never smoked. He has never used smokeless tobacco. He reports current alcohol use. He reports that he does not use drugs. family history includes Hypertension in his mother. Allergies  Allergen Reactions  . Sulfonamide Derivatives     UNKNOWN reaction   Current Outpatient Medications on File Prior to Visit  Medication Sig Dispense Refill  . clonazePAM (KLONOPIN) 0.5 MG tablet TAKE 1 TABLET BY MOUTH TWICE A DAY AS NEEDED FOR ANXIETY 60 tablet 2  . Ibuprofen-Famotidine (DUEXIS) 800-26.6 MG TABS Take 1 tablet by mouth 3 (three) times daily as needed. 90 tablet 1  . meloxicam (MOBIC) 15 MG tablet TAKE 1 TABLET BY MOUTH EVERY DAY 30 tablet 0  . rosuvastatin (CRESTOR) 20 MG tablet TAKE 1 TABLET BY MOUTH EVERY DAY 90  tablet 2  . zolpidem (AMBIEN CR) 12.5 MG CR tablet TAKE 1 TABLET (12.5 MG TOTAL) BY MOUTH AT BEDTIME AS NEEDED. FOR SLEEP 90 tablet 0   No current facility-administered medications on file prior to visit.        ROS:  All others reviewed and negative.  Objective        PE:  BP 124/80 (BP Location: Left Arm, Patient Position: Sitting, Cuff Size: Large)   Pulse (!) 102   Temp 98.1 F (36.7 C) (Oral)   Ht 6\' 3"  (1.905 m)   Wt 294 lb (133.4 kg)   SpO2 94%   BMI 36.75 kg/m                 Constitutional: Pt appears in NAD               HENT: Head: NCAT.  Bilat tm's with mild erythema.  Max sinus areas none tender.  Pharynx with mild erythema, no exudate               Right Ear: External ear normal.                 Left Ear: External ear normal.  Eyes: . Pupils are equal, round, and reactive to light. Conjunctivae and EOM are normal               Nose: without d/c or deformity               Neck: Neck supple. Gross normal ROM               Cardiovascular: Normal rate and regular rhythm.                 Pulmonary/Chest: Effort normal and breath sounds decreased without rales and bilat mild wheezing.                Abd:  Soft, NT, ND, + BS, no organomegaly               Neurological: Pt is alert. At baseline orientation, motor grossly intact               Skin: Skin is warm. No rashes, no other new lesions, LE edema - none               Psychiatric: Pt behavior is normal without agitation   Micro: none  Cardiac tracings I have personally interpreted today:  none  Pertinent Radiological findings (summarize): none   Lab Results  Component Value Date   WBC 7.9 12/29/2019   HGB 14.0 12/29/2019   HCT 43.5 12/29/2019   PLT 220 12/29/2019   GLUCOSE 126 (H) 12/29/2019   CHOL 145 12/29/2019   TRIG 184 (H) 12/29/2019   HDL 37 (L) 12/29/2019   LDLDIRECT 160.0 03/15/2018   LDLCALC 80 12/29/2019   ALT 25 12/29/2019   AST 19 12/29/2019   NA 138 12/29/2019   K 4.4  12/29/2019   CL 102 12/29/2019   CREATININE 0.76 12/29/2019   BUN 15 12/29/2019   CO2 26 12/29/2019   TSH 1.61 12/29/2019   PSA 0.2 12/29/2019   HGBA1C 5.9 (H) 12/29/2019   Assessment/Plan:  Robert Lloyd is a 50 y.o. White or Caucasian [1] male with  has a past medical history of Anxiety state, unspecified (11/09/2012), DEPRESSION (03/31/2007), HYPERLIPIDEMIA (03/31/2007), HYPERTENSION (04/10/2008), and Obesity.  Cough Mild to mod, c/w bronchitis vs pna, for antibx course, cough med prn, declines cxr, to f/u any worsening symptoms or concerns  Wheezing Mild to mod, for depomedrol IM 80, predpac asd, cough med prn, inhaler prn,  to f/u any worsening symptoms or concerns  Impaired glucose tolerance Lab Results  Component Value Date   HGBA1C 5.9 (H) 12/29/2019   Stable, pt to continue current medical treatment  - diet   Anxiety state Stable, cont current med tx klonopin prn, declnies need for change in tx  Followup: Return if symptoms worsen or fail to improve.  [name], MD 11/19/2020 7:34 PM [facility]

## 2020-11-19 NOTE — Assessment & Plan Note (Signed)
Mild to mod, for depomedrol IM 80, predpac asd, cough med prn, inhaler prn,  to f/u any worsening symptoms or concerns

## 2020-11-19 NOTE — Assessment & Plan Note (Signed)
Mild to mod,c/w bronchitis vs pna, for antibx course, cough med prn, declines cxr, to f/u any worsening symptoms or concerns 

## 2020-11-19 NOTE — Assessment & Plan Note (Signed)
Lab Results  Component Value Date   HGBA1C 5.9 (H) 12/29/2019   Stable, pt to continue current medical treatment  - diet

## 2020-11-19 NOTE — Assessment & Plan Note (Signed)
Stable, cont current med tx klonopin prn, declnies need for change in tx

## 2020-12-04 ENCOUNTER — Ambulatory Visit (INDEPENDENT_AMBULATORY_CARE_PROVIDER_SITE_OTHER): Payer: 59 | Admitting: Internal Medicine

## 2020-12-04 ENCOUNTER — Other Ambulatory Visit: Payer: Self-pay

## 2020-12-04 ENCOUNTER — Encounter: Payer: Self-pay | Admitting: Internal Medicine

## 2020-12-04 VITALS — BP 138/84 | HR 98 | Temp 97.9°F | Ht 75.0 in | Wt 297.0 lb

## 2020-12-04 DIAGNOSIS — M5416 Radiculopathy, lumbar region: Secondary | ICD-10-CM | POA: Diagnosis not present

## 2020-12-04 DIAGNOSIS — E538 Deficiency of other specified B group vitamins: Secondary | ICD-10-CM | POA: Diagnosis not present

## 2020-12-04 DIAGNOSIS — E559 Vitamin D deficiency, unspecified: Secondary | ICD-10-CM

## 2020-12-04 DIAGNOSIS — Z0001 Encounter for general adult medical examination with abnormal findings: Secondary | ICD-10-CM | POA: Diagnosis not present

## 2020-12-04 DIAGNOSIS — I10 Essential (primary) hypertension: Secondary | ICD-10-CM

## 2020-12-04 DIAGNOSIS — E7800 Pure hypercholesterolemia, unspecified: Secondary | ICD-10-CM | POA: Diagnosis not present

## 2020-12-04 DIAGNOSIS — R109 Unspecified abdominal pain: Secondary | ICD-10-CM | POA: Diagnosis not present

## 2020-12-04 DIAGNOSIS — Z1159 Encounter for screening for other viral diseases: Secondary | ICD-10-CM

## 2020-12-04 DIAGNOSIS — R7302 Impaired glucose tolerance (oral): Secondary | ICD-10-CM | POA: Diagnosis not present

## 2020-12-04 DIAGNOSIS — M7062 Trochanteric bursitis, left hip: Secondary | ICD-10-CM

## 2020-12-04 DIAGNOSIS — F32A Depression, unspecified: Secondary | ICD-10-CM

## 2020-12-04 DIAGNOSIS — M7072 Other bursitis of hip, left hip: Secondary | ICD-10-CM | POA: Insufficient documentation

## 2020-12-04 DIAGNOSIS — Z1211 Encounter for screening for malignant neoplasm of colon: Secondary | ICD-10-CM

## 2020-12-04 LAB — BASIC METABOLIC PANEL
BUN: 17 mg/dL (ref 6–23)
CO2: 29 mEq/L (ref 19–32)
Calcium: 9.5 mg/dL (ref 8.4–10.5)
Chloride: 103 mEq/L (ref 96–112)
Creatinine, Ser: 0.74 mg/dL (ref 0.40–1.50)
GFR: 105.87 mL/min (ref >60.00)
Glucose, Bld: 116 mg/dL — ABNORMAL HIGH (ref 70–99)
Potassium: 4.1 mEq/L (ref 3.5–5.1)
Sodium: 140 mEq/L (ref 135–145)

## 2020-12-04 LAB — LIPID PANEL
Cholesterol: 186 mg/dL (ref 0–200)
HDL: 46.1 mg/dL (ref >39.00)
LDL Cholesterol: 108 mg/dL — ABNORMAL HIGH (ref 0–99)
NonHDL: 140.28
Total CHOL/HDL Ratio: 4
Triglycerides: 162 mg/dL — ABNORMAL HIGH (ref 0.0–149.0)
VLDL: 32.4 mg/dL (ref 0.0–40.0)

## 2020-12-04 LAB — HEPATIC FUNCTION PANEL
ALT: 24 U/L (ref 0–53)
AST: 17 U/L (ref 0–37)
Albumin: 4.1 g/dL (ref 3.5–5.2)
Alkaline Phosphatase: 76 U/L (ref 39–117)
Bilirubin, Direct: 0.1 mg/dL (ref 0.0–0.3)
Total Bilirubin: 0.4 mg/dL (ref 0.2–1.2)
Total Protein: 7.3 g/dL (ref 6.0–8.3)

## 2020-12-04 LAB — URINALYSIS, ROUTINE W REFLEX MICROSCOPIC
Bilirubin Urine: NEGATIVE
Ketones, ur: NEGATIVE
Leukocytes,Ua: NEGATIVE
Nitrite: NEGATIVE
Specific Gravity, Urine: >=1.03 — AB (ref 1.000–1.030)
Total Protein, Urine: NEGATIVE
Urine Glucose: NEGATIVE
Urobilinogen, UA: 0.2 (ref 0.0–1.0)
pH: 6 (ref 5.0–8.0)

## 2020-12-04 LAB — CBC WITH DIFFERENTIAL/PLATELET
Basophils Absolute: 0.1 10*3/uL (ref 0.0–0.1)
Basophils Relative: 0.9 % (ref 0.0–3.0)
Eosinophils Absolute: 0.1 10*3/uL (ref 0.0–0.7)
Eosinophils Relative: 1.7 % (ref 0.0–5.0)
HCT: 40.5 % (ref 39.0–52.0)
Hemoglobin: 13.8 g/dL (ref 13.0–17.0)
Lymphocytes Relative: 25.9 % (ref 12.0–46.0)
Lymphs Abs: 2.2 10*3/uL (ref 0.7–4.0)
MCHC: 34 g/dL (ref 30.0–36.0)
MCV: 86.2 fl (ref 78.0–100.0)
Monocytes Absolute: 0.8 10*3/uL (ref 0.1–1.0)
Monocytes Relative: 9.8 % (ref 3.0–12.0)
Neutro Abs: 5.2 10*3/uL (ref 1.4–7.7)
Neutrophils Relative %: 61.7 % (ref 43.0–77.0)
Platelets: 213 10*3/uL (ref 150.0–400.0)
RBC: 4.7 Mil/uL (ref 4.22–5.81)
RDW: 14.3 % (ref 11.5–15.5)
WBC: 8.4 10*3/uL (ref 4.0–10.5)

## 2020-12-04 LAB — PSA: PSA: 0.24 ng/mL (ref 0.10–4.00)

## 2020-12-04 LAB — HEMOGLOBIN A1C: Hgb A1c MFr Bld: 6.6 % — ABNORMAL HIGH (ref 4.6–6.5)

## 2020-12-04 LAB — VITAMIN B12: Vitamin B-12: 670 pg/mL (ref 211–911)

## 2020-12-04 LAB — VITAMIN D 25 HYDROXY (VIT D DEFICIENCY, FRACTURES): VITD: 43 ng/mL (ref 30.00–100.00)

## 2020-12-04 LAB — TSH: TSH: 1.59 u[IU]/mL (ref 0.35–4.50)

## 2020-12-04 MED ORDER — CYCLOBENZAPRINE HCL 5 MG PO TABS: 5.0000 mg | ORAL_TABLET | Freq: Three times a day (TID) | ORAL | 1 refills | Status: DC | PRN

## 2020-12-04 MED ORDER — ROSUVASTATIN CALCIUM 20 MG PO TABS
20.0000 mg | ORAL_TABLET | ORAL | 3 refills | Status: DC
Start: 2020-12-04 — End: 2021-12-12

## 2020-12-04 MED ORDER — TRAMADOL HCL 50 MG PO TABS: 50.0000 mg | ORAL_TABLET | Freq: Four times a day (QID) | ORAL | 0 refills | Status: DC | PRN

## 2020-12-04 MED ORDER — METHYLPREDNISOLONE 4 MG PO TBPK: ORAL_TABLET | ORAL | 0 refills | Status: DC

## 2020-12-04 NOTE — Progress Notes (Signed)
Patient ID: Robert Lloyd, male   DOB: Jan 10, 1971, 50 y.o.   MRN: [id number]         Chief Complaint:: wellness exam and Back Pain (Back pain started a little over a week when patient cough and back has been worsening every since. Back feels like sharp stabbing pain. Also experiencing leg numbness right side.) and Numbness         HPI:  Robert Lloyd is a 50 y.o. male here for wellness exam; due for colonoscopy, declines shingrix and covid vax, o/w up to date with preventive referral and immunizations                        Also c/o new onset 1 wk low back pain on the right, mod to severe, constant, sharp stabbing associated with numbness pain and mild weakness to the right leg. With nothing else makes better or worse.  Also has a separate issue of right flank tender, but Denies urinary symptoms such as dysuria, frequency, urgency, flank pain, hematuria or n/v, fever, chills.  Also had a 1 day of bilateral post neck pain and stiffness after working a whole day on the computer with looking downwards while seated.  Also has 1 mo worsening tender sharp left hip pain and tender worse to lie on the left hip, without radiation, worse to lie on the left side, nothing else makes better or worse.  States is taking the statin QOD and seems to be tolerating this well.  Denies worsening depressive symptoms, suicidal ideation, or panic; but does have at least mild chronic persistent depressive symptoms.   Pt denies fever, wt loss, night sweats, loss of appetite, or other constitutional symptoms     Wt Readings from Last 3 Encounters:  12/04/20 297 lb (134.7 kg)  11/19/20 294 lb (133.4 kg)  01/12/20 (!) 303 lb (137.4 kg)   BP Readings from Last 3 Encounters:  12/04/20 138/84  11/19/20 124/80  01/12/20 (!) 132/92   Immunization History  Administered Date(s) Administered   Influenza,inj,Quad PF,6+ Mos 03/09/2014, 05/24/2015, 03/17/2016, 03/15/2018, 03/06/2019   Tdap 03/09/2014   Health Maintenance Due   Topic Date Due   COLONOSCOPY (Pts 45-37yrs Insurance coverage will need to be confirmed)  Never done      Past Medical History:  Diagnosis Date   Anxiety state, unspecified 11/09/2012   DEPRESSION 03/31/2007   Qualifier: Diagnosis of  By: Jenny Reichmann MD, Hunt Oris    HYPERLIPIDEMIA 03/31/2007   Qualifier: Diagnosis of  By: Jenny Reichmann MD, Hunt Oris    HYPERTENSION 04/10/2008   Qualifier: Diagnosis of  By: Loanne Drilling MD, Sean A    Obesity    Past Surgical History:  Procedure Laterality Date   HERNIA REPAIR     infancy   rotater cuff repair Right     reports that he has never smoked. He has never used smokeless tobacco. He reports current alcohol use. He reports that he does not use drugs. family history includes Hypertension in his mother. Allergies  Allergen Reactions   Sulfonamide Derivatives     UNKNOWN reaction   Current Outpatient Medications on File Prior to Visit  Medication Sig Dispense Refill   albuterol (VENTOLIN HFA) 108 (90 Base) MCG/ACT inhaler Inhale 2 puffs into the lungs every 6 (six) hours as needed for wheezing or shortness of breath. 8 g 2   clonazePAM (KLONOPIN) 0.5 MG tablet TAKE 1 TABLET BY MOUTH TWICE A DAY AS NEEDED FOR ANXIETY 60  tablet 2   Ibuprofen-Famotidine (DUEXIS) 800-26.6 MG TABS Take 1 tablet by mouth 3 (three) times daily as needed. 90 tablet 1   meloxicam (MOBIC) 15 MG tablet TAKE 1 TABLET BY MOUTH EVERY DAY 30 tablet 0   zolpidem (AMBIEN CR) 12.5 MG CR tablet TAKE 1 TABLET (12.5 MG TOTAL) BY MOUTH AT BEDTIME AS NEEDED. FOR SLEEP 90 tablet 0   No current facility-administered medications on file prior to visit.        ROS:  All others reviewed and negative.  Objective        PE:  BP 138/84 (BP Location: Left Arm, Patient Position: Sitting, Cuff Size: Large)   Pulse 98   Temp 97.9 F (36.6 C) (Oral)   Ht 6\' 3"  (1.905 m)   Wt 297 lb (134.7 kg)   SpO2 99%   BMI 37.12 kg/m                 Constitutional: Pt appears in NAD               HENT: Head: NCAT.                 Right Ear: External ear normal.                 Left Ear: External ear normal.                Eyes: . Pupils are equal, round, and reactive to light. Conjunctivae and EOM are normal               Nose: without d/c or deformity               Neck: Neck supple. Gross normal ROM               Cardiovascular: Normal rate and regular rhythm.                 Pulmonary/Chest: Effort normal and breath sounds without rales or wheezing.                Abd:  Soft, NT, ND, + BS, no organomegaly               Neurological: Pt is alert. At baseline orientation, motor grossly intact except 4+/5 RLE weakness               Skin: Skin is warm. No rashes, no other new lesions, LE edema - none               Psychiatric: Pt behavior is normal without agitation   Micro: none  Cardiac tracings I have personally interpreted today:  none  Pertinent Radiological findings (summarize): none   Lab Results  Component Value Date   WBC 8.4 12/04/2020   HGB 13.8 12/04/2020   HCT 40.5 12/04/2020   PLT 213.0 12/04/2020   GLUCOSE 116 (H) 12/04/2020   CHOL 186 12/04/2020   TRIG 162.0 (H) 12/04/2020   HDL 46.10 12/04/2020   LDLDIRECT 160.0 03/15/2018   LDLCALC 108 (H) 12/04/2020   ALT 24 12/04/2020   AST 17 12/04/2020   NA 140 12/04/2020   K 4.1 12/04/2020   CL 103 12/04/2020   CREATININE 0.74 12/04/2020   BUN 17 12/04/2020   CO2 29 12/04/2020   TSH 1.59 12/04/2020   PSA 0.24 12/04/2020   HGBA1C 6.6 (H) 12/04/2020   Assessment/Plan:  Robert Lloyd is a 50 y.o. White or Caucasian [1] male with  has a past medical history of Anxiety state, unspecified (11/09/2012), DEPRESSION (03/31/2007), HYPERLIPIDEMIA (03/31/2007), HYPERTENSION (04/10/2008), and Obesity.  Encounter for well adult exam with abnormal findings Age and sex appropriate education and counseling updated with regular exercise and diet Referrals for preventative services - for colonoscopy Immunizations addressed - declines  shingrix and covid vax Smoking counseling  - none needed Evidence for depression or other mood disorder - chronic stable persistent mild depresiion stable Most recent labs reviewed. I have personally reviewed and have noted: 1) the patient's medical and social history 2) The patient's current medications and supplements 3) The patient's height, weight, and BMI have been recorded in the chart   Depression Chronic mild persistent stable, pt declines change in tx or counseling for now  Essential hypertension BP Readings from Last 3 Encounters:  12/04/20 138/84  11/19/20 124/80  01/12/20 (!) 132/92   Stable, pt to continue medical treatment  - low salt diet, wt control   Hyperlipidemia Lab Results  Component Value Date   LDLCALC 108 (H) 12/04/2020   Stable, pt to continue current statin qod as this is all he tolerates - crestor 20 qod   Impaired glucose tolerance Lab Results  Component Value Date   HGBA1C 6.6 (H) 12/04/2020   Mild uncontrolled, pt to continue current medical treatment for now including wt control and diet, with overall goal < 7  Right flank pain C/w msk strain - for tylenol prn  Right lumbar radiculopathy Mod to severe, for MRI LS spine, tramadol prn, flexeril prn, predpac asd  Bursitis of left hip Mild to mod, pt encourage to f/u with sports medicine for possible cortisone  Followup: Return in about 6 months (around 06/05/2021).  [name], MD 12/08/2020 7:33 PM [facility]

## 2020-12-04 NOTE — Patient Instructions (Signed)
Please take all new medication as prescribed - the tramadol for pain, cyclobenzaprine for muscle relaxer, and medrol for the spine sweling  Please continue all other medications as before, and refills have been done if requested.  Please have the pharmacy call with any other refills you may need.  Please continue your efforts at being more active, low cholesterol diet, and weight control.  You are otherwise up to date with prevention measures today.  Please keep your appointments with your specialists as you may have planned  You will be contacted regarding the referral for: MRI for the lower back  Please go to the LAB at the blood drawing area for the tests to be done  You will be contacted by phone if any changes need to be made immediately.  Otherwise, you will receive a letter about your results with an explanation, but please check with MyChart first.  Please remember to sign up for MyChart if you have not done so, as this will be important to you in the future with finding out test results, communicating by private email, and scheduling acute appointments online when needed.  Please make an Appointment to return in 6 months, or sooner if needed

## 2020-12-05 LAB — HEPATITIS C ANTIBODY
Hepatitis C Ab: NONREACTIVE
SIGNAL TO CUT-OFF: 0.01 (ref <1.00)

## 2020-12-08 ENCOUNTER — Encounter: Payer: Self-pay | Admitting: Internal Medicine

## 2020-12-08 NOTE — Assessment & Plan Note (Signed)
Chronic mild persistent stable, pt declines change in tx or counseling for now

## 2020-12-08 NOTE — Assessment & Plan Note (Signed)
C/w msk strain - for tylenol prn

## 2020-12-08 NOTE — Assessment & Plan Note (Signed)
Lab Results  Component Value Date   HGBA1C 6.6 (H) 12/04/2020   Mild uncontrolled, pt to continue current medical treatment for now including wt control and diet, with overall goal < 7

## 2020-12-08 NOTE — Assessment & Plan Note (Signed)
Lab Results  Component Value Date   LDLCALC 108 (H) 12/04/2020   Stable, pt to continue current statin qod as this is all he tolerates - crestor 20 qod

## 2020-12-08 NOTE — Assessment & Plan Note (Signed)
Mod to severe, for MRI LS spine, tramadol prn, flexeril prn, predpac asd

## 2020-12-08 NOTE — Assessment & Plan Note (Signed)
Age and sex appropriate education and counseling updated with regular exercise and diet Referrals for preventative services - for colonoscopy Immunizations addressed - declines shingrix and covid vax Smoking counseling  - none needed Evidence for depression or other mood disorder - chronic stable persistent mild depresiion stable Most recent labs reviewed. I have personally reviewed and have noted: 1) the patient's medical and social history 2) The patient's current medications and supplements 3) The patient's height, weight, and BMI have been recorded in the chart

## 2020-12-08 NOTE — Assessment & Plan Note (Signed)
Mild to mod, pt encourage to f/u with sports medicine for possible cortisone

## 2020-12-08 NOTE — Assessment & Plan Note (Signed)
BP Readings from Last 3 Encounters:  12/04/20 138/84  11/19/20 124/80  01/12/20 (!) 132/92   Stable, pt to continue medical treatment  - low salt diet, wt control

## 2020-12-11 NOTE — Progress Notes (Signed)
    Robert Lloyd is a 50 y.o. male who presents to [place] at [facility] today for low back pain.  He was last seen by Dr. Tamala Lloyd on 01/12/20 for B shoulder pain.  Since then, he saw his PCP on 12/04/20 w/ c/o low back pain and R LE numbness and an L-spine MRI order was placed.  He reports R-sided LBP x approximately 2 weeks w/ no known MOI.  He locates his pain to his R lower back.  Radiating pain: no R LE numbness/tingling: yes into his R thigh Aggravating factors: any quick movement Treatments tried: Tramadol; Cyclobenzaprine  He is also c/o L hip pain x 2-3 months that has been progressively worse.  He locates his pain to his L lateral hip.  He is having constant pain that is worsened w/ walking and L-sidelying.  Diagnostic testing: L-spine MRI ordered; L-spine XR- 10/23/16  Pertinent review of systems: no fever or chills  Relevant historical information: HTN,    Exam:  BP 120/84 (BP Location: Right Arm, Patient Position: Sitting, Cuff Size: Large)   Pulse 91   Ht 6\' 3"  (1.905 m)   Wt 299 lb 6.4 oz (135.8 kg)   SpO2 96%   BMI 37.42 kg/m  General: Well Developed, well nourished, and in no acute distress.   MSK: L-spine nontender midline.  Normal lumbar motion.  Negative slump test and straight leg raise test right leg. Lower extremity strength right-sided intact. Left hip normal. Normal motion. Tender palpation greater trochanter. Hip abduction and external rotation strength diminished with pain. Painful crossover stretch and figure 4 stretch.   Hip greater trochanteric injection: left Consent obtained and timeout performed. Area of maximum tenderness palpated and identified. Skin cleaned with alcohol, cold spray applied. A spinal needle was used to access the greater trochanteric bursa. 40 mg of Kenalog and 2 mL of Marcaine were used to inject the trochanteric bursa. Patient tolerated the procedure well.   Assessment and Plan: 50 y.o. male  with left lateral hip pain due to hip abductor tendinopathy/trochanteric bursitis.  Plan for steroid injection today and referral to physical therapy.  Patient has contralateral right sided what is thought to be L3 lumbar radiculopathy and low back pain.  He has a upcoming lumbar MRI scheduled for July 6.  Recommend that he schedule a follow-up appointment with myself or my partner Dr. Tamala Lloyd early the week after July 6 to go over the upcoming MRI and plan for treatment for that issue.  Return for recheck sooner if needed.   PDMP not reviewed this encounter. Orders Placed This Encounter  Procedures   Ambulatory referral to Physical Therapy    Referral Priority:   Routine    Referral Type:   Physical Medicine    Referral Reason:   Specialty Services Required    Requested Specialty:   Physical Therapy    Number of Visits Requested:   1   No orders of the defined types were placed in this encounter.    Discussed warning signs or symptoms. Please see discharge instructions. Patient expresses understanding.   The above documentation has been reviewed and is accurate and complete Lynne Leader, M.D.

## 2020-12-12 ENCOUNTER — Other Ambulatory Visit: Payer: Self-pay

## 2020-12-12 ENCOUNTER — Ambulatory Visit: Payer: 59 | Admitting: Family Medicine

## 2020-12-12 ENCOUNTER — Encounter: Payer: Self-pay | Admitting: Family Medicine

## 2020-12-12 ENCOUNTER — Ambulatory Visit: Payer: 59 | Admitting: Internal Medicine

## 2020-12-12 VITALS — BP 120/84 | HR 91 | Ht 75.0 in | Wt 299.4 lb

## 2020-12-12 DIAGNOSIS — M5441 Lumbago with sciatica, right side: Secondary | ICD-10-CM

## 2020-12-12 DIAGNOSIS — M7062 Trochanteric bursitis, left hip: Secondary | ICD-10-CM | POA: Diagnosis not present

## 2020-12-12 NOTE — Patient Instructions (Addendum)
Thank you for coming in today.   Call or go to the ER if you develop a large red swollen joint with extreme pain or oozing puss.    I've referred you to Physical Therapy.  Let us know if you don't hear from them in one week.   Schedule with me or Dr Tamala Julian early the week after July 6 to review the MRI lumbar spine and plan for treatment for the right thigh and low back.

## 2020-12-17 ENCOUNTER — Telehealth: Payer: Self-pay | Admitting: Internal Medicine

## 2020-12-17 NOTE — Telephone Encounter (Signed)
Type of form received: FMLA  Additional comments:Please fax the form and let Hawke know when form is completed and scanned Patient states he needs the FMLA for his therapy appointments, he will still be working   Received by:  Warren Lacy  Form should be Faxed to:  [phone]  Form should be mailed to:  n/a  Is patient requesting call for pickup: phone call [phone]   Form placed in the Provider's box.   Was patient informed of  7-10 business day turn around (Y/N)? Darreld Mclean

## 2020-12-18 ENCOUNTER — Ambulatory Visit
Admission: RE | Admit: 2020-12-18 | Discharge: 2020-12-18 | Disposition: A | Discharge status: Home / Self Care | Payer: 59 | Source: Ambulatory Visit | Attending: Internal Medicine | Admitting: Internal Medicine

## 2020-12-18 ENCOUNTER — Other Ambulatory Visit: Payer: Self-pay

## 2020-12-18 DIAGNOSIS — M5416 Radiculopathy, lumbar region: Secondary | ICD-10-CM

## 2020-12-19 ENCOUNTER — Encounter: Payer: Self-pay | Admitting: Internal Medicine

## 2020-12-19 NOTE — Telephone Encounter (Signed)
   Patient called and said that he would like additional days. He said that the pain has progressed. He is wanting additional days in case he may need to be out of work due to pain. Please advise

## 2020-12-20 NOTE — Progress Notes (Deleted)
   I, Robert Lloyd, LAT, ATC, am serving as scribe for Dr. Lynne Leader.  Robert Lloyd is a 50 y.o. male who presents to [place] at [facility] today for f/u of R-sided LBP and L-spine MRI review.  He was last seen by Dr. Georgina Snell on 12/12/20 and noted R-sided LBP w/ R thigh numbness and L lateral hip pain.  He had a L GT injection and was referred to PT at [facility].  Since his last visit, pt reports  Diagnostic testing: L-spine MRI- 12/18/20; L-spine XR- 10/23/16   Pertinent review of systems: ***  Relevant historical information: ***   Exam:  There were no vitals taken for this visit. General: Well Developed, well nourished, and in no acute distress.   MSK: ***    Lab and Radiology Results No results found for this or any previous visit (from the past 72 hour(s)). MR Lumbar Spine Wo Contrast  Result Date: 12/18/2020 CLINICAL DATA:  Chronic low back pain and right leg numbness EXAM: MRI LUMBAR SPINE WITHOUT CONTRAST TECHNIQUE: Multiplanar, multisequence MR imaging of the lumbar spine was performed. No intravenous contrast was administered. COMPARISON:  X-ray 10/23/2016 FINDINGS: Segmentation:  Standard. Alignment:  Trace retrolisthesis of L5 on S1. Vertebrae:  No fracture, evidence of discitis, or bone lesion. Conus medullaris and cauda equina: Conus extends to the L1 level. Conus and cauda equina appear normal. Paraspinal and other soft tissues: Negative. Disc levels: T12-L1: Shallow left paracentral disc protrusion. Unremarkable facet joints. No foraminal or canal stenosis. L1-L2: Shallow central disc protrusion. Unremarkable facet joints. No foraminal or canal stenosis. L2-L3: Unremarkable. L3-L4: Unremarkable. L4-L5: Mild circumferential disc bulge with small left foraminal/extraforaminal disc osteophyte complex. Mild facet hypertrophy. Slight impress upon the ventral thecal sac and mild bilateral subarticular recess narrowing. No canal stenosis. Mild left foraminal  stenosis. L5-S1: Disc height loss with mild diffuse disc osteophyte complex. Mild facet hypertrophy. Mild bilateral subarticular recess narrowing. No canal stenosis. There is mass effect upon the bilateral exited L5 nerve roots secondary to osteophytic endplate ridging. No stenosis within either neural foramina. IMPRESSION: 1. Mild degenerative changes at L4-L5 and L5-S1 with mild bilateral subarticular recess narrowing at both levels. 2. Mild left foraminal stenosis at L4-L5. 3. No significant canal stenosis at any level. Electronically Signed   By: Davina Poke D.O.   On: 12/18/2020 12:28       Assessment and Plan: 50 y.o. male with ***   PDMP not reviewed this encounter. No orders of the defined types were placed in this encounter.  No orders of the defined types were placed in this encounter.    Discussed warning signs or symptoms. Please see discharge instructions. Patient expresses understanding.   ***

## 2020-12-20 NOTE — Telephone Encounter (Signed)
Left message for patient to call me back. 

## 2020-12-20 NOTE — Telephone Encounter (Signed)
Patient notified

## 2020-12-20 NOTE — Telephone Encounter (Signed)
I have no idea what this means without specific dates  Also I see where pt was recommended to f/u with sports med after the MRI so perhaps this can be addressed at that time

## 2020-12-23 ENCOUNTER — Ambulatory Visit: Payer: 59 | Admitting: Family Medicine

## 2020-12-23 ENCOUNTER — Telehealth: Payer: Self-pay | Admitting: Family Medicine

## 2020-12-23 NOTE — Telephone Encounter (Signed)
Per a note from Dr Georgina Snell in other telephone note, "Your MRI does show some potential for a pinched nerve. I am happy to complete FMLA if needed."  If Dr Jenny Reichmann would like for Dr Georgina Snell to complete the FMLA, please let me know.

## 2020-12-23 NOTE — Telephone Encounter (Signed)
Message sent to Dr Jenny Reichmann to confirm if we need to complete paperwork. Will contact patient.

## 2020-12-23 NOTE — Telephone Encounter (Signed)
Patient had an appointment with sports medicine but the provider is out of the office.   Patient still needs his FMLA paperwork filled out in regards to the therapy he has been going to. He states that him & Dr.John were to discuss the paperwork after his follow up visit with sports medicine, but now that is cancelled.  Please advise if this will still be completed for the pt.   [phone]

## 2020-12-23 NOTE — Telephone Encounter (Signed)
Patient was scheduled with Dr Georgina Snell this morning to review his recent MRI. This had to be canceled due to Dr Georgina Snell being out. Patient said that he did not know what he was going to do at this point. Patient said that he is needing FMLA completed from Dr Jenny Reichmann due to having to leave work for therapy. Dr Jenny Reichmann said that he would fill it out after the patient saw Dr Georgina Snell to review the MRI and see what the current plan would be at this point. Patient said that he is scheduled for therapy tomorrow so he will have to leave work two hours early and they are needing his FMLA to cover his time away.  Please advise.

## 2020-12-23 NOTE — Telephone Encounter (Signed)
I am sorry to cancel at the last min but I am sick and could not safely be in the office. Your MRI does show some potential for a pinched nerve. I am happy to complete FMLA if needed. \  Please send it to me and reschedule the follow up appointment with me.   Ellard Artis

## 2020-12-25 NOTE — Telephone Encounter (Signed)
Forms are in Dr [employer] box.

## 2020-12-25 NOTE — Telephone Encounter (Signed)
   Patient is requesting a call back in regards to the status of FMLA paperwork

## 2020-12-25 NOTE — Telephone Encounter (Signed)
Paperwork was requested from Dr Georgina Snell office and taken down.

## 2020-12-25 NOTE — Telephone Encounter (Signed)
Forms have been given to Dr Georgina Snell for completion.

## 2020-12-26 NOTE — Telephone Encounter (Signed)
Forms have been completed and faxed. Patient is aware.

## 2020-12-30 NOTE — Progress Notes (Signed)
Robert Lloyd, am serving as a Education administrator for Dr. Lynne Leader.    Robert Lloyd is a 50 y.o. male who presents to [place] at [facility] today for f/u L lateral hip pain due to hip abductor tendinopathy/trochanteric bursitis and L3 lumbar radiculopathy. Pt was last seen by Dr. Georgina Snell on 12/12/20 and was give a L GT steroid injection and was referred to [facility]. Pt was also advised to proceed to already scheduled L-spine MRI. Today, pt reports that he is feeling a little better   Majority of pain is now located in right lateral hip and bilateral low back.  He does not have much pain going to the right anterior thigh as he was previously.   Dx imaging: 12/18/20 L-spine MRI  10/23/16 L-spine XR  08/21/13 L-spine XR  08/11/13 L hip XR  Pertinent review of systems: No fevers or chills  Relevant historical information: Hypertension.  Obesity.   Exam:  BP 130/82 (BP Location: Left Arm, Patient Position: Sitting, Cuff Size: Normal)   Pulse 87   Wt 298 lb (135.2 kg)   SpO2 98%   BMI 37.25 kg/m  General: Well Developed, well nourished, and in no acute distress.   MSK: L-spine nontender midline.  Normal lumbar motion. Left hip tender palpation lateral hip greater trochanter.    Lab and Radiology Results  EXAM: MRI LUMBAR SPINE WITHOUT CONTRAST   TECHNIQUE: Multiplanar, multisequence MR imaging of the lumbar spine was performed. No intravenous contrast was administered.   COMPARISON:  X-ray 10/23/2016   FINDINGS: Segmentation:  Standard.   Alignment:  Trace retrolisthesis of L5 on S1.   Vertebrae:  No fracture, evidence of discitis, or bone lesion.   Conus medullaris and cauda equina: Conus extends to the L1 level. Conus and cauda equina appear normal.   Paraspinal and other soft tissues: Negative.   Disc levels:   T12-L1: Shallow left paracentral disc protrusion. Unremarkable facet joints. No foraminal or canal stenosis.   L1-L2: Shallow  central disc protrusion. Unremarkable facet joints. No foraminal or canal stenosis.   L2-L3: Unremarkable.   L3-L4: Unremarkable.   L4-L5: Mild circumferential disc bulge with small left foraminal/extraforaminal disc osteophyte complex. Mild facet hypertrophy. Slight impress upon the ventral thecal sac and mild bilateral subarticular recess narrowing. No canal stenosis. Mild left foraminal stenosis.   L5-S1: Disc height loss with mild diffuse disc osteophyte complex. Mild facet hypertrophy. Mild bilateral subarticular recess narrowing. No canal stenosis. There is mass effect upon the bilateral exited L5 nerve roots secondary to osteophytic endplate ridging. No stenosis within either neural foramina.   IMPRESSION: 1. Mild degenerative changes at L4-L5 and L5-S1 with mild bilateral subarticular recess narrowing at both levels. 2. Mild left foraminal stenosis at L4-L5. 3. No significant canal stenosis at any level.     Electronically Signed   By: Davina Poke D.O.   On: 12/18/2020 12:28  I, Lynne Leader, personally (independently) visualized and performed the interpretation of the images attached in this note.    Assessment and Plan: 50 y.o. male with multifactorial pain.  Left lateral hip pain due to greater trochanteric bursitis.  This has improved with injection and now just during physical therapy.  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  This is improving as well with physical therapy.  He does not seem to have much lumbar radiculopathy as  originally thought at the right L3 pattern.  Plan to continue PT and if not improved consider lumbar facet injection at L4-5 or L5-S1 however this seems to be a lesser issue.  Recheck as needed.  Discussed treatment plan and options.  Additionally reviewed MRI findings. Total encounter time 20 minutes including face-to-face  time with the patient and, reviewing past medical record, and charting on the date of service.      Discussed warning signs or symptoms. Please see discharge instructions. Patient expresses understanding.   The above documentation has been reviewed and is accurate and complete Lynne Leader, M.D.

## 2020-12-31 ENCOUNTER — Encounter: Payer: Self-pay | Admitting: Family Medicine

## 2020-12-31 ENCOUNTER — Other Ambulatory Visit: Payer: Self-pay

## 2020-12-31 ENCOUNTER — Ambulatory Visit: Payer: 59 | Admitting: Family Medicine

## 2020-12-31 VITALS — BP 130/82 | HR 87 | Wt 298.0 lb

## 2020-12-31 DIAGNOSIS — M7062 Trochanteric bursitis, left hip: Secondary | ICD-10-CM | POA: Diagnosis not present

## 2020-12-31 DIAGNOSIS — M5441 Lumbago with sciatica, right side: Secondary | ICD-10-CM

## 2020-12-31 NOTE — Patient Instructions (Addendum)
Thank you for coming in today.   Plan to continue PT.   Recheck as needed.   Let me know if you need anything.   The MRI of the spine look ok.

## 2021-01-21 ENCOUNTER — Encounter: Payer: Self-pay | Admitting: Gastroenterology

## 2021-02-28 NOTE — Progress Notes (Signed)
   I, Wendy Poet, LAT, ATC, am serving as scribe for Dr. Lynne Leader.  Robert Lloyd is a 50 y.o. male who presents to [place] at [facility] today for f/u of B LBP and L lateral hip pain due to hip abductor tendinopathy/trochanteric bursitis.  He was last seen by Dr. Georgina Snell on 12/31/20 and was advised to con't PT and his HEP.  He had a prior L GT steroid injection on 12/12/20.  Today, pt reports L hip is very painful, much worse then when he was seen last. Pt notes LBP has improved. Pt locates pain to the lateral aspect of his L hip. Pt recalls only slight benefit from prior steroid injection.  Pain is located at posterior lateral hip.  Diagnostic testing: L-spine MRI- 12/18/20; L-spine XR-10/23/16  Pertinent review of systems: No fevers or chills  Relevant historical information: Hypertension.   Exam:  BP 120/82   Pulse 88   Ht '6\' 3"'$  (1.905 m)   Wt (!) 302 lb 6.4 oz (137.2 kg)   SpO2 97%   BMI 37.80 kg/m  General: Well Developed, well nourished, and in no acute distress.   MSK: Left hip normal. Tender palpation posterior aspect of greater trochanter. Decreased hip motion.   Antalgic gait.    Lab and Radiology Results  X-ray images left hip obtained today personally and independently interpreted No acute fractures.  No severe degenerative changes. Await formal radiology review     Assessment and Plan: 50 y.o. male with left hip pain.  Pain thought to be related to hip abductor tendinopathy or bursitis.  He has failed typical conservative management strategies including physical therapy and unguided greater trochanter injection.  Treatment has been ongoing for this issue since June 30 with me.  At this point plan for MRI to further characterize cause of hip pain and for potential surgical or injection planning.  Recheck after MRI.  Patient agrees with plan.   PDMP not reviewed this encounter. Orders Placed This Encounter  Procedures   MR HIP LEFT WO  CONTRAST    Standing Status:   Future    Standing Expiration Date:   03/03/2022    Order Specific Question:   What is the patient's sedation requirement?    Answer:   No Sedation    Order Specific Question:   Does the patient have a pacemaker or implanted devices?    Answer:   No    Order Specific Question:   Preferred imaging location?    Answer:   [facility] (table limit-550lbs)   DG HIP UNILAT WITH PELVIS 2-3 VIEWS LEFT    Standing Status:   Future    Number of Occurrences:   1    Standing Expiration Date:   03/03/2022    Order Specific Question:   Reason for Exam (SYMPTOM  OR DIAGNOSIS REQUIRED)    Answer:   eval left hip pain. Suspect tendonitis    Order Specific Question:   Preferred imaging location?    Answer:   [facility]   No orders of the defined types were placed in this encounter.    Discussed warning signs or symptoms. Please see discharge instructions. Patient expresses understanding.   The above documentation has been reviewed and is accurate and complete Lynne Leader, M.D.

## 2021-03-03 ENCOUNTER — Other Ambulatory Visit: Payer: Self-pay

## 2021-03-03 ENCOUNTER — Ambulatory Visit (INDEPENDENT_AMBULATORY_CARE_PROVIDER_SITE_OTHER): Payer: 59

## 2021-03-03 ENCOUNTER — Ambulatory Visit: Payer: 59 | Admitting: Family Medicine

## 2021-03-03 VITALS — BP 120/82 | HR 88 | Ht 75.0 in | Wt 302.4 lb

## 2021-03-03 DIAGNOSIS — M7062 Trochanteric bursitis, left hip: Secondary | ICD-10-CM

## 2021-03-03 NOTE — Progress Notes (Signed)
Left hip x-ray shows some minimal spurring of the socket.  MRI should be helpful.

## 2021-03-03 NOTE — Patient Instructions (Signed)
Thank you for coming in today.   Please get an Xray today before you leave   You should hear from MRI scheduling within 1 week. If you do not hear please let me know.    After the MRI we will talk about what it means and what we are going to do about it.

## 2021-03-04 ENCOUNTER — Telehealth: Payer: Self-pay | Admitting: Family Medicine

## 2021-03-04 NOTE — Telephone Encounter (Signed)
Peer to Peer completed Auth. [id number] Exp Apr 18, 2021

## 2021-03-21 ENCOUNTER — Encounter: Payer: Self-pay | Admitting: Gastroenterology

## 2021-03-21 ENCOUNTER — Other Ambulatory Visit: Payer: Self-pay

## 2021-03-21 ENCOUNTER — Ambulatory Visit (AMBULATORY_SURGERY_CENTER): Payer: 59 | Admitting: *Deleted

## 2021-03-21 VITALS — Ht 75.0 in | Wt 302.0 lb

## 2021-03-21 DIAGNOSIS — Z1211 Encounter for screening for malignant neoplasm of colon: Secondary | ICD-10-CM

## 2021-03-21 MED ORDER — PLENVU 140 G PO SOLR: 1.0000 | Freq: Once | ORAL | 0 refills | Status: AC

## 2021-03-21 NOTE — Progress Notes (Signed)
Patient's pre-visit was done today over the phone with the patient due to COVID-19 pandemic. Name,DOB and address verified. Patient denies any allergies to Eggs and Soy. Patient denies any problems with anesthesia/sedation. Patient is not taking any diet pills or blood thinners. No home Oxygen. Packet of Prep instructions mailed to patient including a copy of a consent form & coupon-pt is aware. Patient understands to call us back with any questions or concerns. Patient is aware of our care-partner policy and [id number] safety protocol.   EMMI education assigned to the patient for the procedure, sent to [place].   The patient is COVID-19 vaccinated.

## 2021-03-25 ENCOUNTER — Ambulatory Visit
Admission: RE | Admit: 2021-03-25 | Discharge: 2021-03-25 | Disposition: A | Discharge status: Home / Self Care | Payer: 59 | Source: Ambulatory Visit | Attending: Family Medicine | Admitting: Family Medicine

## 2021-03-25 ENCOUNTER — Other Ambulatory Visit: Payer: Self-pay

## 2021-03-25 DIAGNOSIS — M7062 Trochanteric bursitis, left hip: Secondary | ICD-10-CM

## 2021-03-27 NOTE — Progress Notes (Signed)
Hip MRI shows a little bit of left-sided trochanteric bursitis.  This is probably the source of your pain.  Additionally you have some arthritis in the hip joint and some arthritis in the base of the spine that we can see on the MRI as well as an MRI lumbar spine from July.  Recommend return to clinic to go over the results and potentially proceed with repeat injection.

## 2021-03-31 ENCOUNTER — Ambulatory Visit: Payer: 59 | Admitting: Family Medicine

## 2021-03-31 ENCOUNTER — Encounter: Payer: Self-pay | Admitting: Family Medicine

## 2021-03-31 ENCOUNTER — Ambulatory Visit: Payer: Self-pay

## 2021-03-31 ENCOUNTER — Other Ambulatory Visit: Payer: Self-pay

## 2021-03-31 VITALS — BP 132/88 | HR 87 | Ht 75.0 in | Wt 304.7 lb

## 2021-03-31 DIAGNOSIS — M7062 Trochanteric bursitis, left hip: Secondary | ICD-10-CM

## 2021-03-31 NOTE — Progress Notes (Signed)
I, Peterson Lombard, LAT, ATC acting as a scribe for Lynne Leader, MD.  Robert Lloyd is a 50 y.o. male who presents to [place] at [facility] today for f/u L lateral hip pain due to hip abductor tendinopathy/trochanteric bursitis and MRI review. Pt was last seen by Dr. Georgina Snell on 03/03/21 and was advised to proceed to MRI. Today, pt reports L hip is about the same and not doing well. No radiating or numbness/tingling.  Dx imaging: 03/25/21 L hip MRI  03/03/21 L hip XR  12/18/20 L-spine MRI  Pertinent review of systems: No fevers or chills  Relevant historical information: Hypertension.   Exam:  BP 132/88   Pulse 87   Ht 6\' 3"  (1.905 m)   Wt (!) 304 lb 11.2 oz (138.2 kg)   SpO2 96%   BMI 38.08 kg/m  General: Well Developed, well nourished, and in no acute distress.   MSK: Left hip normal-appearing Tender palpation greater trochanter    Lab and Radiology Results  Procedure: Real-time Ultrasound Guided Injection of the hip greater trochanter bursa Device: Philips Affiniti 50G Images permanently stored and available for review in PACS Verbal informed consent obtained.  Discussed risks and benefits of procedure. Warned about infection bleeding damage to structures skin hypopigmentation and fat atrophy among others. Patient expresses understanding and agreement Time-out conducted.   Noted no overlying erythema, induration, or other signs of local infection.   Skin prepped in a sterile fashion.   Local anesthesia: Topical Ethyl chloride.   With sterile technique and under real time ultrasound guidance: 40 mg of Kenalog and 2 mL of lidocaine injected into bursa. Fluid seen entering the bursa.   Completed without difficulty   Pain immediately resolved suggesting accurate placement of the medication.   Advised to call if fevers/chills, erythema, induration, drainage, or persistent bleeding.   Images permanently stored and available for review in the ultrasound  unit.  Impression: Technically successful ultrasound guided injection.    EXAM: MR OF THE LEFT HIP WITHOUT CONTRAST   TECHNIQUE: Multiplanar, multisequence MR imaging was performed. No intravenous contrast was administered.   COMPARISON:  Radiographs 03/03/2021   FINDINGS: Bones: There is evidence of lower lumbar spondylosis and degenerative disc disease as better depicted on lumbar MRI from 12/18/2020.   No findings of femoral head avascular necrosis or significant marrow edema. Mild spurring of the femoral heads and acetabula bilaterally.   Articular cartilage and labrum   Articular cartilage: Mild axial degenerative chondral thinning in both hips without well-defined focal chondral defect.   Labrum:  Grossly unremarkable   Joint or bursal effusion   Joint effusion:  Absent   Bursae: Trace left trochanteric bursitis on image 12 series 10.   Muscles and tendons   Muscles and tendons:  Unremarkable   Other findings   Miscellaneous:   Unremarkable   IMPRESSION: 1. Trace left trochanteric bursitis. 2. Mild bilateral hip osteoarthritis associated with mild spurring and mild axial loss articular space in the hips. 3. Lower lumbar spondylosis and degenerative disc disease as depicted on recent lumbar spine MRI of 12/18/2020.     Electronically Signed   By: Van Clines M.D.   On: 03/27/2021 09:34   I, Lynne Leader, personally (independently) visualized and performed the interpretation of the images attached in this note.      Assessment and Plan: 50 y.o. male with left lateral hip pain thought to be due to greater trochanteric bursitis.  MRI does not show severe abnormality.  It does show some peritrochanteric bursitis as well as some mild hip arthritis.  Plan to try ultrasound-guided greater trochanter injection and continue home exercise program previously taught in physical therapy.  If not improved he will let me know and we will try an intra-articular  diagnostic and therapeutic injection.   PDMP not reviewed this encounter. Orders Placed This Encounter  Procedures   [place] LIMITED JOINT SPACE STRUCTURES LOW LEFT(NO LINKED CHARGES)    Standing Status:   Future    Number of Occurrences:   1    Standing Expiration Date:   09/29/2021    Order Specific Question:   Reason for Exam (SYMPTOM  OR DIAGNOSIS REQUIRED)    Answer:   left hip pain    Order Specific Question:   Preferred imaging location?    Answer:   [place]   No orders of the defined types were placed in this encounter.    Discussed warning signs or symptoms. Please see discharge instructions. Patient expresses understanding.   The above documentation has been reviewed and is accurate and complete Lynne Leader, M.D.

## 2021-03-31 NOTE — Patient Instructions (Addendum)
Thank you for coming in today.   You received an injection today. Seek immediate medical attention if the joint becomes red, extremely painful, or is oozing fluid.   Recheck back as needed.

## 2021-04-04 ENCOUNTER — Ambulatory Visit (AMBULATORY_SURGERY_CENTER): Payer: 59 | Admitting: Gastroenterology

## 2021-04-04 ENCOUNTER — Encounter: Payer: Self-pay | Admitting: Gastroenterology

## 2021-04-04 ENCOUNTER — Other Ambulatory Visit: Payer: Self-pay

## 2021-04-04 VITALS — BP 131/77 | HR 76 | Temp 99.3°F | Resp 16 | Ht 75.0 in | Wt 302.0 lb

## 2021-04-04 DIAGNOSIS — Z1211 Encounter for screening for malignant neoplasm of colon: Secondary | ICD-10-CM | POA: Diagnosis not present

## 2021-04-04 DIAGNOSIS — D122 Benign neoplasm of ascending colon: Secondary | ICD-10-CM

## 2021-04-04 DIAGNOSIS — D123 Benign neoplasm of transverse colon: Secondary | ICD-10-CM

## 2021-04-04 DIAGNOSIS — D120 Benign neoplasm of cecum: Secondary | ICD-10-CM

## 2021-04-04 MED ORDER — SODIUM CHLORIDE 0.9 % IV SOLN: 500.0000 mL | INTRAVENOUS | Status: DC

## 2021-04-04 NOTE — Progress Notes (Signed)
Called to room to assist during endoscopic procedure.  Patient ID and intended procedure confirmed with present staff. Received instructions for my participation in the procedure from the performing physician.  

## 2021-04-04 NOTE — Op Note (Signed)
[facility] Patient Name: Robert Lloyd Procedure Date: 04/04/2021 8:26 AM MRN: [id number] Endoscopist: Mauri Pole , MD Age: 50 Referring MD:  Date of Birth: 08-15-1970 Gender: Male Account #: [id number] Procedure:                Colonoscopy Indications:              Screening for colorectal malignant neoplasm Medicines:                Monitored Anesthesia Care Procedure:                Pre-Anesthesia Assessment:                           - Prior to the procedure, a History and Physical                            was performed, and patient medications and                            allergies were reviewed. The patient's tolerance of                            previous anesthesia was also reviewed. The risks                            and benefits of the procedure and the sedation                            options and risks were discussed with the patient.                            All questions were answered, and informed consent                            was obtained. Prior Anticoagulants: The patient has                            taken no previous anticoagulant or antiplatelet                            agents. ASA Grade Assessment: II - A patient with                            mild systemic disease. After reviewing the risks                            and benefits, the patient was deemed in                            satisfactory condition to undergo the procedure.                           After obtaining informed consent, the colonoscope  was passed under direct vision. Throughout the                            procedure, the patient's blood pressure, pulse, and                            oxygen saturations were monitored continuously. The                            PCF-HQ190L Colonoscope was introduced through the                            anus and advanced to the the cecum, identified by                            appendiceal  orifice and ileocecal valve. The                            colonoscopy was performed without difficulty. The                            patient tolerated the procedure well. The quality                            of the bowel preparation was good. The ileocecal                            valve, appendiceal orifice, and rectum were                            photographed. Scope In: 8:40:45 AM Scope Out: 8:57:30 AM Scope Withdrawal Time: 0 hours 12 minutes 32 seconds  Total Procedure Duration: 0 hours 16 minutes 45 seconds  Findings:                 The perianal and digital rectal examinations were                            normal.                           Three sessile polyps were found in the transverse                            colon, ascending colon and cecum. The polyps were 5                            to 8 mm in size. These polyps were removed with a                            cold snare. Resection and retrieval were complete.                           A 16 mm polyp was found in the ascending colon. The  polyp was sessile. The polyp was removed with a hot                            snare. Resection and retrieval were complete.                           A few small-mouthed diverticula were found in the                            sigmoid colon.                           Non-bleeding internal hemorrhoids were found during                            retroflexion. The hemorrhoids were small.                           The exam was otherwise without abnormality. Complications:            No immediate complications. Estimated Blood Loss:     Estimated blood loss was minimal. Impression:               - Three 5 to 8 mm polyps in the transverse colon,                            in the ascending colon and in the cecum, removed                            with a cold snare. Resected and retrieved.                           - One 16 mm polyp in the ascending colon,  removed                            with a hot snare. Resected and retrieved.                           - Diverticulosis in the sigmoid colon.                           - Non-bleeding internal hemorrhoids.                           - The examination was otherwise normal. Recommendation:           - Patient has a contact number available for                            emergencies. The signs and symptoms of potential                            delayed complications were discussed with the  patient. Return to normal activities tomorrow.                            Written discharge instructions were provided to the                            patient.                           - Resume previous diet.                           - Continue present medications.                           - Await pathology results.                           - Repeat colonoscopy in 3 years for surveillance                            based on pathology results. Mauri Pole, MD 04/04/2021 9:05:10 AM This report has been signed electronically.

## 2021-04-04 NOTE — Progress Notes (Signed)
[facility] History and Physical   Primary Care Physician:  Biagio Borg, MD   Reason for Procedure:  Colorectal cancer screening  Plan:    Screening colonoscopy with possible interventions as needed     HPI: Robert Lloyd is a very pleasant 50 y.o. male here for screening colonoscopy. Denies any nausea, vomiting, abdominal pain, melena or bright red blood per rectum  The risks and benefits as well as alternatives of endoscopic procedure(s) have been discussed and reviewed. All questions answered. The patient agrees to proceed.    Past Medical History:  Diagnosis Date   Anxiety state, unspecified 11/09/2012   DEPRESSION 03/31/2007   Qualifier: Diagnosis of  By: Jenny Reichmann MD, Hunt Oris    HYPERLIPIDEMIA 03/31/2007   Qualifier: Diagnosis of  By: Jenny Reichmann MD, Hunt Oris    HYPERTENSION 04/10/2008   Qualifier: Diagnosis of  By: Loanne Drilling MD, Sean A    Obesity     Past Surgical History:  Procedure Laterality Date   HERNIA REPAIR     infancy   LITHOTRIPSY     rotater cuff repair Right     Prior to Admission medications   Medication Sig Start Date End Date Taking? Authorizing Provider  Ascorbic Acid (VITAMIN C PO) Take by mouth.   Yes [provider]  Cholecalciferol (VITAMIN D3 PO) Take by mouth.   Yes [provider]  meloxicam (MOBIC) 15 MG tablet TAKE 1 TABLET BY MOUTH EVERY DAY 03/12/20  Yes Hulan Saas M, DO  QUERCETIN PO Take by mouth.   Yes [provider]  rosuvastatin (CRESTOR) 20 MG tablet Take 1 tablet (20 mg total) by mouth every other day. 12/04/20  Yes Biagio Borg, MD  zinc gluconate 50 MG tablet Take 50 mg by mouth daily.   Yes [provider]  zolpidem (AMBIEN CR) 12.5 MG CR tablet TAKE 1 TABLET (12.5 MG TOTAL) BY MOUTH AT BEDTIME AS NEEDED. FOR SLEEP 10/16/20  Yes Biagio Borg, MD  clonazePAM (KLONOPIN) 0.5 MG tablet TAKE 1 TABLET BY MOUTH TWICE A DAY AS NEEDED FOR ANXIETY 10/16/20   Biagio Borg, MD    Current  Outpatient Medications  Medication Sig Dispense Refill   Ascorbic Acid (VITAMIN C PO) Take by mouth.     Cholecalciferol (VITAMIN D3 PO) Take by mouth.     meloxicam (MOBIC) 15 MG tablet TAKE 1 TABLET BY MOUTH EVERY DAY 30 tablet 0   QUERCETIN PO Take by mouth.     rosuvastatin (CRESTOR) 20 MG tablet Take 1 tablet (20 mg total) by mouth every other day. 45 tablet 3   zinc gluconate 50 MG tablet Take 50 mg by mouth daily.     zolpidem (AMBIEN CR) 12.5 MG CR tablet TAKE 1 TABLET (12.5 MG TOTAL) BY MOUTH AT BEDTIME AS NEEDED. FOR SLEEP 90 tablet 0   clonazePAM (KLONOPIN) 0.5 MG tablet TAKE 1 TABLET BY MOUTH TWICE A DAY AS NEEDED FOR ANXIETY 60 tablet 2   Current Facility-Administered Medications  Medication Dose Route Frequency Provider Last Rate Last Admin   0.9 %  sodium chloride infusion  500 mL Intravenous Continuous Kassidi Elza, Venia Minks, MD        Allergies as of 04/04/2021 - Review Complete 04/04/2021  Allergen Reaction Noted   Sulfonamide derivatives      Family History  Problem Relation Age of Onset   Hypertension Mother    Colon polyps Brother    Colon cancer Neg Hx    Esophageal  cancer Neg Hx    Rectal cancer Neg Hx    Stomach cancer Neg Hx     Social History   Socioeconomic History   Marital status: Married    Spouse name: n/a   Number of children: 0   Years of education: 12+   Highest education level: Not on file  Occupational History   Occupation: Gaffer    Comment: [employer]  Tobacco Use   Smoking status: Never   Smokeless tobacco: Never  Vaping Use   Vaping Use: Never used  Substance and Sexual Activity   Alcohol use: Yes    Alcohol/week: 0.0 standard drinks    Comment: occasionally   Drug use: No   Sexual activity: Not on file  Other Topics Concern   Not on file  Social History Narrative   Lives alone. Twin brother lives next door with his wife and son.   Social Determinants of Health   Financial Resource Strain: Not on file   Food Insecurity: Not on file  Transportation Needs: Not on file  Physical Activity: Not on file  Stress: Not on file  Social Connections: Not on file  Intimate Partner Violence: Not on file    Review of Systems:  All other review of systems negative except as mentioned in the HPI.  Physical Exam: Vital signs in last 24 hours: BP 140/83   Pulse 78   Temp 99.3 F (37.4 C)   Ht 6\' 3"  (1.905 m)   Wt (!) 302 lb (137 kg)   SpO2 96%   BMI 37.75 kg/m     General:   Alert, NAD Lungs:  Clear .   Heart:  Regular rate and rhythm Abdomen:  Soft, nontender and nondistended. Neuro/Psych:  Alert and cooperative. Normal mood and affect. A and O x 3  Reviewed labs, radiology imaging, old records and pertinent past GI work up  Patient is appropriate for planned procedure(s) and anesthesia in an ambulatory setting   K. Denzil Magnuson , MD [phone]

## 2021-04-04 NOTE — Progress Notes (Signed)
Pt's states no medical or surgical changes since previsit or office visit. 

## 2021-04-04 NOTE — Patient Instructions (Signed)
YOU HAD AN ENDOSCOPIC PROCEDURE TODAY AT THE [facility]:   Refer to the procedure report that was given to you for any specific questions about what was found during the examination.  If the procedure report does not answer your questions, please call your gastroenterologist to clarify.  If you requested that your care partner not be given the details of your procedure findings, then the procedure report has been included in a sealed envelope for you to review at your convenience later.  YOU SHOULD EXPECT: Some feelings of bloating in the abdomen. Passage of more gas than usual.  Walking can help get rid of the air that was put into your GI tract during the procedure and reduce the bloating. If you had a lower endoscopy (such as a colonoscopy or flexible sigmoidoscopy) you may notice spotting of blood in your stool or on the toilet paper. If you underwent a bowel prep for your procedure, you may not have a normal bowel movement for a few days.  Please Note:  You might notice some irritation and congestion in your nose or some drainage.  This is from the oxygen used during your procedure.  There is no need for concern and it should clear up in a day or so.  SYMPTOMS TO REPORT IMMEDIATELY:   Following lower endoscopy (colonoscopy or flexible sigmoidoscopy):  Excessive amounts of blood in the stool  Significant tenderness or worsening of abdominal pains  Swelling of the abdomen that is new, acute  Fever of 100F or higher   Following upper endoscopy (EGD)  Vomiting of blood or coffee ground material  New chest pain or pain under the shoulder blades  Painful or persistently difficult swallowing  New shortness of breath  Fever of 100F or higher  Black, tarry-looking stools  For urgent or emergent issues, a gastroenterologist can be reached at any hour by calling [phone]. Do not use MyChart messaging for urgent concerns.    DIET:  We do recommend a small meal at first, but  then you may proceed to your regular diet.  Drink plenty of fluids but you should avoid alcoholic beverages for 24 hours.  ACTIVITY:  You should plan to take it easy for the rest of today and you should NOT DRIVE or use heavy machinery until tomorrow (because of the sedation medicines used during the test).    FOLLOW UP: Our staff will call the number listed on your records 48-72 hours following your procedure to check on you and address any questions or concerns that you may have regarding the information given to you following your procedure. If we do not reach you, we will leave a message.  We will attempt to reach you two times.  During this call, we will ask if you have developed any symptoms of COVID 19. If you develop any symptoms (ie: fever, flu-like symptoms, shortness of breath, cough etc.) before then, please call [phone].  If you test positive for Covid 19 in the 2 weeks post procedure, please call and report this information to us.    If any biopsies were taken you will be contacted by phone or by letter within the next 1-3 weeks.  Please call us at [phone] if you have not heard about the biopsies in 3 weeks.    SIGNATURES/CONFIDENTIALITY: You and/or your care partner have signed paperwork which will be entered into your electronic medical record.  These signatures attest to the fact that that the information above on   your After Visit Summary has been reviewed and is understood.  Full responsibility of the confidentiality of this discharge information lies with you and/or your care-partner. 

## 2021-04-04 NOTE — Progress Notes (Signed)
Report to PACU, RN, vss, BBS= Clear.  

## 2021-04-08 ENCOUNTER — Telehealth: Payer: Self-pay

## 2021-04-08 NOTE — Telephone Encounter (Signed)
Left message on answering machine. 

## 2021-04-22 ENCOUNTER — Encounter: Payer: Self-pay | Admitting: Gastroenterology

## 2021-05-29 ENCOUNTER — Telehealth: Payer: Self-pay | Admitting: Internal Medicine

## 2021-05-29 NOTE — Telephone Encounter (Signed)
Patient states his eye is swollen and red  Offered patient provider's next ov on 12-21  Patient asked for advise concerning his eye, informed patient I could not give clinical advise due to not being clinical, offered patient a call back from provider's nurse for clinical advice, patient then hung up the phone

## 2021-06-29 ENCOUNTER — Other Ambulatory Visit: Payer: Self-pay | Admitting: Internal Medicine

## 2021-09-12 NOTE — Progress Notes (Signed)
? ?  I, Wendy Poet, LAT, ATC, am serving as scribe for Dr. Lynne Leader. ? ?Robert Lloyd is a 51 y.o. male who presents to [place] at [facility] today for neck pain.  He was last seen by Dr. Georgina Snell on 03/31/21 for L hip pain. Today, pt reports neck pain x several weeks w/ no known MOI.  He locates his pain to his post neck near the CT junction. ? ?Radiating pain: no ?UE paresthesias: no but tingling noted in the lateral neck R and L ?Aggravating factors: cervical rotation; looking at the computer at work ?Treatments tried: meloxicam; heat ? ?Pt also c/o L medial elbow pain x 3-4 months worsening w/ L forearm flexion-based activities.  He is RHD. ? ? ?Pertinent review of systems: No fevers or chills ? ?Relevant historical information: Hypertension ? ? ?Exam:  ?BP 106/70 (BP Location: Right Arm, Patient Position: Sitting, Cuff Size: Large)   Pulse 83   Ht '6\' 3"'$  (1.905 m)   Wt (!) 301 lb 6.4 oz (136.7 kg)   SpO2 95%   BMI 37.67 kg/m?  ?General: Well Developed, well nourished, and in no acute distress.  ? ?MSK: C-spine: Normal. ?Nontender to midline. ?Mildly tender palpation left cervical paraspinal musculature.  The perispinal muscles are rigid to palpation. ?Slight decreased range of motion of lateral flexion otherwise range of motion is intact. ?Upper extremity strength is intact ? ? ?Left elbow normal-appearing ?Normal motion. ?Tender palpation at medial epicondyle. ?Pain with resisted wrist flexion located at medial epicondyle. ?Strength is intact. ? ? ? ?Lab and Radiology Results ?X-ray images C-spine obtained today personally and independently interpreted ?Multilevel degenerative changes.  DDD most prominent at C2-3 and C6-7.  No acute fractures. ?Await formal radiology review ? ? ? ? ?Assessment and Plan: ?51 y.o. male with neck pain thought to be due to muscle spasm and dysfunction.  Plan for physical therapy and discussed ergonomics at work.  He is already done some work already  for ergonomics. ? ?Left elbow pain due to medial epicondylitis.  Home exercise program reviewed in clinic today by myself.  Recommend Thera-Band flex bar.  Physical therapy should also help with this. ? ?Recheck in 6 weeks. ? ? ?PDMP not reviewed this encounter. ?Orders Placed This Encounter  ?Procedures  ? DG Cervical Spine 2 or 3 views  ?  Standing Status:   Future  ?  Number of Occurrences:   1  ?  Standing Expiration Date:   10/15/2021  ?  Order Specific Question:   Reason for Exam (SYMPTOM  OR DIAGNOSIS REQUIRED)  ?  Answer:   neck pain  ?  Order Specific Question:   Preferred imaging location?  ?  Answer:   [facility]  ? Ambulatory referral to Physical Therapy  ?  Referral Priority:   Routine  ?  Referral Type:   Physical Medicine  ?  Referral Reason:   Specialty Services Required  ?  Requested Specialty:   Physical Therapy  ?  Number of Visits Requested:   1  ? ?No orders of the defined types were placed in this encounter. ? ? ? ?Discussed warning signs or symptoms. Please see discharge instructions. Patient expresses understanding. ? ? ?The above documentation has been reviewed and is accurate and complete Lynne Leader, M.D. ? ? ?

## 2021-09-15 ENCOUNTER — Encounter: Payer: Self-pay | Admitting: Family Medicine

## 2021-09-15 ENCOUNTER — Ambulatory Visit: Payer: Self-pay

## 2021-09-15 ENCOUNTER — Ambulatory Visit: Payer: 59 | Admitting: Family Medicine

## 2021-09-15 ENCOUNTER — Ambulatory Visit (INDEPENDENT_AMBULATORY_CARE_PROVIDER_SITE_OTHER): Payer: 59

## 2021-09-15 VITALS — BP 106/70 | HR 83 | Ht 75.0 in | Wt 301.4 lb

## 2021-09-15 DIAGNOSIS — M25522 Pain in left elbow: Secondary | ICD-10-CM | POA: Diagnosis not present

## 2021-09-15 DIAGNOSIS — M542 Cervicalgia: Secondary | ICD-10-CM

## 2021-09-15 NOTE — Patient Instructions (Addendum)
Good to see you today. ? ?Please get an Xray today before you leave. ? ?Theraband flexbar.  ?Medial epicondylitis ? ?Follow-up: 6 weeks.  ?

## 2021-09-16 NOTE — Progress Notes (Signed)
Cervical spine x-ray shows some arthritis changes.  No fractures are visible.

## 2021-10-31 ENCOUNTER — Other Ambulatory Visit: Payer: Self-pay | Admitting: Internal Medicine

## 2021-12-08 ENCOUNTER — Encounter: Payer: 59 | Admitting: Internal Medicine

## 2021-12-12 ENCOUNTER — Ambulatory Visit (INDEPENDENT_AMBULATORY_CARE_PROVIDER_SITE_OTHER): Payer: 59 | Admitting: Internal Medicine

## 2021-12-12 ENCOUNTER — Encounter: Payer: Self-pay | Admitting: Internal Medicine

## 2021-12-12 ENCOUNTER — Other Ambulatory Visit: Payer: Self-pay | Admitting: Internal Medicine

## 2021-12-12 VITALS — BP 118/66 | HR 75 | Temp 97.7°F | Ht 75.0 in | Wt 283.0 lb

## 2021-12-12 DIAGNOSIS — R7302 Impaired glucose tolerance (oral): Secondary | ICD-10-CM

## 2021-12-12 DIAGNOSIS — E559 Vitamin D deficiency, unspecified: Secondary | ICD-10-CM

## 2021-12-12 DIAGNOSIS — F411 Generalized anxiety disorder: Secondary | ICD-10-CM

## 2021-12-12 DIAGNOSIS — I10 Essential (primary) hypertension: Secondary | ICD-10-CM | POA: Diagnosis not present

## 2021-12-12 DIAGNOSIS — Z125 Encounter for screening for malignant neoplasm of prostate: Secondary | ICD-10-CM

## 2021-12-12 DIAGNOSIS — Z0001 Encounter for general adult medical examination with abnormal findings: Secondary | ICD-10-CM

## 2021-12-12 DIAGNOSIS — R5383 Other fatigue: Secondary | ICD-10-CM | POA: Diagnosis not present

## 2021-12-12 DIAGNOSIS — E538 Deficiency of other specified B group vitamins: Secondary | ICD-10-CM

## 2021-12-12 DIAGNOSIS — E7800 Pure hypercholesterolemia, unspecified: Secondary | ICD-10-CM | POA: Diagnosis not present

## 2021-12-12 LAB — CBC WITH DIFFERENTIAL/PLATELET
Basophils Absolute: 0.1 10*3/uL (ref 0.0–0.1)
Basophils Relative: 0.9 % (ref 0.0–3.0)
Eosinophils Absolute: 0.1 10*3/uL (ref 0.0–0.7)
Eosinophils Relative: 1.5 % (ref 0.0–5.0)
HCT: 42.2 % (ref 39.0–52.0)
Hemoglobin: 14.2 g/dL (ref 13.0–17.0)
Lymphocytes Relative: 29.6 % (ref 12.0–46.0)
Lymphs Abs: 2.4 10*3/uL (ref 0.7–4.0)
MCHC: 33.6 g/dL (ref 30.0–36.0)
MCV: 87.8 fl (ref 78.0–100.0)
Monocytes Absolute: 0.8 10*3/uL (ref 0.1–1.0)
Monocytes Relative: 10.5 % (ref 3.0–12.0)
Neutro Abs: 4.6 10*3/uL (ref 1.4–7.7)
Neutrophils Relative %: 57.5 % (ref 43.0–77.0)
Platelets: 199 10*3/uL (ref 150.0–400.0)
RBC: 4.81 Mil/uL (ref 4.22–5.81)
RDW: 14.1 % (ref 11.5–15.5)
WBC: 8 10*3/uL (ref 4.0–10.5)

## 2021-12-12 LAB — LIPID PANEL
Cholesterol: 138 mg/dL (ref 0–200)
HDL: 33.6 mg/dL — ABNORMAL LOW (ref >39.00)
LDL Cholesterol: 77 mg/dL (ref 0–99)
NonHDL: 104.58
Total CHOL/HDL Ratio: 4
Triglycerides: 140 mg/dL (ref 0.0–149.0)
VLDL: 28 mg/dL (ref 0.0–40.0)

## 2021-12-12 LAB — BASIC METABOLIC PANEL
BUN: 17 mg/dL (ref 6–23)
CO2: 29 mEq/L (ref 19–32)
Calcium: 9.7 mg/dL (ref 8.4–10.5)
Chloride: 101 mEq/L (ref 96–112)
Creatinine, Ser: 0.9 mg/dL (ref 0.40–1.50)
GFR: 99.08 mL/min (ref >60.00)
Glucose, Bld: 97 mg/dL (ref 70–99)
Potassium: 3.9 mEq/L (ref 3.5–5.1)
Sodium: 138 mEq/L (ref 135–145)

## 2021-12-12 LAB — TSH: TSH: 1.25 u[IU]/mL (ref 0.35–5.50)

## 2021-12-12 LAB — URINALYSIS, ROUTINE W REFLEX MICROSCOPIC
Bilirubin Urine: NEGATIVE
Ketones, ur: NEGATIVE
Leukocytes,Ua: NEGATIVE
Nitrite: NEGATIVE
Specific Gravity, Urine: 1.02 (ref 1.000–1.030)
Total Protein, Urine: NEGATIVE
Urine Glucose: NEGATIVE
Urobilinogen, UA: 0.2 (ref 0.0–1.0)
pH: 5.5 (ref 5.0–8.0)

## 2021-12-12 LAB — HEPATIC FUNCTION PANEL
ALT: 27 U/L (ref 0–53)
AST: 24 U/L (ref 0–37)
Albumin: 4.5 g/dL (ref 3.5–5.2)
Alkaline Phosphatase: 70 U/L (ref 39–117)
Bilirubin, Direct: 0.1 mg/dL (ref 0.0–0.3)
Total Bilirubin: 0.5 mg/dL (ref 0.2–1.2)
Total Protein: 7.4 g/dL (ref 6.0–8.3)

## 2021-12-12 LAB — PSA: PSA: 0.21 ng/mL (ref 0.10–4.00)

## 2021-12-12 LAB — TESTOSTERONE: Testosterone: 204.86 ng/dL — ABNORMAL LOW (ref 300.00–890.00)

## 2021-12-12 LAB — HEMOGLOBIN A1C: Hgb A1c MFr Bld: 6 % (ref 4.6–6.5)

## 2021-12-12 LAB — VITAMIN B12: Vitamin B-12: 432 pg/mL (ref 211–911)

## 2021-12-12 LAB — VITAMIN D 25 HYDROXY (VIT D DEFICIENCY, FRACTURES): VITD: 54.78 ng/mL (ref 30.00–100.00)

## 2021-12-12 MED ORDER — ROSUVASTATIN CALCIUM 20 MG PO TABS: 20.0000 mg | ORAL_TABLET | ORAL | 3 refills | Status: DC

## 2021-12-12 MED ORDER — ZOLPIDEM TARTRATE ER 12.5 MG PO TBCR
12.5000 mg | EXTENDED_RELEASE_TABLET | Freq: Every evening | ORAL | 1 refills | Status: DC | PRN
Start: 2021-12-12 — End: 2022-06-16

## 2021-12-12 MED ORDER — TESTOSTERONE 50 MG/5GM (1%) TD GEL: 5.0000 g | Freq: Every day | TRANSDERMAL | 5 refills | Status: DC

## 2021-12-12 NOTE — Progress Notes (Unsigned)
Patient ID: Robert Lloyd, male   DOB: [birth year], 51 y.o.   MRN: [id number]         Chief Complaint:: wellness exam and fatigue, hyperglycemia, hld, htn, anxiety       HPI:  Robert Lloyd is a 51 y.o. 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  Has had ongoing back issue with sitting at computer every day at work, has known DDD and seeing sport med and has had PT, most recently left neck pain.  Has also had deep tissue massage and feels better with neck ROM and less HA.  No radicular symptoms. Taking cresotr 20 mg qod due to some leg pain.  Twin brother had OSA and about his same size.  Sleeps ok, has waked up 2-3 times per night but chronic for years.  Denies worsening depressive symptoms though always some depressed , but no suicidal ideation, has ongoing anxiety, but klonopin helps, though has had probably 2 panic attack in the past 6 mo.  Pt denies chest pain, increased sob or doe, wheezing, orthopnea, PND, increased LE swelling, palpitations, dizziness or syncope.   Pt denies polydipsia, polyuria, or new focal neuro s/s.      Wt Readings from Last 3 Encounters:  12/12/21 283 lb (128.4 kg)  09/15/21 (!) 301 lb 6.4 oz (136.7 kg)  04/04/21 (!) 302 lb (137 kg)   BP Readings from Last 3 Encounters:  12/12/21 118/66  09/15/21 106/70  04/04/21 131/77   Immunization History  Administered Date(s) Administered   Influenza,inj,Quad PF,6+ Mos 03/09/2014, 05/24/2015, 03/17/2016, 03/15/2018, 03/06/2019   Moderna Sars-Covid-2 Vaccination 08/17/2019, 09/11/2019, 05/03/2020   Tdap 03/09/2014   Zoster Recombinat (Shingrix) 04/15/2021, 06/18/2021  There are no preventive care reminders to display for  this patient.    Past Medical History:  Diagnosis Date   Anxiety state, unspecified 11/09/2012   DEPRESSION 03/31/2007   Qualifier: Diagnosis of  By: Jenny Reichmann MD, Hunt Oris    HYPERLIPIDEMIA 03/31/2007   Qualifier: Diagnosis of  By: Jenny Reichmann MD, Hunt Oris    HYPERTENSION 04/10/2008   Qualifier: Diagnosis of  By: Loanne Drilling MD, Sean A    Obesity    Past Surgical History:  Procedure Laterality Date   HERNIA REPAIR     infancy   LITHOTRIPSY     rotater cuff repair Right     reports that he has never smoked. He has never used smokeless tobacco. He reports current alcohol use. He reports that he does not use drugs. family history includes Colon polyps in his brother; Hypertension in his mother. Allergies  Allergen Reactions   Sulfonamide Derivatives     UNKNOWN reaction   Current Outpatient Medications on File Prior to Visit  Medication Sig Dispense Refill   Ascorbic Acid (VITAMIN C PO) Take by mouth.     Cholecalciferol (VITAMIN D3 PO) Take by mouth.     clonazePAM (KLONOPIN) 0.5 MG tablet TAKE 1 TABLET BY MOUTH TWICE A DAY AS NEEDED FOR ANXIETY 60 tablet 1   meloxicam (MOBIC) 15 MG tablet TAKE 1 TABLET  BY MOUTH EVERY DAY 30 tablet 0   QUERCETIN PO Take by mouth.     zinc gluconate 50 MG tablet Take 50 mg by mouth daily.     No current facility-administered medications on file prior to visit.        ROS:  All others reviewed and negative.  Objective        PE:  BP 118/66 (BP Location: Right Arm, Patient Position: Sitting, Cuff Size: Large)   Pulse 75   Temp 97.7 F (36.5 C) (Oral)   Ht '6\' 3"'$  (1.905 m)   Wt 283 lb (128.4 kg)   SpO2 97%   BMI 35.37 kg/m                 Constitutional: Pt appears in NAD               HENT: Head: NCAT.                Right Ear: External ear normal.                 Left Ear: External ear normal.                Eyes: . Pupils are equal, round, and reactive to light. Conjunctivae and EOM are normal               Nose: without d/c or deformity                Neck: Neck supple. Gross normal ROM               Cardiovascular: Normal rate and regular rhythm.                 Pulmonary/Chest: Effort normal and breath sounds without rales or wheezing.                Abd:  Soft, NT, ND, + BS, no organomegaly               Neurological: Pt is alert. At baseline orientation, motor grossly intact               Skin: Skin is warm. No rashes, no other new lesions, LE edema - none               Psychiatric: Pt behavior is normal without agitation   Micro: none  Cardiac tracings I have personally interpreted today:  none  Pertinent Radiological findings (summarize): none   Lab Results  Component Value Date   WBC 8.0 12/12/2021   HGB 14.2 12/12/2021   HCT 42.2 12/12/2021   PLT 199.0 12/12/2021   GLUCOSE 97 12/12/2021   CHOL 138 12/12/2021   TRIG 140.0 12/12/2021   HDL 33.60 (L) 12/12/2021   LDLDIRECT 160.0 03/15/2018   LDLCALC 77 12/12/2021   ALT 27 12/12/2021   AST 24 12/12/2021   NA 138 12/12/2021   K 3.9 12/12/2021   CL 101 12/12/2021   CREATININE 0.90 12/12/2021   BUN 17 12/12/2021   CO2 29 12/12/2021   TSH 1.25 12/12/2021   PSA 0.21 12/12/2021   HGBA1C 6.0 12/12/2021   Assessment/Plan:  Robert Lloyd is a 51 y.o. White or Caucasian [1] male with  has a past medical history of Anxiety state, unspecified (11/09/2012), DEPRESSION (03/31/2007), HYPERLIPIDEMIA (03/31/2007), HYPERTENSION (04/10/2008), and Obesity.  Encounter for well adult exam with abnormal findings Age and sex appropriate education and counseling updated with regular exercise and diet Referrals for  preventative services - none needed Immunizations addressed - none needed Smoking counseling  - none needed Evidence for depression or other mood disorder - chronic anxiety and panic overall stable Most recent labs reviewed. I have personally reviewed and have noted: 1) the patient's medical and social history 2) The patient's current medications and supplements 3)  The patient's height, weight, and BMI have been recorded in the chart   Impaired glucose tolerance Lab Results  Component Value Date   HGBA1C 6.0 12/12/2021   Stable, pt to continue current medical treatment  - diet, wt control, excercise   Hyperlipidemia Lab Results  Component Value Date   LDLCALC 77 12/12/2021   Stable, pt to continue current statin crestor 20 mg qd   Fatigue Etiology unclear, I suspect possible OSA but he declines pulm referral, for testosterone level with labs for now  Essential hypertension BP Readings from Last 3 Encounters:  12/12/21 118/66  09/15/21 106/70  04/04/21 131/77   Stable, pt to continue medical treatment  - improved with wt loss, low salt, excercise   Anxiety state Overall stable, continue klonopin prn,  to f/u any worsening symptoms or concerns  Followup: Return in about 1 year (around 12/13/2022).  [name], MD 12/14/2021 6:38 PM [facility]

## 2021-12-12 NOTE — Patient Instructions (Signed)

## 2021-12-14 ENCOUNTER — Encounter: Payer: Self-pay | Admitting: Internal Medicine

## 2021-12-14 NOTE — Assessment & Plan Note (Signed)
Lab Results  Component Value Date   LDLCALC 77 12/12/2021   Stable, pt to continue current statin crestor 20 mg qd

## 2021-12-14 NOTE — Assessment & Plan Note (Signed)
Overall stable, continue klonopin prn,  to f/u any worsening symptoms or concerns

## 2021-12-14 NOTE — Assessment & Plan Note (Signed)
Etiology unclear, I suspect possible OSA but he declines pulm referral, for testosterone level with labs for now

## 2021-12-14 NOTE — Assessment & Plan Note (Signed)
Age and sex appropriate education and counseling updated with regular exercise and diet Referrals for preventative services - none needed Immunizations addressed - none needed Smoking counseling  - none needed Evidence for depression or other mood disorder - chronic anxiety and panic overall stable Most recent labs reviewed. I have personally reviewed and have noted: 1) the patient's medical and social history 2) The patient's current medications and supplements 3) The patient's height, weight, and BMI have been recorded in the chart

## 2021-12-14 NOTE — Assessment & Plan Note (Signed)
Lab Results  Component Value Date   HGBA1C 6.0 12/12/2021   Stable, pt to continue current medical treatment  - diet, wt control, excercise

## 2021-12-14 NOTE — Assessment & Plan Note (Signed)
BP Readings from Last 3 Encounters:  12/12/21 118/66  09/15/21 106/70  04/04/21 131/77   Stable, pt to continue medical treatment  - improved with wt loss, low salt, excercise

## 2021-12-17 ENCOUNTER — Encounter: Payer: Self-pay | Admitting: Internal Medicine

## 2021-12-18 ENCOUNTER — Telehealth: Payer: Self-pay

## 2021-12-18 MED ORDER — TESTOSTERONE CYPIONATE 200 MG/ML IM SOLN: 100.0000 mg | INTRAMUSCULAR | 1 refills | Status: DC

## 2022-02-11 NOTE — Telephone Encounter (Signed)
Error

## 2022-02-19 ENCOUNTER — Ambulatory Visit (INDEPENDENT_AMBULATORY_CARE_PROVIDER_SITE_OTHER): Payer: 59

## 2022-02-19 ENCOUNTER — Ambulatory Visit: Payer: 59 | Admitting: Family Medicine

## 2022-02-19 VITALS — BP 142/88 | HR 73 | Ht 75.0 in | Wt 295.2 lb

## 2022-02-19 DIAGNOSIS — M546 Pain in thoracic spine: Secondary | ICD-10-CM | POA: Diagnosis not present

## 2022-02-19 DIAGNOSIS — R0782 Intercostal pain: Secondary | ICD-10-CM | POA: Diagnosis not present

## 2022-02-19 NOTE — Patient Instructions (Addendum)
Thank you for coming in today.   Please get an Xray today before you leave   Please get labs today before you leave   Let's see what these tests say and then plan for the next steps

## 2022-02-19 NOTE — Progress Notes (Signed)
I, Peterson Lombard, LAT, ATC acting as a scribe for Lynne Leader, MD.  Robert Lloyd is a 51 y.o. male who presents to [place] at [facility] today for neck and upper back pain. Pt was previously seen by Dr. Georgina Snell on 09/15/21 for neck pain and work ergonomics were discussed and pt was referred to [employer] [facility]. Today, pt reports the PT was doing some type of spine manipulation that worsened his pain and caused a pain in his sternum. Pt locates pain to along the medial border of both scapula and both sides of his neck  and intermittent sternal/chest pain. Pt also has a spasm in the mornings and tightness in the upper low back.   He notes more significant fatigue and exertional shortness of breath at times.  He has a follow-up appointment scheduled with his primary care provider tomorrow to follow-up these issues more specifically.  His symptoms have been ongoing for months.  Radiating pain: no LE numbness/tingling: no LE weakness: no Aggravates: mornings, any activity Treatments tried: prior PT, HEP, IBU, Meloxicam  Dx imaging: 09/15/21 C-spine XR  12/18/20 L-spine MRI  Pertinent review of systems: No fevers or chills.  Positive for chest pain shortness of breath as above.  Relevant historical information: Hypertension.  History of a pulmonary nodule.   Exam:  BP (!) 142/88   Pulse 73   Ht '6\' 3"'$  (1.905 m)   Wt 295 lb 3.2 oz (133.9 kg)   SpO2 98%   BMI 36.90 kg/m  General: Well Developed, well nourished, and in no acute distress.   MSK: T-spine: Tender palpation spinal midline mid thoracic spine.  Tender palpation paraspinal musculature. Decreased thoracic motion.     Lab and Radiology Results  2 view chest x-ray images obtained today personally and independently interpreted No obvious vertebral compression fracture visible.  No acute infiltrate present. Await formal radiology review    Assessment and Plan: 51 y.o. male with worsening thoracic back pain  and sternum pain.  This is a continuation of an acute exacerbation of his chronic back pain.  He had an incident in physical therapy where he was having some manipulation of his thoracic spine with pressure which resulted in a pop and then more back pain and sternum pain.  This could be thoracic compression fracture or a sternum fracture at most but is more likely to be just more exacerbation of his underlying pain.  I think it is likely that he will benefit from a CT scan to further evaluate the potential for a subtle compression fracture of his thoracic spine or even a sternum injury or rib injury.  However with his fatigue and exertional symptoms occur chronic pulmonary embolism is a possibility. Plan for D-dimer.  If his D-dimer is positive I will obtain a CT angiogram of his chest.  This would help with the bony evaluation and I am pursuing as well as ruling out PE. If D-dimer is negative we will proceed with noncontrast CT scan.  He should follow-up with his primary care provider as scheduled tomorrow for further chest pain evaluation work-up.  PDMP not reviewed this encounter. Orders Placed This Encounter  Procedures   DG Chest 2 View    Standing Status:   Future    Number of Occurrences:   1    Standing Expiration Date:   03/21/2022    Order Specific Question:   Reason for Exam (SYMPTOM  OR DIAGNOSIS REQUIRED)    Answer:   chest  pain    Order Specific Question:   Preferred imaging location?    Answer:   [facility]   D-Dimer, Quantitative   No orders of the defined types were placed in this encounter.    Discussed warning signs or symptoms. Please see discharge instructions. Patient expresses understanding.   The above documentation has been reviewed and is accurate and complete Lynne Leader, M.D.

## 2022-02-20 ENCOUNTER — Ambulatory Visit: Payer: 59 | Admitting: Internal Medicine

## 2022-02-20 ENCOUNTER — Encounter: Payer: Self-pay | Admitting: Internal Medicine

## 2022-02-20 VITALS — BP 136/88 | HR 88 | Temp 98.2°F | Ht 75.0 in | Wt 293.0 lb

## 2022-02-20 DIAGNOSIS — R5383 Other fatigue: Secondary | ICD-10-CM | POA: Diagnosis not present

## 2022-02-20 DIAGNOSIS — Z23 Encounter for immunization: Secondary | ICD-10-CM

## 2022-02-20 DIAGNOSIS — R7303 Prediabetes: Secondary | ICD-10-CM | POA: Diagnosis not present

## 2022-02-20 DIAGNOSIS — F411 Generalized anxiety disorder: Secondary | ICD-10-CM | POA: Diagnosis not present

## 2022-02-20 DIAGNOSIS — E291 Testicular hypofunction: Secondary | ICD-10-CM | POA: Diagnosis not present

## 2022-02-20 DIAGNOSIS — R079 Chest pain, unspecified: Secondary | ICD-10-CM

## 2022-02-20 DIAGNOSIS — R0600 Dyspnea, unspecified: Secondary | ICD-10-CM

## 2022-02-20 LAB — CBC WITH DIFFERENTIAL/PLATELET
Basophils Absolute: 0.1 10*3/uL (ref 0.0–0.1)
Basophils Relative: 0.7 % (ref 0.0–3.0)
Eosinophils Absolute: 0.1 10*3/uL (ref 0.0–0.7)
Eosinophils Relative: 1.7 % (ref 0.0–5.0)
HCT: 48.2 % (ref 39.0–52.0)
Hemoglobin: 16 g/dL (ref 13.0–17.0)
Lymphocytes Relative: 27.3 % (ref 12.0–46.0)
Lymphs Abs: 1.9 10*3/uL (ref 0.7–4.0)
MCHC: 33.1 g/dL (ref 30.0–36.0)
MCV: 88.3 fl (ref 78.0–100.0)
Monocytes Absolute: 0.7 10*3/uL (ref 0.1–1.0)
Monocytes Relative: 10.5 % (ref 3.0–12.0)
Neutro Abs: 4.3 10*3/uL (ref 1.4–7.7)
Neutrophils Relative %: 59.8 % (ref 43.0–77.0)
Platelets: 208 10*3/uL (ref 150.0–400.0)
RBC: 5.46 Mil/uL (ref 4.22–5.81)
RDW: 14.5 % (ref 11.5–15.5)
WBC: 7.1 10*3/uL (ref 4.0–10.5)

## 2022-02-20 LAB — HEPATIC FUNCTION PANEL
ALT: 26 U/L (ref 0–53)
AST: 19 U/L (ref 0–37)
Albumin: 4.1 g/dL (ref 3.5–5.2)
Alkaline Phosphatase: 61 U/L (ref 39–117)
Bilirubin, Direct: 0.1 mg/dL (ref 0.0–0.3)
Total Bilirubin: 0.3 mg/dL (ref 0.2–1.2)
Total Protein: 7.7 g/dL (ref 6.0–8.3)

## 2022-02-20 LAB — LIPASE: Lipase: 19 U/L (ref 11.0–59.0)

## 2022-02-20 LAB — URINALYSIS, ROUTINE W REFLEX MICROSCOPIC
Bilirubin Urine: NEGATIVE
Ketones, ur: NEGATIVE
Leukocytes,Ua: NEGATIVE
Nitrite: NEGATIVE
Specific Gravity, Urine: >=1.03 — AB (ref 1.000–1.030)
Total Protein, Urine: NEGATIVE
Urine Glucose: NEGATIVE
Urobilinogen, UA: 0.2 (ref 0.0–1.0)
WBC, UA: NONE SEEN (ref 0–?)
pH: 5.5 (ref 5.0–8.0)

## 2022-02-20 LAB — TESTOSTERONE: Testosterone: 478.12 ng/dL (ref 300.00–890.00)

## 2022-02-20 LAB — BASIC METABOLIC PANEL
BUN: 14 mg/dL (ref 6–23)
CO2: 25 mEq/L (ref 19–32)
Calcium: 9.4 mg/dL (ref 8.4–10.5)
Chloride: 105 mEq/L (ref 96–112)
Creatinine, Ser: 0.78 mg/dL (ref 0.40–1.50)
GFR: 103.32 mL/min (ref >60.00)
Glucose, Bld: 108 mg/dL — ABNORMAL HIGH (ref 70–99)
Potassium: 4.3 mEq/L (ref 3.5–5.1)
Sodium: 138 mEq/L (ref 135–145)

## 2022-02-20 LAB — BRAIN NATRIURETIC PEPTIDE: Pro B Natriuretic peptide (BNP): 23 pg/mL (ref 0.0–100.0)

## 2022-02-20 LAB — D-DIMER, QUANTITATIVE: D-Dimer, Quant: 0.26 mcg/mL FEU (ref <0.50)

## 2022-02-20 LAB — TSH: TSH: 1.85 u[IU]/mL (ref 0.35–5.50)

## 2022-02-20 MED ORDER — METFORMIN HCL ER 500 MG PO TB24
500.0000 mg | ORAL_TABLET | Freq: Every day | ORAL | 0 refills | Status: DC
Start: 2022-02-20 — End: 2022-03-16

## 2022-02-20 MED ORDER — TIRZEPATIDE 2.5 MG/0.5ML ~~LOC~~ SOAJ: 2.5000 mg | SUBCUTANEOUS | 11 refills | Status: DC

## 2022-02-20 MED ORDER — CLONAZEPAM 0.5 MG PO TABS: ORAL_TABLET | ORAL | 1 refills | Status: DC

## 2022-02-20 NOTE — Patient Instructions (Signed)
Your EKG was done today  Please take all new medication as prescribed - the metformin (just fill the script for now), and mounjaro 2.5 mg weekly  Your D Dimer was normal  The CXR result is pending  Please continue all other medications as before, and refills have been done if requested.  Please have the pharmacy call with any other refills you may need.  Please continue your efforts at being more active, low cholesterol diet, and weight control.  You are otherwise up to date with prevention measures today.  Please keep your appointments with your specialists as you may have planned  You will be contacted regarding the referral for: Stress testing  Please go to the LAB at the blood drawing area for the tests to be done  You will be contacted by phone if any changes need to be made immediately.  Otherwise, you will receive a letter about your results with an explanation, but please check with MyChart first.  Please remember to sign up for MyChart if you have not done so, as this will be important to you in the future with finding out test results, communicating by private email, and scheduling acute appointments online when needed.  Please make an Appointment to return in 4 months, or sooner if needed

## 2022-02-20 NOTE — Progress Notes (Unsigned)
Patient ID: Robert Lloyd, male   DOB: [birth year], 51 y.o.   MRN: [id number]        Chief Complaint: follow up        HPI:  Robert Lloyd is a 51 y.o. male here with c/o low stamina, can only do lower level exercises at the gym, then wiped out when get home.  Gained 10lbs since last seen, wife seems to be enablng him with extra calories.  Also with fleeting mild intermittent chest and back pains, recurrent dizziness, lips tingling and panic at times better with klonopin. Has occasional voice weakness and hoarseness  Does c/o ongoing fatigue, but and has signficant daytime hypersomnolence but not interested in sleep apnea eval.    pt is interested in wt loss as part of tx for DM.  Did see Dr Georgina Snell yesterday, mentioned CP and sob, had cxr and test for blood clot per pt - cxr results pending, d dimer normal at 0.26.   Wt Readings from Last 3 Encounters:  02/20/22 293 lb (132.9 kg)  02/19/22 295 lb 3.2 oz (133.9 kg)  12/12/21 283 lb (128.4 kg)   BP Readings from Last 3 Encounters:  02/20/22 136/88  02/19/22 (!) 142/88  12/12/21 118/66         Past Medical History:  Diagnosis Date   Anxiety state, unspecified 11/09/2012   DEPRESSION 03/31/2007   Qualifier: Diagnosis of  By: Jenny Reichmann MD, Hunt Oris    HYPERLIPIDEMIA 03/31/2007   Qualifier: Diagnosis of  By: Jenny Reichmann MD, Hunt Oris    HYPERTENSION 04/10/2008   Qualifier: Diagnosis of  By: Loanne Drilling MD, Sean A    Obesity    Past Surgical History:  Procedure Laterality Date   HERNIA REPAIR     infancy   LITHOTRIPSY     rotater cuff repair Right     reports that he has never smoked. He has never used smokeless tobacco. He reports current alcohol use. He reports that he does not use drugs. family history includes Colon polyps in his brother; Hypertension in his mother. Allergies  Allergen Reactions   Sulfonamide Derivatives     UNKNOWN reaction   Current Outpatient Medications on File Prior to Visit  Medication Sig Dispense Refill   Ascorbic  Acid (VITAMIN C PO) Take by mouth.     Cholecalciferol (VITAMIN D3 PO) Take by mouth.     QUERCETIN PO Take by mouth.     rosuvastatin (CRESTOR) 20 MG tablet Take 1 tablet (20 mg total) by mouth every other day. 45 tablet 3   testosterone cypionate (DEPOTESTOSTERONE CYPIONATE) 200 MG/ML injection Inject 0.5 mLs (100 mg total) into the muscle every 14 (fourteen) days. 10 mL 1   zinc gluconate 50 MG tablet Take 50 mg by mouth daily.     zolpidem (AMBIEN CR) 12.5 MG CR tablet Take 1 tablet (12.5 mg total) by mouth at bedtime as needed. for sleep 90 tablet 1   No current facility-administered medications on file prior to visit.        ROS:  All others reviewed and negative.  Objective        PE:  BP 136/88 (BP Location: Right Arm, Patient Position: Sitting, Cuff Size: Large)   Pulse 88   Temp 98.2 F (36.8 C) (Oral)   Ht '6\' 3"'$  (1.905 m)   Wt 293 lb (132.9 kg)   SpO2 95%   BMI 36.62 kg/m  Constitutional: Pt appears in NAD, morbid obese               HENT: Head: NCAT.                Right Ear: External ear normal.                 Left Ear: External ear normal.                Eyes: . Pupils are equal, round, and reactive to light. Conjunctivae and EOM are normal               Nose: without d/c or deformity               Neck: Neck supple. Gross normal ROM               Cardiovascular: Normal rate and regular rhythm.                 Pulmonary/Chest: Effort normal and breath sounds without rales or wheezing.                Abd:  Soft, NT, ND, + BS, no organomegaly               Neurological: Pt is alert. At baseline orientation, motor grossly intact               Skin: Skin is warm. No rashes, no other new lesions, LE edema - none               Psychiatric: Pt behavior is normal without agitation  but 1-2+ nervous  Micro: none  Cardiac tracings I have personally interpreted today:  ECG - NSR 84  Pertinent Radiological findings (summarize): none   Lab Results   Component Value Date   WBC 7.1 02/20/2022   HGB 16.0 02/20/2022   HCT 48.2 02/20/2022   PLT 208.0 02/20/2022   GLUCOSE 108 (H) 02/20/2022   CHOL 138 12/12/2021   TRIG 140.0 12/12/2021   HDL 33.60 (L) 12/12/2021   LDLDIRECT 160.0 03/15/2018   LDLCALC 77 12/12/2021   ALT 26 02/20/2022   AST 19 02/20/2022   NA 138 02/20/2022   K 4.3 02/20/2022   CL 105 02/20/2022   CREATININE 0.78 02/20/2022   BUN 14 02/20/2022   CO2 25 02/20/2022   TSH 1.85 02/20/2022   PSA 0.21 12/12/2021   HGBA1C 6.0 12/12/2021   Assessment/Plan:  Robert Lloyd is a 51 y.o. White or Caucasian [1] male with  has a past medical history of Anxiety state, unspecified (11/09/2012), DEPRESSION (03/31/2007), HYPERLIPIDEMIA (03/31/2007), HYPERTENSION (04/10/2008), and Obesity.  Anxiety state Moderate to severe uncontrolled, declines ssri for now, for klonopin prn  Fatigue I suspect possible osa - declines for now; ecg reviewed, also for labs as ordered including cbc check testosterone  Prediabetes Lab Results  Component Value Date   HGBA1C 6.0 12/12/2021   Mild uncontrolled in morbid obese - for metformin ER 500 qd, mounjaro start asd   Chest pain Etiology unclear, ecg reviewed, will f/u cxr, also for stress test  Followup: Return in about 4 months (around 06/22/2022).  [name], MD 02/23/2022 8:57 PM [facility]

## 2022-02-23 ENCOUNTER — Telehealth: Payer: Self-pay | Admitting: Family Medicine

## 2022-02-23 ENCOUNTER — Encounter: Payer: Self-pay | Admitting: Internal Medicine

## 2022-02-23 ENCOUNTER — Other Ambulatory Visit: Payer: Self-pay | Admitting: Internal Medicine

## 2022-02-23 DIAGNOSIS — R079 Chest pain, unspecified: Secondary | ICD-10-CM

## 2022-02-23 DIAGNOSIS — R0782 Intercostal pain: Secondary | ICD-10-CM

## 2022-02-23 DIAGNOSIS — M546 Pain in thoracic spine: Secondary | ICD-10-CM

## 2022-02-23 NOTE — Progress Notes (Signed)
Chest x-ray looks normal to radiology.  Next step would be a CT scan of the chest.  I will order that test now.

## 2022-02-23 NOTE — Telephone Encounter (Signed)
Chest CT scan ordered

## 2022-02-23 NOTE — Assessment & Plan Note (Signed)
Lab Results  Component Value Date   HGBA1C 6.0 12/12/2021   Mild uncontrolled in morbid obese - for metformin ER 500 qd, mounjaro start asd

## 2022-02-23 NOTE — Assessment & Plan Note (Signed)
Etiology unclear, ecg reviewed, will f/u cxr, also for stress test

## 2022-02-23 NOTE — Assessment & Plan Note (Addendum)
I suspect possible osa - declines for now; ecg reviewed, also for labs as ordered including cbc check testosterone

## 2022-02-23 NOTE — Telephone Encounter (Signed)
Completed peer to peer for CT scan of the chest without contrast.  Authorization number is [id number] Expiration date is October 26.

## 2022-02-23 NOTE — Progress Notes (Signed)
D-dimer is negative.  Blood clot is extremely unlikely.

## 2022-02-23 NOTE — Assessment & Plan Note (Signed)
Moderate to severe uncontrolled, declines ssri for now, for klonopin prn

## 2022-02-26 NOTE — Telephone Encounter (Signed)
Ok so we will f/u on the cortisol level as well   thanks

## 2022-03-02 ENCOUNTER — Ambulatory Visit
Admission: RE | Admit: 2022-03-02 | Discharge: 2022-03-02 | Disposition: A | Discharge status: Home / Self Care | Payer: 59 | Source: Ambulatory Visit | Attending: Family Medicine | Admitting: Family Medicine

## 2022-03-02 DIAGNOSIS — M546 Pain in thoracic spine: Secondary | ICD-10-CM

## 2022-03-02 DIAGNOSIS — R0782 Intercostal pain: Secondary | ICD-10-CM

## 2022-03-03 NOTE — Progress Notes (Signed)
CT scan of the chest does not show rib fracture.  Does show some back arthritis of the thoracic spine.  Let me know how your work-up goes with Dr. Percival Spanish and cardiology.  If that is negative recommend return to clinic to proceed with further evaluation with me.

## 2022-03-04 ENCOUNTER — Telehealth (HOSPITAL_COMMUNITY): Payer: Self-pay | Admitting: *Deleted

## 2022-03-04 NOTE — Telephone Encounter (Signed)
Close encounter 

## 2022-03-05 ENCOUNTER — Ambulatory Visit (HOSPITAL_COMMUNITY)
Admission: RE | Admit: 2022-03-05 | Discharge: 2022-03-05 | Disposition: A | Discharge status: Home / Self Care | Payer: 59 | Source: Ambulatory Visit | Attending: Cardiology | Admitting: Cardiology

## 2022-03-05 DIAGNOSIS — R079 Chest pain, unspecified: Secondary | ICD-10-CM | POA: Insufficient documentation

## 2022-03-05 MED ORDER — TECHNETIUM TC 99M TETROFOSMIN IV KIT
29.0000 | PACK | Freq: Once | INTRAVENOUS | Status: AC | PRN
  Administered 2022-03-05: 29 via INTRAVENOUS

## 2022-03-06 ENCOUNTER — Ambulatory Visit (HOSPITAL_COMMUNITY)
Admission: RE | Admit: 2022-03-06 | Discharge: 2022-03-06 | Disposition: A | Discharge status: Home / Self Care | Payer: 59 | Source: Ambulatory Visit | Attending: Cardiology | Admitting: Cardiology

## 2022-03-06 ENCOUNTER — Other Ambulatory Visit: Payer: Self-pay | Admitting: Internal Medicine

## 2022-03-06 DIAGNOSIS — I429 Cardiomyopathy, unspecified: Secondary | ICD-10-CM

## 2022-03-06 LAB — MYOCARDIAL PERFUSION IMAGING
Angina Index: 0
Duke Treadmill Score: 9
Estimated workload: 10.1
Exercise duration (min): 8 min
Exercise duration (sec): 33 s
LV dias vol: 213 mL (ref 62–150)
LV sys vol: 121 mL
MPHR: 169 {beats}/min
Nuc Stress EF: 43 %
Peak HR: 162 {beats}/min
Percent HR: 95 %
Rest HR: 85 {beats}/min
Rest Nuclear Isotope Dose: 31.2 mCi
SDS: 9
SRS: 2
SSS: 11
ST Depression (mm): 0 mm
Stress Nuclear Isotope Dose: 29 mCi
TID: 1.04

## 2022-03-06 MED ORDER — TECHNETIUM TC 99M TETROFOSMIN IV KIT
31.2000 | PACK | Freq: Once | INTRAVENOUS | Status: AC | PRN
  Administered 2022-03-06: 31.2 via INTRAVENOUS

## 2022-03-12 ENCOUNTER — Encounter: Payer: Self-pay | Admitting: Internal Medicine

## 2022-03-12 ENCOUNTER — Ambulatory Visit: Payer: 59 | Admitting: Internal Medicine

## 2022-03-12 VITALS — BP 148/92 | HR 67 | Temp 98.0°F | Ht 75.0 in | Wt 293.0 lb

## 2022-03-12 DIAGNOSIS — F411 Generalized anxiety disorder: Secondary | ICD-10-CM | POA: Diagnosis not present

## 2022-03-12 DIAGNOSIS — R002 Palpitations: Secondary | ICD-10-CM | POA: Diagnosis not present

## 2022-03-12 DIAGNOSIS — I10 Essential (primary) hypertension: Secondary | ICD-10-CM

## 2022-03-12 MED ORDER — LOSARTAN POTASSIUM 100 MG PO TABS: 100.0000 mg | ORAL_TABLET | Freq: Every day | ORAL | 3 refills | Status: DC

## 2022-03-12 NOTE — Assessment & Plan Note (Signed)
Moderate today with underlying chronic anxiety, cont xanax prn, and gave note for out of work to mon oct 2.

## 2022-03-12 NOTE — Assessment & Plan Note (Signed)
Exam benign, ecg from last visit reviewed, for cardiac monitor r/o svt or afib

## 2022-03-12 NOTE — Patient Instructions (Addendum)
Please take all new medication as prescribed - the losartan 100 mg per day for blood pressure  You will be contacted regarding the referral for: Heart monitor  You are given the work note  Please continue all other medications as before, and refills have been done if requested.  Please have the pharmacy call with any other refills you may need.  Please keep your appointments with your specialists as you may have planned - Echo, stress test, and Cardiology soon  Please make an Appointment to return in 1 months, or sooner if needed

## 2022-03-12 NOTE — Assessment & Plan Note (Signed)
Worsening BP at home and today, for start losartan 100 mg qd

## 2022-03-12 NOTE — Progress Notes (Signed)
Patient ID: Robert Lloyd, male   DOB: 1970/07/27, 51 y.o.   MRN: [id number]        Chief Complaint: follow up HTN, palpitations, anxiety       HPI:  Robert Lloyd is a 51 y.o. male here with BP consistently elevated midl to mod, just Doesn't feel good, but hard to be specific, anxiety worsening with HA and palpitations several times per day.  All seemed to start after a covid vaccine.  Pt denies worsening chest pain, increased sob or doe, wheezing, orthopnea, PND, increased LE swelling, palpitations, dizziness or syncope.   Pt denies polydipsia, polyuria, or new focal neuro s/s.    Pt denies fever, wt loss, night sweats, loss of appetite, or other constitutional symptoms  Seemed worse overall after found out his stress test indicated reduced EF last Friday.   Asks to be out of work until at least oct 24 cardiology appt.  Wt Readings from Last 3 Encounters:  03/12/22 293 lb (132.9 kg)  03/05/22 293 lb (132.9 kg)  02/20/22 293 lb (132.9 kg)   BP Readings from Last 3 Encounters:  03/12/22 (!) 148/92  02/20/22 136/88  02/19/22 (!) 142/88         Past Medical History:  Diagnosis Date   Anxiety state, unspecified 11/09/2012   DEPRESSION 03/31/2007   Qualifier: Diagnosis of  By: Jenny Reichmann MD, Hunt Oris    HYPERLIPIDEMIA 03/31/2007   Qualifier: Diagnosis of  By: Jenny Reichmann MD, Hunt Oris    HYPERTENSION 04/10/2008   Qualifier: Diagnosis of  By: Loanne Drilling MD, Sean A    Obesity    Past Surgical History:  Procedure Laterality Date   HERNIA REPAIR     infancy   LITHOTRIPSY     rotater cuff repair Right     reports that he has never smoked. He has never used smokeless tobacco. He reports current alcohol use. He reports that he does not use drugs. family history includes Colon polyps in his brother; Hypertension in his mother. Allergies  Allergen Reactions   Sulfonamide Derivatives     UNKNOWN reaction   Current Outpatient Medications on File Prior to Visit  Medication Sig Dispense Refill    Ascorbic Acid (VITAMIN C PO) Take by mouth.     Cholecalciferol (VITAMIN D3 PO) Take by mouth.     clonazePAM (KLONOPIN) 0.5 MG tablet 1 tab by mouth twice per day as needed 60 tablet 1   metFORMIN (GLUCOPHAGE-XR) 500 MG 24 hr tablet Take 1 tablet (500 mg total) by mouth daily with breakfast. 30 tablet 0   QUERCETIN PO Take by mouth.     rosuvastatin (CRESTOR) 20 MG tablet Take 1 tablet (20 mg total) by mouth every other day. 45 tablet 3   testosterone cypionate (DEPOTESTOSTERONE CYPIONATE) 200 MG/ML injection Inject 0.5 mLs (100 mg total) into the muscle every 14 (fourteen) days. 10 mL 1   tirzepatide (MOUNJARO) 2.5 MG/0.5ML Pen Inject 2.5 mg into the skin once a week. 2 mL 11   zinc gluconate 50 MG tablet Take 50 mg by mouth daily.     zolpidem (AMBIEN CR) 12.5 MG CR tablet Take 1 tablet (12.5 mg total) by mouth at bedtime as needed. for sleep 90 tablet 1   No current facility-administered medications on file prior to visit.        ROS:  All others reviewed and negative.  Objective        PE:  BP (!) 148/92 (BP Location: Right Arm, Patient  Position: Sitting, Cuff Size: Large)   Pulse 67   Temp 98 F (36.7 C) (Oral)   Ht '6\' 3"'$  (1.905 m)   Wt 293 lb (132.9 kg)   SpO2 96%   BMI 36.62 kg/m                 Constitutional: Pt appears in NAD               HENT: Head: NCAT.                Right Ear: External ear normal.                 Left Ear: External ear normal.                Eyes: . Pupils are equal, round, and reactive to light. Conjunctivae and EOM are normal               Nose: without d/c or deformity               Neck: Neck supple. Gross normal ROM               Cardiovascular: Normal rate and regular rhythm.                 Pulmonary/Chest: Effort normal and breath sounds without rales or wheezing.                Abd:  Soft, NT, ND, + BS, no organomegaly               Neurological: Pt is alert. At baseline orientation, motor grossly intact               Skin: Skin is  warm. No rashes, no other new lesions, LE edema - none               Psychiatric: Pt behavior is normal without agitation , mod anxiety  Micro: none  Cardiac tracings I have personally interpreted today:  none  Pertinent Radiological findings (summarize): none   Lab Results  Component Value Date   WBC 7.1 02/20/2022   HGB 16.0 02/20/2022   HCT 48.2 02/20/2022   PLT 208.0 02/20/2022   GLUCOSE 108 (H) 02/20/2022   CHOL 138 12/12/2021   TRIG 140.0 12/12/2021   HDL 33.60 (L) 12/12/2021   LDLDIRECT 160.0 03/15/2018   LDLCALC 77 12/12/2021   ALT 26 02/20/2022   AST 19 02/20/2022   NA 138 02/20/2022   K 4.3 02/20/2022   CL 105 02/20/2022   CREATININE 0.78 02/20/2022   BUN 14 02/20/2022   CO2 25 02/20/2022   TSH 1.85 02/20/2022   PSA 0.21 12/12/2021   HGBA1C 6.0 12/12/2021   Assessment/Plan:  Robert Lloyd is a 51 y.o. White or Caucasian [1] male with  has a past medical history of Anxiety state, unspecified (11/09/2012), DEPRESSION (03/31/2007), HYPERLIPIDEMIA (03/31/2007), HYPERTENSION (04/10/2008), and Obesity.  Essential hypertension Worsening BP at home and today, for start losartan 100 mg qd  Rapid palpitations Exam benign, ecg from last visit reviewed, for cardiac monitor r/o svt or afib  Anxiety state Moderate today with underlying chronic anxiety, cont xanax prn, and gave note for out of work to mon oct 2.    Followup: Return in about 4 weeks (around 04/09/2022).  [name], MD 03/12/2022 12:39 PM [facility]

## 2022-03-13 ENCOUNTER — Telehealth: Payer: Self-pay | Admitting: Internal Medicine

## 2022-03-13 NOTE — Telephone Encounter (Signed)
Pt came in and dropped off FMLA paperwork that he needs filled out for his job and faxed when its done. Paperwork was put in provider mailbox at the front.

## 2022-03-15 ENCOUNTER — Other Ambulatory Visit: Payer: Self-pay | Admitting: Internal Medicine

## 2022-03-15 NOTE — Telephone Encounter (Signed)
Please refill as per office routine med refill policy (all routine meds to be refilled for 3 mo or monthly (per pt preference) up to one year from last visit, then month to month grace period for 3 mo, then further med refills will have to be denied) ? ?

## 2022-03-16 ENCOUNTER — Other Ambulatory Visit: Payer: Self-pay | Admitting: Internal Medicine

## 2022-03-19 NOTE — Telephone Encounter (Signed)
Patient is calling in asking if the paperwork is ready, would like a copy after a fax.

## 2022-03-20 ENCOUNTER — Ambulatory Visit: Payer: 59 | Attending: Internal Medicine

## 2022-03-20 ENCOUNTER — Ambulatory Visit (HOSPITAL_COMMUNITY): Payer: 59 | Attending: Cardiology

## 2022-03-20 DIAGNOSIS — R002 Palpitations: Secondary | ICD-10-CM | POA: Diagnosis not present

## 2022-03-20 DIAGNOSIS — I429 Cardiomyopathy, unspecified: Secondary | ICD-10-CM | POA: Diagnosis present

## 2022-03-20 DIAGNOSIS — I428 Other cardiomyopathies: Secondary | ICD-10-CM | POA: Diagnosis not present

## 2022-03-20 NOTE — Telephone Encounter (Signed)
Unable to leave message for a call back regarding FMLA forms

## 2022-03-20 NOTE — Telephone Encounter (Signed)
Pt has got the fax number to his job and want paperwork fax and he will come pick up the original.  Fax [phone] Attn: Freida Busman

## 2022-03-20 NOTE — Telephone Encounter (Signed)
Form completed and signed and placed at front office for pick up. Patient notified.

## 2022-03-20 NOTE — Telephone Encounter (Signed)
Patient is returning call.  °

## 2022-03-23 ENCOUNTER — Telehealth: Payer: Self-pay

## 2022-03-23 NOTE — Telephone Encounter (Signed)
Patient is calling in wondering if Dr.John would write a letter that states to excuse him for all future doctors appointments and for when he starts experiencing symptoms related to the FMLA that way we do not have to fill out FMLA paperwork and it would not count against him when he leaves. Asked if we can call him once it is done.

## 2022-03-24 LAB — ECHOCARDIOGRAM COMPLETE
Area-P 1/2: 5.13 cm2
S' Lateral: 4.6 cm

## 2022-03-24 NOTE — Telephone Encounter (Signed)
No sorry, I decline to wrist this kind of letter, bc the FMLA is the form we need to go by

## 2022-04-05 NOTE — Progress Notes (Unsigned)
Cardiology Office Note:    Date:  04/05/2022   ID:  Robert Lloyd, DOB 1971/03/29, MRN [id number]  PCP:  Biagio Borg, MD   [place] Providers Cardiologist:  None { Click to update primary MD,subspecialty MD or APP then REFRESH:1}    Referring MD: Biagio Borg, MD   No chief complaint on file. ***  History of Present Illness:    Robert Lloyd is a 51 y.o. male with a hx of ***  Past Medical History:  Diagnosis Date   Anxiety state, unspecified 11/09/2012   DEPRESSION 03/31/2007   Qualifier: Diagnosis of  By: Jenny Reichmann MD, Hunt Oris    HYPERLIPIDEMIA 03/31/2007   Qualifier: Diagnosis of  By: Jenny Reichmann MD, Hunt Oris    HYPERTENSION 04/10/2008   Qualifier: Diagnosis of  By: Loanne Drilling MD, Sean A    Obesity     Past Surgical History:  Procedure Laterality Date   HERNIA REPAIR     infancy   LITHOTRIPSY     rotater cuff repair Right     Current Medications: No outpatient medications have been marked as taking for the 04/07/22 encounter (Appointment) with Freada Bergeron, MD.     Allergies:   Sulfonamide derivatives   Social History   Socioeconomic History   Marital status: Married    Spouse name: n/a   Number of children: 0   Years of education: 12+   Highest education level: Not on file  Occupational History   Occupation: Gaffer    Comment: [employer]  Tobacco Use   Smoking status: Never   Smokeless tobacco: Never  Vaping Use   Vaping Use: Never used  Substance and Sexual Activity   Alcohol use: Yes    Alcohol/week: 0.0 standard drinks of alcohol    Comment: occasionally   Drug use: No   Sexual activity: Not on file  Other Topics Concern   Not on file  Social History Narrative   Lives alone. Twin brother lives next door with his wife and son.   Social Determinants of Health   Financial Resource Strain: Not on file  Food Insecurity: Not on file  Transportation Needs: Not on file  Physical Activity: Not on file  Stress:  Not on file  Social Connections: Not on file     Family History: The patient's ***family history includes Colon polyps in his brother; Hypertension in his mother. There is no history of Colon cancer, Esophageal cancer, Rectal cancer, or Stomach cancer.  ROS:   Please see the history of present illness.    *** All other systems reviewed and are negative.  EKGs/Labs/Other Studies Reviewed:    The following studies were reviewed today: ***  EKG:  EKG is *** ordered today.  The ekg ordered today demonstrates ***  Recent Labs: 02/20/2022: ALT 26; BUN 14; Creatinine, Ser 0.78; Hemoglobin 16.0; Platelets 208.0; Potassium 4.3; Pro B Natriuretic peptide (BNP) 23.0; Sodium 138; TSH 1.85  Recent Lipid Panel    Component Value Date/Time   CHOL 138 12/12/2021 1344   TRIG 140.0 12/12/2021 1344   HDL 33.60 (L) 12/12/2021 1344   CHOLHDL 4 12/12/2021 1344   VLDL 28.0 12/12/2021 1344   LDLCALC 77 12/12/2021 1344   LDLCALC 80 12/29/2019 0856   LDLDIRECT 160.0 03/15/2018 1624     Risk Assessment/Calculations:   {Does this patient have ATRIAL FIBRILLATION?:[phone]}  No BP recorded.  {Refresh Note OR Click here to enter BP  :1}***  Physical Exam:    VS:  There were no vitals taken for this visit.    Wt Readings from Last 3 Encounters:  03/12/22 293 lb (132.9 kg)  03/05/22 293 lb (132.9 kg)  02/20/22 293 lb (132.9 kg)     GEN: *** Well nourished, well developed in no acute distress HEENT: Normal NECK: No JVD; No carotid bruits LYMPHATICS: No lymphadenopathy CARDIAC: ***RRR, no murmurs, rubs, gallops RESPIRATORY:  Clear to auscultation without rales, wheezing or rhonchi  ABDOMEN: Soft, non-tender, non-distended MUSCULOSKELETAL:  No edema; No deformity  SKIN: Warm and dry NEUROLOGIC:  Alert and oriented x 3 PSYCHIATRIC:  Normal affect   ASSESSMENT:    No diagnosis found. PLAN:    In order of problems listed above:  ***      {Are you ordering a CV Procedure  (e.g. stress test, cath, DCCV, TEE, etc)?   Press F2        :[id number]}    Medication Adjustments/Labs and Tests Ordered: Current medicines are reviewed at length with the patient today.  Concerns regarding medicines are outlined above.  No orders of the defined types were placed in this encounter.  No orders of the defined types were placed in this encounter.   There are no Patient Instructions on file for this visit.   Signed, Freada Bergeron, MD  04/05/2022 7:55 PM    [place]

## 2022-04-07 ENCOUNTER — Ambulatory Visit: Payer: 59 | Attending: Cardiology | Admitting: Cardiology

## 2022-04-07 ENCOUNTER — Encounter: Payer: Self-pay | Admitting: Cardiology

## 2022-04-07 ENCOUNTER — Other Ambulatory Visit: Payer: Self-pay | Admitting: Internal Medicine

## 2022-04-07 VITALS — BP 140/92 | HR 92 | Ht 75.0 in | Wt 298.8 lb

## 2022-04-07 DIAGNOSIS — R5383 Other fatigue: Secondary | ICD-10-CM

## 2022-04-07 DIAGNOSIS — R7303 Prediabetes: Secondary | ICD-10-CM

## 2022-04-07 DIAGNOSIS — E7800 Pure hypercholesterolemia, unspecified: Secondary | ICD-10-CM | POA: Diagnosis not present

## 2022-04-07 DIAGNOSIS — E669 Obesity, unspecified: Secondary | ICD-10-CM

## 2022-04-07 DIAGNOSIS — E119 Type 2 diabetes mellitus without complications: Secondary | ICD-10-CM

## 2022-04-07 DIAGNOSIS — I10 Essential (primary) hypertension: Secondary | ICD-10-CM

## 2022-04-07 DIAGNOSIS — R079 Chest pain, unspecified: Secondary | ICD-10-CM | POA: Diagnosis not present

## 2022-04-07 MED ORDER — AMLODIPINE BESYLATE 2.5 MG PO TABS: 2.5000 mg | ORAL_TABLET | Freq: Every day | ORAL | 2 refills | Status: DC

## 2022-04-07 MED ORDER — OZEMPIC (0.25 OR 0.5 MG/DOSE) 2 MG/3ML ~~LOC~~ SOPN: PEN_INJECTOR | SUBCUTANEOUS | 11 refills | Status: DC

## 2022-04-07 NOTE — Progress Notes (Signed)
Cardiology Office Note:    Date:  04/07/2022   ID:  AMAURIS DEBOIS, DOB [birth year], MRN [id number]  PCP:  Biagio Borg, MD   [facility] Providers Cardiologist:  None {  Referring MD: Biagio Borg, MD    History of Present Illness:    Robert Lloyd is a 51 y.o. male with a hx of HTN, HLD and depression who was referred by Dr. Jenny Reichmann for concern for low EF on stress testing.  Patient with recent worsening fatigue and just feeling "off." Had NST which showed no ischemia or infarction. EF 43%. Follow-up TTE with EF 50-55%, no significant valve disease. He is now referred to Cardiology for further evaluation.  Today, he reports that he has been experiencing fatigue with exertion. This has been ongoing for several months. He was recently started on antihypertensives and overall, his symptoms have significantly improved. At the time, he was also having intermittent, nonexertional chest discomfort but this has also resolved.   His fatigue was so bad that he went to Dr. Jenny Reichmann and he started him on testosterone after his level showed 100. He was feeling better while he was taking it, but he stopped because his blood pressure started to rise.   At home he checks his blood pressure 3 times a day. It usually higher first thing in the morning but is better at night. Last night it was 126/82. This morning it was 130/ 90s, he could not remember the exact reading. Has been taking his losartan without issue. Was written for [facility] but is awaiting insurance approval. He is also working on weight loss.  He has been taking his statin every other day due to having muscle aches when he was taking them everyday.  He denies any shortness of breath, or peripheral edema. No lightheadedness, headaches, syncope, orthopnea, or PND.  Past Medical History:  Diagnosis Date   Anxiety state, unspecified 11/09/2012   DEPRESSION 03/31/2007   Qualifier: Diagnosis of  By: Jenny Reichmann MD, Hunt Oris     HYPERLIPIDEMIA 03/31/2007   Qualifier: Diagnosis of  By: Jenny Reichmann MD, Hunt Oris    HYPERTENSION 04/10/2008   Qualifier: Diagnosis of  By: Loanne Drilling MD, Sean A    Obesity     Past Surgical History:  Procedure Laterality Date   HERNIA REPAIR     infancy   LITHOTRIPSY     rotater cuff repair Right     Current Medications: Current Meds  Medication Sig   amLODipine (NORVASC) 2.5 MG tablet Take 1 tablet (2.5 mg total) by mouth at bedtime.   Ascorbic Acid (VITAMIN C PO) Take by mouth.   Cholecalciferol (VITAMIN D3 PO) Take by mouth.   clonazePAM (KLONOPIN) 0.5 MG tablet 1 tab by mouth twice per day as needed   losartan (COZAAR) 100 MG tablet Take 1 tablet (100 mg total) by mouth daily.   metFORMIN (GLUCOPHAGE-XR) 500 MG 24 hr tablet TAKE 1 TABLET BY MOUTH EVERY DAY WITH BREAKFAST   QUERCETIN PO Take by mouth.   rosuvastatin (CRESTOR) 20 MG tablet Take 1 tablet (20 mg total) by mouth every other day.   tirzepatide [facility]) 2.5 MG/0.5ML Pen Inject 2.5 mg into the skin once a week.   zinc gluconate 50 MG tablet Take 50 mg by mouth daily.   zolpidem (AMBIEN CR) 12.5 MG CR tablet Take 1 tablet (12.5 mg total) by mouth at bedtime as needed. for sleep     Allergies:   Sulfonamide derivatives   Social  History   Socioeconomic History   Marital status: Married    Spouse name: n/a   Number of children: 0   Years of education: 12+   Highest education level: Not on file  Occupational History   Occupation: Gaffer    Comment: [employer]  Tobacco Use   Smoking status: Never   Smokeless tobacco: Never  Vaping Use   Vaping Use: Never used  Substance and Sexual Activity   Alcohol use: Yes    Alcohol/week: 0.0 standard drinks of alcohol    Comment: occasionally   Drug use: No   Sexual activity: Not on file  Other Topics Concern   Not on file  Social History Narrative   Lives alone. Twin brother lives next door with his wife and son.   Social Determinants of Health    Financial Resource Strain: Not on file  Food Insecurity: Not on file  Transportation Needs: Not on file  Physical Activity: Not on file  Stress: Not on file  Social Connections: Not on file     Family History: The patient's family history includes Colon polyps in his brother; Hypertension in his mother. There is no history of Colon cancer, Esophageal cancer, Rectal cancer, or Stomach cancer.  ROS:   Please see the history of present illness.    Review of Systems  Constitutional:  Negative for chills and fever.  HENT:  Negative for ear pain and tinnitus.   Eyes:  Negative for blurred vision and double vision.  Respiratory:  Negative for cough and hemoptysis.   Cardiovascular:  Positive for chest pain and palpitations.  Gastrointestinal:  Negative for heartburn, nausea and vomiting.  Genitourinary:  Negative for dysuria and urgency.  Musculoskeletal:  Positive for neck pain.  Skin:  Negative for rash.  Neurological:  Positive for tingling (face). Negative for tremors, loss of consciousness and headaches.  Endo/Heme/Allergies:  Does not bruise/bleed easily.  Psychiatric/Behavioral:  Negative for depression and suicidal ideas. The patient is nervous/anxious.     All other systems reviewed and are negative.  EKGs/Labs/Other Studies Reviewed:    The following studies were reviewed today: TTE 2022/04/01: IMPRESSIONS     1. Left ventricular ejection fraction, by estimation, is 50 to 55%. The  left ventricle has low normal function. The left ventricle has no regional  wall motion abnormalities. Left ventricular diastolic parameters were  normal.   2. Right ventricular systolic function is normal. The right ventricular  size is normal.   3. Left atrial size was mildly dilated.   4. The mitral valve is normal in structure. Trivial mitral valve  regurgitation. No evidence of mitral stenosis.   5. The aortic valve is normal in structure. Aortic valve regurgitation is  not visualized.  No aortic stenosis is present.   6. The inferior vena cava is normal in size with greater than 50%  respiratory variability, suggesting right atrial pressure of 3 mmHg.   NST 02/2022:   Findings are consistent with no prior ischemia and no prior myocardial infarction. The study is low risk.   No ST deviation was noted.   LV perfusion is normal. There is no evidence of ischemia. There is no evidence of infarction.   Left ventricular function is abnormal. Global function is mildly reduced. There were no regional wall motion abnormalities. Nuclear stress EF: 43 %. The left ventricular ejection fraction is moderately decreased (30-44%). End diastolic cavity size is normal. End systolic cavity size is normal.   Prior study not  available for comparison.   Normal perfusion no ischemia/infarct Frequent PVCls with stress and recovery. HTN  Response to exercise EF estimated at 43% Suggest correlation with TTE/MRI  EKG: EKG is personally reviewed.  04/06/2022: EKG was not ordered.   Recent Labs: 02/20/2022: ALT 26; BUN 14; Creatinine, Ser 0.78; Hemoglobin 16.0; Platelets 208.0; Potassium 4.3; Pro B Natriuretic peptide (BNP) 23.0; Sodium 138; TSH 1.85  Recent Lipid Panel    Component Value Date/Time   CHOL 138 12/12/2021 1344   TRIG 140.0 12/12/2021 1344   HDL 33.60 (L) 12/12/2021 1344   CHOLHDL 4 12/12/2021 1344   VLDL 28.0 12/12/2021 1344   LDLCALC 77 12/12/2021 1344   LDLCALC 80 12/29/2019 0856   LDLDIRECT 160.0 03/15/2018 1624     Risk Assessment/Calculations:      HYPERTENSION CONTROL Vitals:   04/07/22 0816 04/07/22 0855  BP: (!) 138/90 (!) 140/92    The patient's blood pressure is elevated above target today.  In order to address the patient's elevated BP: A new medication was prescribed today.            Physical Exam:    VS:  BP (!) 140/92 (BP Location: Right Arm, Patient Position: Sitting, Cuff Size: Large)   Pulse 92   Ht '6\' 3"'$  (1.905 m)   Wt 298 lb 12.8 oz (135.5  kg)   SpO2 97%   BMI 37.35 kg/m     Wt Readings from Last 3 Encounters:  04/07/22 298 lb 12.8 oz (135.5 kg)  03/12/22 293 lb (132.9 kg)  03/05/22 293 lb (132.9 kg)     GEN:  Well nourished, well developed in no acute distress HEENT: Normal NECK: No JVD; No carotid bruits CARDIAC: RRR, no murmurs, rubs, gallops RESPIRATORY:  Clear to auscultation without rales, wheezing or rhonchi  ABDOMEN: Soft, non-tender, non-distended MUSCULOSKELETAL:  No edema; No deformity  SKIN: Warm and dry NEUROLOGIC:  Alert and oriented x 3 PSYCHIATRIC:  Normal affect   ASSESSMENT:    1. Chest pain, unspecified type   2. Pure hypercholesterolemia   3. Diabetes mellitus with coincident hypertension (HCC)   4. Other fatigue   5. Obesity (BMI 30-39.9)    PLAN:    In order of problems listed above:  #Fatigue with Exertion: Patient with several month history of fatigue with exertion and occasional chest discomfort. Thorough work-up performed by Dr. Jenny Reichmann which included NST which showed no ischemia or infarction. EF 43% on stress but follow-up TTE with EF 50-55% with no valve disease. Overall symptoms are significantly improved with blood pressure control. Discussed option of continued monitoring versus coronary CTA but given that he is feeling better, he would like to continue with conservative management and will call us if symptoms worsen. -Continue BP control as below -Continue weight loss efforts -If symptoms fail to improve, can pursue coronary CTA at that time  #HTN: Blood pressure is much better controlled but having elevated readings in the AM. -Continue losartan '100mg'$  daily -Start amlodipine 2.'5mg'$  q HS  #HLD: #Coronary CA in LAD on CT chest -Continue crestor '20mg'$  daily -LDL 77 in 11/2021; goal <70 -Repeat lipids at 72monthvisit to ensure improving with weight loss  #DMII: -Patient plans to start mounjaro -Continue metformin  #Obesity: BMI 37. Discussed diet and exercise as  below.  Exercise recommendations: Goal of exercising for at least 30 minutes a day, at least 5 times per week.  Please exercise to a moderate exertion.  This means that while exercising it is difficult  to speak in full sentences, however you are not so short of breath that you feel you must stop, and not so comfortable that you can carry on a full conversation.  Exertion level should be approximately a 5/10, if 10 is the most exertion you can perform.  Diet recommendations: Recommend a heart healthy diet such as the Mediterranean diet.  This diet consists of plant based foods, healthy fats, lean meats, olive oil.  It suggests limiting the intake of simple carbohydrates such as white breads, pastries, and pastas.  It also limits the amount of red meat, wine, and dairy products such as cheese that one should consume on a daily basis.          Follow up: 6 months  Medication Adjustments/Labs and Tests Ordered: Current medicines are reviewed at length with the patient today.  Concerns regarding medicines are outlined above.  Orders Placed This Encounter  Procedures   Lipid Profile   Meds ordered this encounter  Medications   amLODipine (NORVASC) 2.5 MG tablet    Sig: Take 1 tablet (2.5 mg total) by mouth at bedtime.    Dispense:  90 tablet    Refill:  2    Patient Instructions  Medication Instructions:   START TAKING AMLODIPINE 2.5 MG BY MOUTH DAILY AT BEDTIME  *If you need a refill on your cardiac medications before your next appointment, please call your pharmacy*   Lab Work:  IN 6 MONTHS HERE IN THE OFFICE--CHECK LIPIDS AT THAT TIME--PLEASE COME FASTING TO THIS LAB APPOINTMENT  If you have labs (blood work) drawn today and your tests are completely normal, you will receive your results only by: [place] (if you have MyChart) OR A paper copy in the mail If you have any lab test that is abnormal or we need to change your treatment, we will call you to review the  results.    Follow-Up: At [facility], you and your health needs are our priority.  As part of our continuing mission to provide you with exceptional heart care, we have created designated Provider Care Teams.  These Care Teams include your primary Cardiologist (physician) and Advanced Practice Providers (APPs -  Physician Assistants and Nurse Practitioners) who all work together to provide you with the care you need, when you need it.  We recommend signing up for the patient portal called "MyChart".  Sign up information is provided on this After Visit Summary.  MyChart is used to connect with patients for Virtual Visits (Telemedicine).  Patients are able to view lab/test results, encounter notes, upcoming appointments, etc.  Non-urgent messages can be sent to your provider as well.   To learn more about what you can do with MyChart, go to [email or web address].    Your next appointment:   6 month(s)  The format for your next appointment:   In Person  Provider:   DR. Johney Frame   Important Information About Sugar          Burnett Kanaris Ford,acting as a scribe for Freada Bergeron, MD.,have documented all relevant documentation on the behalf of Freada Bergeron, MD,as directed by  Freada Bergeron, MD while in the presence of Freada Bergeron, MD.   I, Freada Bergeron, MD, have reviewed all documentation for this visit. The documentation on 04/07/22 for the exam, diagnosis, procedures, and orders are all accurate and complete.   Signed, Freada Bergeron, MD  04/07/2022 9:20 AM    [place]

## 2022-04-07 NOTE — Patient Instructions (Signed)
Medication Instructions:   START TAKING AMLODIPINE 2.5 MG BY MOUTH DAILY AT BEDTIME  *If you need a refill on your cardiac medications before your next appointment, please call your pharmacy*   Lab Work:  IN 6 MONTHS HERE IN THE OFFICE--CHECK LIPIDS AT THAT TIME--PLEASE COME FASTING TO THIS LAB APPOINTMENT  If you have labs (blood work) drawn today and your tests are completely normal, you will receive your results only by: [place] (if you have MyChart) OR A paper copy in the mail If you have any lab test that is abnormal or we need to change your treatment, we will call you to review the results.    Follow-Up: At [facility], you and your health needs are our priority.  As part of our continuing mission to provide you with exceptional heart care, we have created designated Provider Care Teams.  These Care Teams include your primary Cardiologist (physician) and Advanced Practice Providers (APPs -  Physician Assistants and Nurse Practitioners) who all work together to provide you with the care you need, when you need it.  We recommend signing up for the patient portal called "MyChart".  Sign up information is provided on this After Visit Summary.  MyChart is used to connect with patients for Virtual Visits (Telemedicine).  Patients are able to view lab/test results, encounter notes, upcoming appointments, etc.  Non-urgent messages can be sent to your provider as well.   To learn more about what you can do with MyChart, go to [email or web address].    Your next appointment:   6 month(s)  The format for your next appointment:   In Person  Provider:   DR. Johney Frame   Important Information About Sugar

## 2022-04-08 ENCOUNTER — Other Ambulatory Visit: Payer: Self-pay | Admitting: Internal Medicine

## 2022-05-06 ENCOUNTER — Telehealth: Payer: Self-pay | Admitting: Internal Medicine

## 2022-05-06 NOTE — Telephone Encounter (Signed)
Pt requesting new prescription for:  INCREASED dosage of MOUNJARO  Preferred Pharmacy:  [facility].  Pt ph [phone]  Last appt:  03/12/22  Next appt: Not Scheduled  Pt ph [phone]

## 2022-05-11 MED ORDER — TIRZEPATIDE 5 MG/0.5ML ~~LOC~~ SOAJ: 5.0000 mg | SUBCUTANEOUS | 3 refills | Status: DC

## 2022-05-11 NOTE — Telephone Encounter (Signed)
[place] for increase - done erx

## 2022-05-11 NOTE — Telephone Encounter (Signed)
Patient is requesting to increase mounjaro

## 2022-05-15 ENCOUNTER — Telehealth (INDEPENDENT_AMBULATORY_CARE_PROVIDER_SITE_OTHER): Payer: 59 | Admitting: Nurse Practitioner

## 2022-05-15 ENCOUNTER — Encounter: Payer: Self-pay | Admitting: Nurse Practitioner

## 2022-05-15 VITALS — BP 142/98 | HR 114

## 2022-05-15 DIAGNOSIS — U071 COVID-19: Secondary | ICD-10-CM

## 2022-05-15 MED ORDER — NIRMATRELVIR/RITONAVIR (PAXLOVID)TABLET: 3.0000 | ORAL_TABLET | Freq: Two times a day (BID) | ORAL | 0 refills | Status: AC

## 2022-05-15 NOTE — Progress Notes (Signed)
   Established Patient Office Visit  An audio-only tele-health visit was completed today for this patient. I connected with  Robert Lloyd on 05/15/22 utilizing audio-only technology and verified that I am speaking with the correct person using two identifiers. The patient was located at their home, and I was located at the office of [place] at [facility] during the encounter. I discussed the limitations of evaluation and management by telemedicine. The patient expressed understanding and agreed to proceed.     Subjective   Patient ID: Robert Lloyd, male    DOB: 15-Feb-1971  Age: 51 y.o. MRN: [id number]  Chief Complaint  Patient presents with   Covid Exposure    Symptom onset yesterday.  Exposed to wife who tested positive for COVID-19.  Reports that he took an at home test as well which was positive.  Has been vaccinated for COVID x 3.  Has history of type 2 diabetes and obesity.    Review of Systems  Constitutional:  Positive for chills. Negative for fever.  HENT:  Positive for sore throat.   Respiratory:  Positive for cough and sputum production. Negative for shortness of breath.   Cardiovascular:  Negative for chest pain.  Gastrointestinal:  Negative for diarrhea, nausea and vomiting.  Musculoskeletal:  Positive for myalgias.  Neurological:  Positive for headaches.      Objective:     BP (!) 142/98   Pulse (!) 114    Physical Exam Comprehensive physical exam not completed today as office visit was conducted remotely.  Patient sounded well over telephone, he did cough, but did not appear to be in any acute distress.  Patient was alert and oriented, and appeared to have appropriate judgment.   No results found for any visits on 05/15/22.    The 10-year ASCVD risk score (Arnett DK, et al., 2019) is: 9.2%    Assessment & Plan:   Problem List Items Addressed This Visit       Other   COVID-19 - Primary    Acute, per shared decision making will  treat with Paxlovid due to patient's comorbidities which put him at increased risk for severe disease.  Patient educated regarding possible drug interactions in which drugs to hold.  Patient also educated to treat symptomatically as well as to walk throughout the home as tolerated to promote lung expansion and prevent pneumonia/blood clot.  Patient reports understanding.  Patient encouraged to proceed to the emergency department or call our office if symptoms worsen.  Patient educated regarding current isolation recommendations.  Patient reports understanding.      Relevant Medications   nirmatrelvir/ritonavir EUA (PAXLOVID) 20 x 150 MG & 10 x '100MG'$  TABS    Return if symptoms worsen or fail to improve.  Total time spent on the telephone today was 17 minutes and 38 seconds.   Ailene Ards, NP

## 2022-05-15 NOTE — Patient Instructions (Addendum)
Start your paxlovid tomorrow morning. Stop your rosuvastatin while taking the paxlovid. Avoid taking the klonopin and ambien while you are on the paxlovid. Your amlodipine may intereact with paxlovid so if you feel dizzy, lightheaded, or off in anyway check your blood pressure. If it is <120/60 temporarily stop the amlodipine until you are done with the paxlovid as well. If you start to experience shortness of breath, high fevers, or just feel worse despite taking the paxlovid call our office or proceed to the emergency department for evaluation in person. Please reach out with any questions.

## 2022-05-15 NOTE — Assessment & Plan Note (Addendum)
Acute, per shared decision making will treat with Paxlovid due to patient's comorbidities which put him at increased risk for severe disease.  Patient educated regarding possible drug interactions in which drugs to hold.  Patient also educated to treat symptomatically as well as to walk throughout the home as tolerated to promote lung expansion and prevent pneumonia/blood clot.  Patient reports understanding.  Patient encouraged to proceed to the emergency department or call our office if symptoms worsen.  Patient educated regarding current isolation recommendations.  Patient reports understanding.

## 2022-06-16 ENCOUNTER — Other Ambulatory Visit: Payer: Self-pay | Admitting: Internal Medicine

## 2022-08-07 ENCOUNTER — Telehealth: Payer: Self-pay | Admitting: Internal Medicine

## 2022-08-07 ENCOUNTER — Other Ambulatory Visit: Payer: Self-pay

## 2022-08-07 MED ORDER — TIRZEPATIDE 5 MG/0.5ML ~~LOC~~ SOAJ: 5.0000 mg | SUBCUTANEOUS | 3 refills | Status: DC

## 2022-08-07 NOTE — Telephone Encounter (Signed)
Pt calling requesting increase dosage for MOUNJARO.  [facility] [place] [id number] [place] [place] [facility]12[place]

## 2022-08-07 NOTE — Telephone Encounter (Signed)
Rx request sent to pharmacy, patient informed

## 2022-08-21 MED ORDER — TIRZEPATIDE 7.5 MG/0.5ML ~~LOC~~ SOAJ: 7.5000 mg | SUBCUTANEOUS | 11 refills | Status: DC

## 2022-08-21 NOTE — Telephone Encounter (Signed)
Done erx 

## 2022-08-21 NOTE — Telephone Encounter (Signed)
Patient called and said the wrong dosage was sent to the pharmacy. He said he was supposed to receive the 7.5 dosage. Best callback number is [phone].

## 2022-08-21 NOTE — Telephone Encounter (Signed)
Ok done erx to [facility]

## 2022-08-21 NOTE — Addendum Note (Signed)
Addended by: Biagio Borg on: 08/21/2022 12:07 PM   Modules accepted: Orders

## 2022-09-01 ENCOUNTER — Other Ambulatory Visit: Payer: Self-pay | Admitting: Internal Medicine

## 2022-09-01 MED ORDER — TIRZEPATIDE 7.5 MG/0.5ML ~~LOC~~ SOAJ: 7.5000 mg | SUBCUTANEOUS | 11 refills | Status: DC

## 2022-10-16 ENCOUNTER — Encounter: Payer: Self-pay | Admitting: *Deleted

## 2022-10-16 ENCOUNTER — Telehealth: Payer: Self-pay | Admitting: Cardiology

## 2022-10-16 DIAGNOSIS — R5383 Other fatigue: Secondary | ICD-10-CM

## 2022-10-16 DIAGNOSIS — E669 Obesity, unspecified: Secondary | ICD-10-CM

## 2022-10-16 DIAGNOSIS — E119 Type 2 diabetes mellitus without complications: Secondary | ICD-10-CM

## 2022-10-16 DIAGNOSIS — R072 Precordial pain: Secondary | ICD-10-CM

## 2022-10-16 DIAGNOSIS — R079 Chest pain, unspecified: Secondary | ICD-10-CM

## 2022-10-16 MED ORDER — METOPROLOL TARTRATE 100 MG PO TABS: 100.0000 mg | ORAL_TABLET | Freq: Once | ORAL | 0 refills | Status: DC

## 2022-10-16 NOTE — Telephone Encounter (Signed)
Pt is calling in with complaints of feeling nauseated, sick to his stomach, and having ongoing fatigue with exertion.    He reports that he gets fatigue after anything exertional, like grocery shopping or going to the gym.  He states he is concerned about his fit bit heart rate readings jumping all over the place as well.    He states he has a fit bit shows that his HR's are jumping anywhere from  the 50s to the 90s.  He states his pressures are running in the 120s systolic 60-70s diastolic.  He states his mounjaro was recently increased by his PCP about a month ago, and this could be causing his upset stomach.  Dr. Shari Prows did speak with him directly on the phone for about 15 mins and advised him that his symptoms could be coming from his PVCs (found on zio monitor back in 04/2022) or from recent increase of mounjaro.  Dr. Shari Prows informed him that being he is still experiencing exertional fatigue since last OV back in Nov 2023, she would like for him to proceed in getting a cardiac CT done first, so that we can get more data back and make sure his symptoms aren't steaming from any issues with his heart arteries.   She endorsed to him that if his CTA comes back normal, then we can further look into treating his PVC burden, with a prescribed beta blocker like metoprolol.  Dr. Shari Prows did advise him that he can skip his next dose of mounjaro and see if that helps with his complaints of feeling nauseated and sick to his stomach.  She advised if the upset stomach improves with the next dose held, then he should follow-up with his PCP thereafter, to have him advise on decreasing the dose of his mounjaro thereafter.  She informed the pt that his HR/BP readings look good, and it is very common to note your rates jumping all over the place, when PVCs occur.  Pt does not have a history of afib.  Pt is aware that we will order for him to get a Cardiac CT done.  He was made aware by Dr. Shari Prows that he  will need to take  a one time dose of metoprolol 100 mg 2 hours prior to his scan.    Pt aware that I will place the order in the system and have our CT Scheduler call him back sometime soon to arrange this appt.  He is aware that he will need to come into the office to have a BMET done, and we will arrange this closer to the time of his CTA appt.  Pt is also aware that I will send him his CTA instructions to further review and refer to, when his CTA is scheduled.   Confirmed the pharmacy of choice with the pt.   Pt verbalized understanding and agrees with this plan.

## 2022-10-16 NOTE — Telephone Encounter (Signed)
Pt c/o BP issue: STAT if pt c/o blurred vision, one-sided weakness or slurred speech  1. What are your last 5 BP readings?   Around 128/70 (taken a few minutes ago) - 120?/80  2. Are you having any other symptoms (ex. Dizziness, headache, blurred vision, passed out)?   Sick to his stomach, lightheaded and went to bed last night with a headache and work up with a headache  3. What is your BP issue?    Patient states his BP and HR readings the past couple of days has been low.

## 2022-10-16 NOTE — Telephone Encounter (Signed)
   Your cardiac CT will be scheduled at one of the below locations:   [facility] [address] [place], [place] [address] [phone]  If scheduled at [facility], please arrive at the [facility] and Children's Entrance (Entrance C2) of [facility] 30 minutes prior to test start time. You can use the FREE valet parking offered at entrance C (encouraged to control the heart rate for the test)  Proceed to the [facility] Radiology Department (first floor) to check-in and test prep.  All radiology patients and guests should use entrance C2 at [facility], accessed from [facility], even though the hospital's physical address listed is [address].     Please follow these instructions carefully (unless otherwise directed):  Hold all erectile dysfunction medications at least 3 days (72 hrs) prior to test. (Ie viagra, cialis, sildenafil, tadalafil, etc) We will administer nitroglycerin during this exam.   On the Night Before the Test: Be sure to Drink plenty of water. Do not consume any caffeinated/decaffeinated beverages or chocolate 12 hours prior to your test. Do not take any antihistamines 12 hours prior to your test.   On the Day of the Test: Drink plenty of water until 1 hour prior to the test. Do not eat any food 1 hour prior to test. You may take your regular medications prior to the test.  Take metoprolol 100 mg BY MOUTH  (Lopressor) two hours prior to test.        After the Test: Drink plenty of water. After receiving IV contrast, you may experience a mild flushed feeling. This is normal. On occasion, you may experience a mild rash up to 24 hours after the test. This is not dangerous. If this occurs, you can take Benadryl 25 mg and increase your fluid intake. If you experience trouble breathing, this can be serious. If it is severe call 911 IMMEDIATELY. If it is mild, please call our office. If you take any of  these medications: Glipizide/Metformin, Avandament, Glucavance, please do not take 48 hours after completing test unless otherwise instructed.  We will call to schedule your test 2-4 weeks out understanding that some insurance companies will need an authorization prior to the service being performed.   For non-scheduling related questions, please contact the cardiac imaging nurse navigator should you have any questions/concerns: Rockwell Alexandria, Cardiac Imaging Nurse Navigator Larey Brick, Cardiac Imaging Nurse Navigator [facility] Direct Office Dial: [phone]   For scheduling needs, including cancellations and rescheduling, please call [place], [phone].

## 2022-10-19 ENCOUNTER — Other Ambulatory Visit: Payer: Self-pay

## 2022-10-19 ENCOUNTER — Ambulatory Visit: Payer: 59

## 2022-10-19 DIAGNOSIS — E7800 Pure hypercholesterolemia, unspecified: Secondary | ICD-10-CM

## 2022-10-19 DIAGNOSIS — R079 Chest pain, unspecified: Secondary | ICD-10-CM

## 2022-10-19 MED ORDER — AMLODIPINE BESYLATE 2.5 MG PO TABS: 2.5000 mg | ORAL_TABLET | Freq: Every day | ORAL | 1 refills | Status: DC

## 2022-10-21 ENCOUNTER — Other Ambulatory Visit (HOSPITAL_COMMUNITY): Payer: Self-pay | Admitting: *Deleted

## 2022-10-21 DIAGNOSIS — R072 Precordial pain: Secondary | ICD-10-CM

## 2022-10-21 DIAGNOSIS — R5383 Other fatigue: Secondary | ICD-10-CM

## 2022-10-21 DIAGNOSIS — E119 Type 2 diabetes mellitus without complications: Secondary | ICD-10-CM

## 2022-10-21 DIAGNOSIS — R079 Chest pain, unspecified: Secondary | ICD-10-CM

## 2022-10-21 DIAGNOSIS — E669 Obesity, unspecified: Secondary | ICD-10-CM

## 2022-10-21 NOTE — Telephone Encounter (Signed)
Pts Cardiac CT is scheduled for 11/04/22 at 0800.   Pt made aware of appt date and time by CT Scheduler.

## 2022-10-27 LAB — BASIC METABOLIC PANEL
BUN/Creatinine Ratio: 18 (ref 9–20)
BUN: 16 mg/dL (ref 6–24)
CO2: 22 mmol/L (ref 20–29)
Calcium: 9.4 mg/dL (ref 8.7–10.2)
Chloride: 103 mmol/L (ref 96–106)
Creatinine, Ser: 0.89 mg/dL (ref 0.76–1.27)
Glucose: 109 mg/dL — ABNORMAL HIGH (ref 70–99)
Potassium: 4.6 mmol/L (ref 3.5–5.2)
Sodium: 139 mmol/L (ref 134–144)
eGFR: 103 mL/min/{1.73_m2} (ref >59)

## 2022-11-03 ENCOUNTER — Telehealth (HOSPITAL_COMMUNITY): Payer: Self-pay | Admitting: *Deleted

## 2022-11-03 NOTE — Telephone Encounter (Signed)
Attempted to call patient regarding upcoming cardiac CT appointment. °Left message on voicemail with name and callback number ° °Mackinsey Pelland RN Navigator Cardiac Imaging °[facility] °[phone] Office °[phone] Cell ° °

## 2022-11-03 NOTE — Telephone Encounter (Signed)
Reaching out to patient to offer assistance regarding upcoming cardiac imaging study; pt verbalizes understanding of appt date/time, parking situation and where to check in, pre-test NPO status and medications ordered, and verified current allergies; name and call back number provided for further questions should they arise  Sonu Kruckenberg RN Navigator Cardiac Imaging [facility] [phone] office [phone] cell  Patient to take 100mg metoprolol tartrate two hours prior to his cardiac CT scan. He is aware to arrive at 7:30am. 

## 2022-11-04 ENCOUNTER — Ambulatory Visit (HOSPITAL_COMMUNITY)
Admission: RE | Admit: 2022-11-04 | Discharge: 2022-11-04 | Disposition: A | Discharge status: Home / Self Care | Payer: 59 | Source: Ambulatory Visit | Attending: Cardiology | Admitting: Cardiology

## 2022-11-04 DIAGNOSIS — E119 Type 2 diabetes mellitus without complications: Secondary | ICD-10-CM | POA: Diagnosis present

## 2022-11-04 DIAGNOSIS — R072 Precordial pain: Secondary | ICD-10-CM | POA: Insufficient documentation

## 2022-11-04 DIAGNOSIS — R079 Chest pain, unspecified: Secondary | ICD-10-CM | POA: Insufficient documentation

## 2022-11-04 DIAGNOSIS — I10 Essential (primary) hypertension: Secondary | ICD-10-CM | POA: Insufficient documentation

## 2022-11-04 DIAGNOSIS — E669 Obesity, unspecified: Secondary | ICD-10-CM | POA: Diagnosis present

## 2022-11-04 DIAGNOSIS — R5383 Other fatigue: Secondary | ICD-10-CM | POA: Diagnosis present

## 2022-11-04 MED ORDER — IOHEXOL 350 MG/ML SOLN
100.0000 mL | Freq: Once | INTRAVENOUS | Status: AC | PRN
  Administered 2022-11-04: 100 mL via INTRAVENOUS

## 2022-11-04 MED ORDER — METOPROLOL TARTRATE 5 MG/5ML IV SOLN
INTRAVENOUS | Status: AC
  Filled 2022-11-04: qty 10

## 2022-11-04 MED ORDER — NITROGLYCERIN 0.4 MG SL SUBL
SUBLINGUAL_TABLET | SUBLINGUAL | Status: AC
  Filled 2022-11-04: qty 2

## 2022-11-04 MED ORDER — METOPROLOL TARTRATE 5 MG/5ML IV SOLN
5.0000 mg | INTRAVENOUS | Status: DC | PRN
  Administered 2022-11-04 (×2): 5 mg via INTRAVENOUS

## 2022-11-04 MED ORDER — NITROGLYCERIN 0.4 MG SL SUBL
0.8000 mg | SUBLINGUAL_TABLET | Freq: Once | SUBLINGUAL | Status: AC
  Administered 2022-11-04: 0.8 mg via SUBLINGUAL

## 2022-11-04 NOTE — Progress Notes (Signed)
Patient heart rate mid to high 70's pre scan and down to 72-74 with breath hold. Given Metoprolol 5mg  IV pre scan. Having more PVC's, 1-2 per screen. After nitro heart rate increased to 80's with PVC's 1-2 per screen. BP post nitro 102/61. Talked to Dr Rennis Golden re above and he ordered another dose of Metoprolol 5mg  and then for patient to be scanned retrospectively. Patient feels fine post scan, SBP 109. Discharged walking.

## 2022-12-03 ENCOUNTER — Other Ambulatory Visit: Payer: Self-pay

## 2022-12-03 ENCOUNTER — Other Ambulatory Visit: Payer: Self-pay | Admitting: Internal Medicine

## 2022-12-11 ENCOUNTER — Other Ambulatory Visit: Payer: Self-pay | Admitting: Internal Medicine

## 2023-03-07 ENCOUNTER — Other Ambulatory Visit: Payer: Self-pay | Admitting: Internal Medicine

## 2023-03-08 ENCOUNTER — Other Ambulatory Visit: Payer: Self-pay

## 2023-03-10 ENCOUNTER — Telehealth: Payer: Self-pay | Admitting: Internal Medicine

## 2023-03-10 DIAGNOSIS — E538 Deficiency of other specified B group vitamins: Secondary | ICD-10-CM

## 2023-03-10 DIAGNOSIS — E559 Vitamin D deficiency, unspecified: Secondary | ICD-10-CM

## 2023-03-10 DIAGNOSIS — E7800 Pure hypercholesterolemia, unspecified: Secondary | ICD-10-CM

## 2023-03-10 DIAGNOSIS — R7303 Prediabetes: Secondary | ICD-10-CM

## 2023-03-10 DIAGNOSIS — Z125 Encounter for screening for malignant neoplasm of prostate: Secondary | ICD-10-CM

## 2023-03-10 NOTE — Telephone Encounter (Signed)
Ok labs are ordered

## 2023-03-10 NOTE — Telephone Encounter (Signed)
Patient is scheduled for physical on 03/24/23, and is asking if lab orders could be put in so he can come in prior to get labs done. Would like a call when lab orders have been placed so he knows when to come in. Best callback is [phone].

## 2023-03-11 NOTE — Telephone Encounter (Signed)
Called and let Pt know

## 2023-03-18 ENCOUNTER — Other Ambulatory Visit (INDEPENDENT_AMBULATORY_CARE_PROVIDER_SITE_OTHER): Payer: 59

## 2023-03-18 DIAGNOSIS — R7303 Prediabetes: Secondary | ICD-10-CM | POA: Diagnosis not present

## 2023-03-18 DIAGNOSIS — Z125 Encounter for screening for malignant neoplasm of prostate: Secondary | ICD-10-CM

## 2023-03-18 DIAGNOSIS — E7800 Pure hypercholesterolemia, unspecified: Secondary | ICD-10-CM

## 2023-03-18 DIAGNOSIS — E538 Deficiency of other specified B group vitamins: Secondary | ICD-10-CM | POA: Diagnosis not present

## 2023-03-18 DIAGNOSIS — E559 Vitamin D deficiency, unspecified: Secondary | ICD-10-CM | POA: Diagnosis not present

## 2023-03-18 LAB — URINALYSIS, ROUTINE W REFLEX MICROSCOPIC
Bilirubin Urine: NEGATIVE
Ketones, ur: NEGATIVE
Leukocytes,Ua: NEGATIVE
Nitrite: NEGATIVE
Specific Gravity, Urine: 1.025 (ref 1.000–1.030)
Total Protein, Urine: NEGATIVE
Urine Glucose: NEGATIVE
Urobilinogen, UA: 0.2 (ref 0.0–1.0)
WBC, UA: NONE SEEN (ref 0–?)
pH: 6 (ref 5.0–8.0)

## 2023-03-18 LAB — CBC WITH DIFFERENTIAL/PLATELET
Basophils Absolute: 0 10*3/uL (ref 0.0–0.1)
Basophils Relative: 0.6 % (ref 0.0–3.0)
Eosinophils Absolute: 0.1 10*3/uL (ref 0.0–0.7)
Eosinophils Relative: 1.1 % (ref 0.0–5.0)
HCT: 43.3 % (ref 39.0–52.0)
Hemoglobin: 14.2 g/dL (ref 13.0–17.0)
Lymphocytes Relative: 26.2 % (ref 12.0–46.0)
Lymphs Abs: 2 10*3/uL (ref 0.7–4.0)
MCHC: 32.8 g/dL (ref 30.0–36.0)
MCV: 89.2 fL (ref 78.0–100.0)
Monocytes Absolute: 0.7 10*3/uL (ref 0.1–1.0)
Monocytes Relative: 8.7 % (ref 3.0–12.0)
Neutro Abs: 4.9 10*3/uL (ref 1.4–7.7)
Neutrophils Relative %: 63.4 % (ref 43.0–77.0)
Platelets: 237 10*3/uL (ref 150.0–400.0)
RBC: 4.86 Mil/uL (ref 4.22–5.81)
RDW: 13.4 % (ref 11.5–15.5)
WBC: 7.7 10*3/uL (ref 4.0–10.5)

## 2023-03-18 LAB — BASIC METABOLIC PANEL
BUN: 14 mg/dL (ref 6–23)
CO2: 29 meq/L (ref 19–32)
Calcium: 9.8 mg/dL (ref 8.4–10.5)
Chloride: 103 meq/L (ref 96–112)
Creatinine, Ser: 0.78 mg/dL (ref 0.40–1.50)
GFR: 102.54 mL/min (ref >60.00)
Glucose, Bld: 101 mg/dL — ABNORMAL HIGH (ref 70–99)
Potassium: 4.2 meq/L (ref 3.5–5.1)
Sodium: 139 meq/L (ref 135–145)

## 2023-03-18 LAB — HEPATIC FUNCTION PANEL
ALT: 20 U/L (ref 0–53)
AST: 16 U/L (ref 0–37)
Albumin: 4.4 g/dL (ref 3.5–5.2)
Alkaline Phosphatase: 72 U/L (ref 39–117)
Bilirubin, Direct: 0.1 mg/dL (ref 0.0–0.3)
Total Bilirubin: 0.5 mg/dL (ref 0.2–1.2)
Total Protein: 7.3 g/dL (ref 6.0–8.3)

## 2023-03-18 LAB — HEMOGLOBIN A1C: Hgb A1c MFr Bld: 5.9 % (ref 4.6–6.5)

## 2023-03-18 LAB — MICROALBUMIN / CREATININE URINE RATIO
Creatinine,U: 101.6 mg/dL
Microalb Creat Ratio: 0.8 mg/g (ref 0.0–30.0)
Microalb, Ur: 0.8 mg/dL (ref 0.0–1.9)

## 2023-03-18 LAB — PSA: PSA: 0.33 ng/mL (ref 0.10–4.00)

## 2023-03-18 LAB — LIPID PANEL
Cholesterol: 150 mg/dL (ref 0–200)
HDL: 39.9 mg/dL (ref >39.00)
LDL Cholesterol: 86 mg/dL (ref 0–99)
NonHDL: 110.55
Total CHOL/HDL Ratio: 4
Triglycerides: 121 mg/dL (ref 0.0–149.0)
VLDL: 24.2 mg/dL (ref 0.0–40.0)

## 2023-03-18 LAB — VITAMIN D 25 HYDROXY (VIT D DEFICIENCY, FRACTURES): VITD: 54.85 ng/mL (ref 30.00–100.00)

## 2023-03-18 LAB — TSH: TSH: 0.96 u[IU]/mL (ref 0.35–5.50)

## 2023-03-18 LAB — VITAMIN B12: Vitamin B-12: 563 pg/mL (ref 211–911)

## 2023-03-19 ENCOUNTER — Other Ambulatory Visit: Payer: Self-pay | Admitting: Internal Medicine

## 2023-03-24 ENCOUNTER — Ambulatory Visit (INDEPENDENT_AMBULATORY_CARE_PROVIDER_SITE_OTHER): Payer: 59 | Admitting: Internal Medicine

## 2023-03-24 ENCOUNTER — Encounter: Payer: Self-pay | Admitting: Internal Medicine

## 2023-03-24 VITALS — BP 104/82 | HR 89 | Temp 98.1°F | Ht 75.0 in | Wt 276.8 lb

## 2023-03-24 DIAGNOSIS — F411 Generalized anxiety disorder: Secondary | ICD-10-CM

## 2023-03-24 DIAGNOSIS — M25561 Pain in right knee: Secondary | ICD-10-CM

## 2023-03-24 DIAGNOSIS — E7800 Pure hypercholesterolemia, unspecified: Secondary | ICD-10-CM

## 2023-03-24 DIAGNOSIS — R7303 Prediabetes: Secondary | ICD-10-CM

## 2023-03-24 DIAGNOSIS — I10 Essential (primary) hypertension: Secondary | ICD-10-CM | POA: Diagnosis not present

## 2023-03-24 DIAGNOSIS — Z0001 Encounter for general adult medical examination with abnormal findings: Secondary | ICD-10-CM

## 2023-03-24 DIAGNOSIS — E559 Vitamin D deficiency, unspecified: Secondary | ICD-10-CM

## 2023-03-24 MED ORDER — LOSARTAN POTASSIUM 50 MG PO TABS: 50.0000 mg | ORAL_TABLET | Freq: Every day | ORAL | 3 refills | Status: AC

## 2023-03-24 NOTE — Progress Notes (Signed)
Patient ID: Robert Lloyd, male   DOB: 09/03/1970, 52 y.o.   MRN: [id number]         Chief Complaint:: wellness exam and Annual Exam (Anxiety issues, knee issues )  , htn, dm       HPI:  Robert Lloyd is a 52 y.o. male here for wellness exam; decliens flu shot, o/w up to date                        Also seems have increased anxiety the first 2-3 days after mounjaro, less nausea than anxiety, but has lost siginificant wt so wants to stay on same dose. BP has been lower so stopped the amlodipine 2.5 mg and taking HALF the losartan now on 50 mg per day.  Also has ongoing mild to mod right knee pain dull and sharp intermittent, worse to walk, no swelling or giveaways or falls, and located laterally with some tender soreness.  Pt denies chest pain, increased sob or doe, wheezing, orthopnea, PND, increased LE swelling, palpitations, or syncope.   Pt denies polydipsia, polyuria, or new focal neuro s/s.    Pt denies fever, wt loss, night sweats, loss of appetite, or other constitutional symptoms     Wt Readings from Last 3 Encounters:  03/24/23 276 lb 12.8 oz (125.6 kg)  04/07/22 298 lb 12.8 oz (135.5 kg)  03/12/22 293 lb (132.9 kg)   BP Readings from Last 3 Encounters:  03/24/23 104/82  11/04/22 109/70  05/15/22 (!) 142/98   Immunization History  Administered Date(s) Administered   Influenza,inj,Quad PF,6+ Mos 03/09/2014, 05/24/2015, 03/17/2016, 03/15/2018, 03/06/2019, 02/20/2022   Moderna Sars-Covid-2 Vaccination 08/17/2019, 09/11/2019, 05/03/2020   Tdap 03/09/2014   Zoster Recombinant(Shingrix) 04/15/2021, 06/18/2021  There are no preventive care reminders to display for this patient.    Past Medical History:  Diagnosis Date   Anxiety state, unspecified 11/09/2012   DEPRESSION 03/31/2007   Qualifier: Diagnosis of  By: Jonny Ruiz MD, Len Blalock    HYPERLIPIDEMIA 03/31/2007   Qualifier: Diagnosis of  By: Jonny Ruiz MD, Len Blalock    HYPERTENSION 04/10/2008   Qualifier: Diagnosis of  By: Everardo All MD,  Sean A    Obesity    Past Surgical History:  Procedure Laterality Date   HERNIA REPAIR     infancy   LITHOTRIPSY     rotater cuff repair Right     reports that he has never smoked. He has never used smokeless tobacco. He reports current alcohol use. He reports that he does not use drugs. family history includes Colon polyps in his brother; Hypertension in his mother. Allergies  Allergen Reactions   Sulfonamide Derivatives     UNKNOWN reaction   Current Outpatient Medications on File Prior to Visit  Medication Sig Dispense Refill   Ascorbic Acid (VITAMIN C PO) Take by mouth.     Cholecalciferol (VITAMIN D3 PO) Take by mouth.     clonazePAM (KLONOPIN) 0.5 MG tablet TAKE 1 TABLET BY MOUTH TWICE A DAY AS NEEDED 60 tablet 1   rosuvastatin (CRESTOR) 20 MG tablet TAKE 1 TABLET BY MOUTH EVERY OTHER DAY 45 tablet 3   tirzepatide (MOUNJARO) 7.5 MG/0.5ML Pen Inject 7.5 mg into the skin once a week. 6 mL 11   zinc gluconate 50 MG tablet Take 50 mg by mouth daily.     zolpidem (AMBIEN CR) 12.5 MG CR tablet TAKE 1 TABLET (12.5 MG TOTAL) BY MOUTH AT BEDTIME AS NEEDED. FOR SLEEP 90 tablet  0   No current facility-administered medications on file prior to visit.        ROS:  All others reviewed and negative.  Objective        PE:  BP 104/82 (BP Location: Left Arm, Patient Position: Sitting, Cuff Size: Normal)   Pulse 89   Temp 98.1 F (36.7 C) (Oral)   Ht 6\' 3"  (1.905 m)   Wt 276 lb 12.8 oz (125.6 kg)   SpO2 98%   BMI 34.60 kg/m                 Constitutional: Pt appears in NAD               HENT: Head: NCAT.                Right Ear: External ear normal.                 Left Ear: External ear normal.                Eyes: . Pupils are equal, round, and reactive to light. Conjunctivae and EOM are normal               Nose: without d/c or deformity               Neck: Neck supple. Gross normal ROM               Cardiovascular: Normal rate and regular rhythm.                  Pulmonary/Chest: Effort normal and breath sounds without rales or wheezing.                Abd:  Soft, NT, ND, + BS, no organomegaly               Neurological: Pt is alert. At baseline orientation, motor grossly intact               Skin: Skin is warm. No rashes, no other new lesions, LE edema - none               Psychiatric: Pt behavior is normal without agitation   Micro: none  Cardiac tracings I have personally interpreted today:  none  Pertinent Radiological findings (summarize): none   Lab Results  Component Value Date   WBC 7.7 03/18/2023   HGB 14.2 03/18/2023   HCT 43.3 03/18/2023   PLT 237.0 03/18/2023   GLUCOSE 101 (H) 03/18/2023   CHOL 150 03/18/2023   TRIG 121.0 03/18/2023   HDL 39.90 03/18/2023   LDLDIRECT 160.0 03/15/2018   LDLCALC 86 03/18/2023   ALT 20 03/18/2023   AST 16 03/18/2023   NA 139 03/18/2023   K 4.2 03/18/2023   CL 103 03/18/2023   CREATININE 0.78 03/18/2023   BUN 14 03/18/2023   CO2 29 03/18/2023   TSH 0.96 03/18/2023   PSA 0.33 03/18/2023   HGBA1C 5.9 03/18/2023   MICROALBUR 0.8 03/18/2023   Assessment/Plan:  Robert Lloyd is a 52 y.o. White or Caucasian [1] male with  has a past medical history of Anxiety state, unspecified (11/09/2012), DEPRESSION (03/31/2007), HYPERLIPIDEMIA (03/31/2007), HYPERTENSION (04/10/2008), and Obesity.  Encounter for well adult exam with abnormal findings Age and sex appropriate education and counseling updated with regular exercise and diet Referrals for preventative services - none needed Immunizations addressed - declines flu shot Smoking counseling  - none needed Evidence for depression or other mood  disorder - none significant Most recent labs reviewed. I have personally reviewed and have noted: 1) the patient's medical and social history 2) The patient's current medications and supplements 3) The patient's height, weight, and BMI have been recorded in the chart   Hyperlipidemia Lab Results   Component Value Date   LDLCALC 86 03/18/2023   Uncontrolled, goal ldl < 70, pt to continue current statin crestor 20 mg every day, and lower chol diet as decines other change for now   Essential hypertension BP Readings from Last 3 Encounters:  03/24/23 104/82  11/04/22 109/70  05/15/22 (!) 142/98   Overcontrolled recently, persists after stopping amlodipine 2.5, now to reduce the losartan to 50 mg every day  Prediabetes Lab Results  Component Value Date   HGBA1C 5.9 03/18/2023   Stable, pt to continue current medical treatment mounjaro 7.5 mg weekly   Right knee pain Today exam c/w possible iliotibial tendonitis vs djd, pt encouraged to f/u with sport medicine  Anxiety state Chronic stable, cont klonopin bid prn  Followup: Return in about 6 months (around 09/22/2023).  Oliver Barre, MD 03/27/2023 3:22 PM [facility]

## 2023-03-24 NOTE — Patient Instructions (Signed)
Ok to cut back the losartan to 50 mg per day, and also cut back to 25 mg if you have persistent low blood pressures and dizziness or weakness  Please continue all other medications as before, and refills have been done if requested.  Please have the pharmacy call with any other refills you may need.  Please continue your efforts at being more active, low cholesterol diet, and weight control.  You are otherwise up to date with prevention measures today.  Please keep your appointments with your specialists as you may have planned  Plsease see Dr Denyse Amass for the right knee as you mentioned  Your lab work was overall very very good today  Please make an Appointment to return in 6 months, or sooner if needed, also with Lab Appointment for testing done 3-5 days before at the FIRST FLOOR Lab (so this is for TWO appointments - please see the scheduling desk as you leave)

## 2023-03-27 ENCOUNTER — Encounter: Payer: Self-pay | Admitting: Internal Medicine

## 2023-03-27 NOTE — Assessment & Plan Note (Signed)
Chronic stable, cont klonopin bid prn

## 2023-03-27 NOTE — Assessment & Plan Note (Signed)
BP Readings from Last 3 Encounters:  03/24/23 104/82  11/04/22 109/70  05/15/22 (!) 142/98   Overcontrolled recently, persists after stopping amlodipine 2.5, now to reduce the losartan to 50 mg every day

## 2023-03-27 NOTE — Assessment & Plan Note (Signed)
Lab Results  Component Value Date   LDLCALC 86 03/18/2023   Uncontrolled, goal ldl < 70, pt to continue current statin crestor 20 mg every day, and lower chol diet as decines other change for now

## 2023-03-27 NOTE — Assessment & Plan Note (Signed)
Age and sex appropriate education and counseling updated with regular exercise and diet Referrals for preventative services - none needed Immunizations addressed - declines flu shot Smoking counseling  - none needed Evidence for depression or other mood disorder - none significant Most recent labs reviewed. I have personally reviewed and have noted: 1) the patient's medical and social history 2) The patient's current medications and supplements 3) The patient's height, weight, and BMI have been recorded in the chart

## 2023-03-27 NOTE — Assessment & Plan Note (Signed)
Lab Results  Component Value Date   HGBA1C 5.9 03/18/2023   Stable, pt to continue current medical treatment mounjaro 7.5 mg weekly

## 2023-03-27 NOTE — Assessment & Plan Note (Signed)
Today exam c/w possible iliotibial tendonitis vs djd, pt encouraged to f/u with sport medicine

## 2023-03-30 ENCOUNTER — Other Ambulatory Visit (HOSPITAL_COMMUNITY): Payer: Self-pay

## 2023-03-30 ENCOUNTER — Telehealth: Payer: Self-pay

## 2023-03-30 NOTE — Telephone Encounter (Signed)
Pharmacy Patient Advocate Encounter   Received notification from CoverMyMeds that prior authorization for [facility] 2.5MG /0.5ML auto-injectors is required/requested.   Insurance verification completed.   The patient is insured through [employer] .   Per test claim: PA required; PA submitted to [facility] via CoverMyMeds Key/confirmation #/EOC [id number] Status is pending   Sponsored Renewals

## 2023-04-01 NOTE — Telephone Encounter (Signed)
Pharmacy Patient Advocate Encounter  Received notification from [facility] that Prior Authorization for [facility] 2.5MG /0.5ML auto-injectors has been APPROVED from 03/30/23 to 03/29/24   PA #/Case ID/Reference #: [id number]

## 2023-04-02 ENCOUNTER — Ambulatory Visit (INDEPENDENT_AMBULATORY_CARE_PROVIDER_SITE_OTHER): Payer: 59

## 2023-04-02 ENCOUNTER — Encounter: Payer: Self-pay | Admitting: Nurse Practitioner

## 2023-04-02 ENCOUNTER — Ambulatory Visit: Payer: 59 | Admitting: Family Medicine

## 2023-04-02 ENCOUNTER — Ambulatory Visit: Payer: 59 | Admitting: Nurse Practitioner

## 2023-04-02 VITALS — BP 122/82 | HR 55 | Temp 98.3°F | Ht 75.0 in | Wt 277.4 lb

## 2023-04-02 DIAGNOSIS — R059 Cough, unspecified: Secondary | ICD-10-CM

## 2023-04-02 DIAGNOSIS — J029 Acute pharyngitis, unspecified: Secondary | ICD-10-CM | POA: Diagnosis not present

## 2023-04-02 DIAGNOSIS — Z113 Encounter for screening for infections with a predominantly sexual mode of transmission: Secondary | ICD-10-CM | POA: Diagnosis not present

## 2023-04-02 LAB — POCT RAPID STREP A (OFFICE): Rapid Strep A Screen: NEGATIVE

## 2023-04-02 MED ORDER — AZITHROMYCIN 250 MG PO TABS
ORAL_TABLET | ORAL | 0 refills | Status: AC
Start: 2023-04-02 — End: 2023-04-07

## 2023-04-02 NOTE — Assessment & Plan Note (Signed)
Patient reports new sexual contact, would like to have throat swabbed for gonorrhea and chlamydia as well as urine testing to screen for STI. Swab/urine ordered, further recommendations may be made based upon results.

## 2023-04-02 NOTE — Assessment & Plan Note (Signed)
Acute,  Concern for pneumonia Treat with Z-pak and order stat chest xray. May consider addition of Augmentin pending chest xray results.

## 2023-04-02 NOTE — Assessment & Plan Note (Addendum)
Acute, VSS Patient reports new sexual contact, would like to have throat swabbed for gonorrhea and chlamydia as well as urine testing to screen for STI. Swab/urine ordered, further recommendations may be made based upon results.  POC strep negative, but symptoms have been present for >3 weeks. Treat empirically with Z-pak. Will also get chest xray for concern of pneumonia. May add Augmentin, pending chest xray results.

## 2023-04-02 NOTE — Progress Notes (Signed)
Established Patient Office Visit  Subjective   Patient ID: Robert Lloyd, male    DOB: Oct 28, 1970  Age: 52 y.o. MRN: [id number]  Chief Complaint  Patient presents with   Sore Throat    Sore throat x2-3 weeks on right side of throat. Notes of issues dysphagia. At worst points notes of right ear pain. Covid- as recently as 10/17   Estimated Creatinine Clearance: 154.3 mL/min (by C-G formula based on SCr of 0.78 mg/dL).  Patient arrives for acute visit for the above.  Reports sore throat for about 3 weeks.  Reports he may have had chills 1 evening but is not positive.  Also has nasal congestion, productive cough, and headache.  Reports he has tested for COVID-19 infection at home x 2 both of which were negative, last test was yesterday.  When asked, about new sexual partners he denies this but would like to have throat swab for chlamydia and gonorrhea.    Review of Systems  Constitutional:  Negative for chills and fever.  HENT:  Positive for congestion and sore throat.   Respiratory:  Positive for cough and sputum production. Negative for shortness of breath and wheezing.   Cardiovascular:  Negative for chest pain.  Neurological:  Positive for headaches.      Objective:     BP 122/82   Pulse (!) 55   Temp 98.3 F (36.8 C) (Oral)   Ht 6\' 3"  (1.905 m)   Wt 277 lb 6.4 oz (125.8 kg)   SpO2 99%   BMI 34.67 kg/m    Physical Exam Vitals reviewed.  Constitutional:      Appearance: Normal appearance.  HENT:     Head: Normocephalic and atraumatic.     Mouth/Throat:     Pharynx: Posterior oropharyngeal erythema present. No pharyngeal swelling or oropharyngeal exudate.  Cardiovascular:     Rate and Rhythm: Normal rate and regular rhythm.  Pulmonary:     Effort: Pulmonary effort is normal.     Breath sounds: Examination of the right-middle field reveals rales. Rales present.  Musculoskeletal:     Cervical back: Neck supple.  Lymphadenopathy:     Cervical: No cervical  adenopathy.  Skin:    General: Skin is warm and dry.  Neurological:     Mental Status: He is alert and oriented to person, place, and time.  Psychiatric:        Mood and Affect: Mood normal.        Behavior: Behavior normal.        Thought Content: Thought content normal.        Judgment: Judgment normal.      Results for orders placed or performed in visit on 04/02/23  POCT rapid strep A  Result Value Ref Range   Rapid Strep A Screen Negative Negative      The 10-year ASCVD risk score (Arnett DK, et al., 2019) is: 7.4%    Assessment & Plan:   Problem List Items Addressed This Visit       Respiratory   Pharyngitis - Primary    Acute, VSS Patient reports new sexual contact, would like to have throat swabbed for gonorrhea and chlamydia as well as urine testing to screen for STI. Swab/urine ordered, further recommendations may be made based upon results.  POC strep negative, but symptoms have been present for >3 weeks. Treat empirically with Z-pak. Will also get chest xray for concern of pneumonia. May add Augmentin, pending chest xray results.  Relevant Medications   azithromycin (ZITHROMAX) 250 MG tablet   Other Relevant Orders   Ct/GC NAA, Pharyngeal   DG Chest 2 View   POCT rapid strep A (Completed)     Other   Cough    Acute,  Concern for pneumonia Treat with Z-pak and order stat chest xray. May consider addition of Augmentin pending chest xray results.      Relevant Medications   azithromycin (ZITHROMAX) 250 MG tablet   Other Relevant Orders   DG Chest 2 View   POCT rapid strep A (Completed)   Routine screening for STI (sexually transmitted infection)    Patient reports new sexual contact, would like to have throat swabbed for gonorrhea and chlamydia as well as urine testing to screen for STI. Swab/urine ordered, further recommendations may be made based upon results.       Relevant Orders   Chlamydia/Neisseria Gonorrhoeae RNA,TMA,Urogenital     Return if symptoms worsen or fail to improve.    Elenore Paddy, NP

## 2023-04-05 LAB — CHLAMYDIA/NEISSERIA GONORRHOEAE RNA,TMA,UROGENTIAL
C. trachomatis RNA, TMA: NOT DETECTED
N. gonorrhoeae RNA, TMA: NOT DETECTED

## 2023-04-06 LAB — CT/GC NAA, PHARYNGEAL
C TRACH RRNA NPH QL PCR: NEGATIVE
N GONORRHOEA RRNA NPH QL PCR: NEGATIVE

## 2023-04-29 ENCOUNTER — Ambulatory Visit: Payer: 59 | Admitting: Family Medicine

## 2023-05-04 NOTE — Progress Notes (Unsigned)
   Rubin Payor, PhD, LAT, ATC acting as a scribe for Clementeen Graham, MD.  Robert Lloyd is a 52 y.o. male who presents to [facility] at [facility] today for R knee pain. Pt was previously seen by Dr. Denyse Amass on 02/19/22 for thoracic back pain.  Today, pt c/o R knee pain x ***. Pt locates pain to ***  R Knee swelling: Mechanical symptoms: Aggravates: Treatments tried:  Dx imaging: 10/29/14 R knee MRI  10/19/14 R knee XR  Pertinent review of systems: ***  Relevant historical information: ***   Exam:  There were no vitals taken for this visit. General: Well Developed, well nourished, and in no acute distress.   MSK: ***    Lab and Radiology Results No results found for this or any previous visit (from the past 72 hour(s)). No results found.     Assessment and Plan: 52 y.o. male with ***   PDMP not reviewed this encounter. No orders of the defined types were placed in this encounter.  No orders of the defined types were placed in this encounter.    Discussed warning signs or symptoms. Please see discharge instructions. Patient expresses understanding.   ***

## 2023-05-05 ENCOUNTER — Ambulatory Visit (INDEPENDENT_AMBULATORY_CARE_PROVIDER_SITE_OTHER): Payer: 59

## 2023-05-05 ENCOUNTER — Ambulatory Visit: Payer: 59 | Admitting: Family Medicine

## 2023-05-05 ENCOUNTER — Other Ambulatory Visit: Payer: Self-pay

## 2023-05-05 VITALS — BP 130/90 | HR 81 | Ht 75.0 in | Wt 280.0 lb

## 2023-05-05 DIAGNOSIS — M25561 Pain in right knee: Secondary | ICD-10-CM

## 2023-05-05 DIAGNOSIS — M25361 Other instability, right knee: Secondary | ICD-10-CM

## 2023-05-05 DIAGNOSIS — G8929 Other chronic pain: Secondary | ICD-10-CM

## 2023-05-05 NOTE — Patient Instructions (Signed)
Thank you for coming in today.   Please get an Xray today before you leave   I've referred you to Physical Therapy.  Let us know if you don't hear from them in one week.   Please use Voltaren gel (Generic Diclofenac Gel) up to 4x daily for pain as needed.  This is available over-the-counter as both the name brand Voltaren gel and the generic diclofenac gel.   Check back in 1 month

## 2023-05-19 ENCOUNTER — Other Ambulatory Visit: Payer: Self-pay | Admitting: Internal Medicine

## 2023-05-24 NOTE — Progress Notes (Signed)
Right knee x-ray shows a little bit of arthritis underneath the kneecap.

## 2023-06-02 ENCOUNTER — Ambulatory Visit: Payer: 59 | Admitting: Family Medicine

## 2023-06-23 ENCOUNTER — Other Ambulatory Visit: Payer: Self-pay | Admitting: Emergency Medicine

## 2023-06-23 NOTE — Telephone Encounter (Signed)
Your patient I believe.

## 2023-07-20 NOTE — Progress Notes (Signed)
 This encounter was created in error - please disregard.

## 2023-07-22 ENCOUNTER — Other Ambulatory Visit: Payer: Self-pay | Admitting: Internal Medicine

## 2023-07-25 ENCOUNTER — Emergency Department (HOSPITAL_COMMUNITY): Payer: 59

## 2023-07-25 ENCOUNTER — Emergency Department (HOSPITAL_COMMUNITY)
Admission: EM | Admit: 2023-07-25 | Discharge: 2023-07-25 | Disposition: A | Discharge status: Home / Self Care | Payer: 59 | Attending: Emergency Medicine | Admitting: Emergency Medicine

## 2023-07-25 ENCOUNTER — Other Ambulatory Visit: Payer: Self-pay

## 2023-07-25 ENCOUNTER — Encounter (HOSPITAL_COMMUNITY): Payer: Self-pay | Admitting: Emergency Medicine

## 2023-07-25 DIAGNOSIS — W19XXXA Unspecified fall, initial encounter: Secondary | ICD-10-CM | POA: Diagnosis not present

## 2023-07-25 DIAGNOSIS — S2232XA Fracture of one rib, left side, initial encounter for closed fracture: Secondary | ICD-10-CM | POA: Insufficient documentation

## 2023-07-25 DIAGNOSIS — Y9361 Activity, american tackle football: Secondary | ICD-10-CM | POA: Diagnosis not present

## 2023-07-25 DIAGNOSIS — S299XXA Unspecified injury of thorax, initial encounter: Secondary | ICD-10-CM | POA: Diagnosis present

## 2023-07-25 LAB — CBC
HCT: 43.2 % (ref 39.0–52.0)
Hemoglobin: 14.6 g/dL (ref 13.0–17.0)
MCH: 29.7 pg (ref 26.0–34.0)
MCHC: 33.8 g/dL (ref 30.0–36.0)
MCV: 87.8 fL (ref 80.0–100.0)
Platelets: 232 10*3/uL (ref 150–400)
RBC: 4.92 MIL/uL (ref 4.22–5.81)
RDW: 12.7 % (ref 11.5–15.5)
WBC: 8.7 10*3/uL (ref 4.0–10.5)
nRBC: 0 % (ref 0.0–0.2)

## 2023-07-25 LAB — BASIC METABOLIC PANEL
Anion gap: 14 (ref 5–15)
BUN: 18 mg/dL (ref 6–20)
CO2: 21 mmol/L — ABNORMAL LOW (ref 22–32)
Calcium: 9.6 mg/dL (ref 8.9–10.3)
Chloride: 102 mmol/L (ref 98–111)
Creatinine, Ser: 0.92 mg/dL (ref 0.61–1.24)
GFR, Estimated: >60 mL/min (ref >60)
Glucose, Bld: 122 mg/dL — ABNORMAL HIGH (ref 70–99)
Potassium: 3.5 mmol/L (ref 3.5–5.1)
Sodium: 137 mmol/L (ref 135–145)

## 2023-07-25 MED ORDER — OXYCODONE HCL 5 MG PO TABS: 5.0000 mg | ORAL_TABLET | Freq: Four times a day (QID) | ORAL | 0 refills | Status: DC | PRN

## 2023-07-25 MED ORDER — ONDANSETRON HCL 4 MG/2ML IJ SOLN
4.0000 mg | Freq: Once | INTRAMUSCULAR | Status: AC
  Administered 2023-07-25: 4 mg via INTRAVENOUS
  Filled 2023-07-25: qty 2

## 2023-07-25 MED ORDER — KETOROLAC TROMETHAMINE 30 MG/ML IJ SOLN
15.0000 mg | Freq: Once | INTRAMUSCULAR | Status: AC
  Administered 2023-07-25: 15 mg via INTRAVENOUS
  Filled 2023-07-25: qty 1

## 2023-07-25 MED ORDER — MORPHINE SULFATE (PF) 4 MG/ML IV SOLN
4.0000 mg | Freq: Once | INTRAVENOUS | Status: AC
  Administered 2023-07-25: 4 mg via INTRAVENOUS
  Filled 2023-07-25: qty 1

## 2023-07-25 NOTE — Discharge Instructions (Addendum)
 You have been evaluated for your symptoms.  You have suffered 1 broken rib, the seventh rib on the left side.  Please follow instruction below for care.

## 2023-07-25 NOTE — ED Provider Notes (Signed)
 Received signout from previous provider, please see her Note for complete H&P.  53 year old male who injured his left chest wall while playing football and he fell to the ground.  X-ray of wrist left ribs obtained independent viewed interpreted by me demonstrate a nondisplaced left anterolateral seventh rib fracture.  This is a closed injury.  Labs overall reassuring.  Patient provided with definitive rib fracture care which includes pain medication, incentive spirometer, and appropriate ways to brace his chest when coughing or laughing.  No hypoxia and patient felt better after receiving treatment.  He is stable for discharge.  BP 118/71   Pulse 76   Temp (!) 97.3 F (36.3 C) (Oral)   Resp (!) 24   Ht 6' 3 (1.905 m)   Wt 127 kg   SpO2 100%   BMI 35.00 kg/m   Results for orders placed or performed during the hospital encounter of 07/25/23  Basic metabolic panel   Collection Time: 07/25/23  6:27 PM  Result Value Ref Range   Sodium 137 135 - 145 mmol/L   Potassium 3.5 3.5 - 5.1 mmol/L   Chloride 102 98 - 111 mmol/L   CO2 21 (L) 22 - 32 mmol/L   Glucose, Bld 122 (H) 70 - 99 mg/dL   BUN 18 6 - 20 mg/dL   Creatinine, Ser 9.07 0.61 - 1.24 mg/dL   Calcium  9.6 8.9 - 10.3 mg/dL   GFR, Estimated >39 >39 mL/min   Anion gap 14 5 - 15  CBC   Collection Time: 07/25/23  6:27 PM  Result Value Ref Range   WBC 8.7 4.0 - 10.5 K/uL   RBC 4.92 4.22 - 5.81 MIL/uL   Hemoglobin 14.6 13.0 - 17.0 g/dL   HCT 56.7 60.9 - 47.9 %   MCV 87.8 80.0 - 100.0 fL   MCH 29.7 26.0 - 34.0 pg   MCHC 33.8 30.0 - 36.0 g/dL   RDW 87.2 88.4 - 84.4 %   Platelets 232 150 - 400 K/uL   nRBC 0.0 0.0 - 0.2 %   DG Ribs Unilateral W/Chest Left Result Date: 07/25/2023 CLINICAL DATA:  Mid to upper rib pain, fell, football injury EXAM: LEFT RIBS AND CHEST - 3+ VIEW COMPARISON:  04/02/2023 FINDINGS: Frontal view of the chest as well as frontal and oblique views of the left thoracic cage are obtained. Cardiac silhouette is  unremarkable. No airspace disease, effusion, or pneumothorax. There is a nondisplaced left anterolateral seventh rib fracture. No other acute bony abnormalities. IMPRESSION: 1. Nondisplaced left anterolateral seventh rib fracture. 2. Otherwise no acute intrathoracic process. Electronically Signed   By: Ozell Daring M.D.   On: 07/25/2023 19:53      Nivia Colon, PA-C 07/25/23 2034    Elnor Jayson LABOR, DO 07/27/23 [id number]

## 2023-07-25 NOTE — ED Triage Notes (Signed)
 Pt states he was playing backyard football and fell. Endorses pain to left side of ribs. Denies LOC or blood thinners.

## 2023-07-25 NOTE — ED Provider Notes (Signed)
 [place] [facility] Provider Note   CSN: [id number] Arrival date & time: 07/25/23  1746     History  Chief Complaint  Patient presents with   Robert Lloyd is a 53 y.o. male presenting for evaluation of left chest wall pain after falling prior to arrival.  He was playing backyard football when he fell landing on his left side with his arm tucked under him.  He endorses a knot that came up at his left lateral chest wall which has since improved but he continues to have significant pain which is worse with deep inspiration.  He denies head injury, also denies any extremity pain or injury.  He has had no treatment prior to arrival.  No palpitations, headache, dizziness.  He denies shortness of breath but has pain when he takes a deep breath.  States he felt a popping sensation when he fell.  He does endorse feeling some tingling in his face fingers and toes, he is significantly hyperventilating upon arrival.  The history is provided by the patient.       Home Medications Prior to Admission medications   Medication Sig Start Date End Date Taking? Authorizing Provider  Ascorbic Acid (VITAMIN C PO) Take by mouth.    [provider]  Cholecalciferol (VITAMIN D3 PO) Take by mouth.    [provider]  clonazePAM  (KLONOPIN ) 0.5 MG tablet TAKE 1 TABLET BY MOUTH TWICE A DAY AS NEEDED 06/23/23   Norleen Lynwood ORN, MD  losartan  (COZAAR ) 50 MG tablet Take 1 tablet (50 mg total) by mouth daily. 03/24/23   Norleen Lynwood ORN, MD  rosuvastatin  (CRESTOR ) 20 MG tablet TAKE 1 TABLET BY MOUTH EVERY OTHER DAY 12/03/22   Norleen Lynwood ORN, MD  tirzepatide  (MOUNJARO ) 7.5 MG/0.5ML Pen Inject 7.5 mg into the skin once a week. 09/01/22   Norleen Lynwood ORN, MD  zinc gluconate 50 MG tablet Take 50 mg by mouth daily.    [provider]  zolpidem  (AMBIEN  CR) 12.5 MG CR tablet TAKE 1 TABLET (12.5 MG TOTAL) BY MOUTH AT BEDTIME AS NEEDED. FOR SLEEP 07/22/23   Norleen Lynwood ORN, MD      Allergies    Sulfonamide derivatives    Review of Systems   Review of Systems  Constitutional:  Negative for fever.  HENT:  Negative for congestion and sore throat.   Eyes: Negative.   Respiratory:  Negative for chest tightness and shortness of breath.   Cardiovascular:  Positive for chest pain.  Gastrointestinal:  Negative for abdominal pain, nausea and vomiting.  Genitourinary: Negative.   Musculoskeletal:  Negative for arthralgias, joint swelling and neck pain.  Skin: Negative.  Negative for rash and wound.  Neurological:  Negative for dizziness, weakness, light-headedness, numbness and headaches.  Psychiatric/Behavioral: Negative.    All other systems reviewed and are negative.   Physical Exam Updated Vital Signs BP 118/71   Pulse 76   Temp (!) 97.3 F (36.3 C) (Oral)   Resp (!) 24   Ht 6' 3 (1.905 m)   Wt 127 kg   SpO2 100%   BMI 35.00 kg/m  Physical Exam Vitals and nursing note reviewed.  Constitutional:      Appearance: He is well-developed.  HENT:     Head: Normocephalic and atraumatic.  Eyes:     Conjunctiva/sclera: Conjunctivae normal.  Cardiovascular:     Rate and Rhythm: Normal rate and regular rhythm.  Heart sounds: Normal heart sounds.  Pulmonary:     Breath sounds: Normal breath sounds. No wheezing.     Comments: Reduced effort,  obvious discomfort with attempts at deep inspiration. No palpable rib cage deformity, no bruising. Abdominal:     General: Abdomen is protuberant. Bowel sounds are normal.     Palpations: Abdomen is soft.     Tenderness: There is no abdominal tenderness. There is no guarding or rebound.  Musculoskeletal:        General: Normal range of motion.     Cervical back: Normal range of motion.  Skin:    General: Skin is warm and dry.  Neurological:     Mental Status: He is alert.     ED Results / Procedures / Treatments   Labs (all labs ordered are listed, but only abnormal results are  displayed) Labs Reviewed  BASIC METABOLIC PANEL - Abnormal; Notable for the following components:      Result Value   CO2 21 (*)    Glucose, Bld 122 (*)    All other components within normal limits  CBC    EKG None   ED ECG REPORT   Date: 07/25/2023  Rate: 69  Rhythm: normal sinus rhythm  QRS Axis: normal  Intervals: normal  ST/T Wave abnormalities: normal  Conduction Disutrbances:none  Narrative Interpretation:   Old EKG Reviewed: unchanged  I have personally reviewed the EKG tracing and agree with the computerized printout as noted.  .  Radiology No results found.  Procedures Procedures    Medications Ordered in ED Medications  ondansetron  (ZOFRAN ) injection 4 mg (4 mg Intravenous Given 07/25/23 1817)  morphine  (PF) 4 MG/ML injection 4 mg (4 mg Intravenous Given 07/25/23 1816)  ketorolac  (TORADOL ) 30 MG/ML injection 15 mg (15 mg Intravenous Given 07/25/23 1826)    ED Course/ Medical Decision Making/ A&P                                 Medical Decision Making Pt presenting with left chest wall injury after fall sustained during football game at home. Pending xray to access for rib fractures.  Anticipate dc home with pain control and incentive spirometer.  Pt care assumed by Lonni Camp, PA-C.  Amount and/or Complexity of Data Reviewed Labs: ordered. Radiology: ordered.  Risk Prescription drug management.           Final Clinical Impression(s) / ED Diagnoses Final diagnoses:  None    Rx / DC Orders ED Discharge Orders     None         Birdena Mliss RIGGERS 07/25/23 1908    Elnor Jayson LABOR, DO 07/27/23 [phone]

## 2023-07-28 ENCOUNTER — Ambulatory Visit: Payer: 59 | Admitting: Emergency Medicine

## 2023-07-28 ENCOUNTER — Encounter: Payer: Self-pay | Admitting: Emergency Medicine

## 2023-07-28 VITALS — BP 130/90 | HR 85 | Temp 98.0°F | Ht 75.0 in | Wt 286.0 lb

## 2023-07-28 DIAGNOSIS — S2232XA Fracture of one rib, left side, initial encounter for closed fracture: Secondary | ICD-10-CM | POA: Diagnosis not present

## 2023-07-28 DIAGNOSIS — I10 Essential (primary) hypertension: Secondary | ICD-10-CM

## 2023-07-28 MED ORDER — TIRZEPATIDE 5 MG/0.5ML ~~LOC~~ SOAJ: 5.0000 mg | SUBCUTANEOUS | 3 refills | Status: AC

## 2023-07-28 NOTE — Progress Notes (Signed)
Robert Lloyd 53 y.o.   Chief Complaint  Patient presents with   Hospitalization Follow-up    Patient fell while playing football and broke a rib. Patient states his pain is the same when he left the ED. Pt mentioned his EKG in the ED was abnormal and wanted to talk about that as well. Patient wanted to know if he can go back down a dose in the mounjaro he is having too many side effects    HISTORY OF PRESENT ILLNESS: Acute problem visit today. This is a 53 y.o. male here for follow-up of emergency department visit on 07/25/2023 when he presented after a fall and broke a rib. X-ray shows nondisplaced left anterolateral seventh rib fracture They did an EKG which was read as borderline abnormal.  My interpretation of it is normal. Taking hydrocodone for pain which helps Needs work note Still has question about Mounjaro.  Higher dose giving him side effects.  Wants to go back down to 5 mg per week. No other complaints or medical concerns today.   HPI   Prior to Admission medications   Medication Sig Start Date End Date Taking? Authorizing Provider  Ascorbic Acid (VITAMIN C PO) Take by mouth.   Yes [provider]  Cholecalciferol (VITAMIN D3 PO) Take by mouth.   Yes [provider]  clonazePAM (KLONOPIN) 0.5 MG tablet TAKE 1 TABLET BY MOUTH TWICE A DAY AS NEEDED 06/23/23  Yes Corwin Levins, MD  losartan (COZAAR) 50 MG tablet Take 1 tablet (50 mg total) by mouth daily. 03/24/23  Yes Corwin Levins, MD  oxyCODONE (ROXICODONE) 5 MG immediate release tablet Take 1 tablet (5 mg total) by mouth every 6 (six) hours as needed for severe pain (pain score 7-10). 07/25/23  Yes Fayrene Helper, PA-C  rosuvastatin (CRESTOR) 20 MG tablet TAKE 1 TABLET BY MOUTH EVERY OTHER DAY 12/03/22  Yes Corwin Levins, MD  tirzepatide [facility]) 7.5 MG/0.5ML Pen Inject 7.5 mg into the skin once a week. 09/01/22  Yes Corwin Levins, MD  zinc gluconate 50 MG tablet Take 50 mg by mouth daily.   Yes [provider]  zolpidem (AMBIEN CR) 12.5 MG CR tablet TAKE 1 TABLET (12.5 MG TOTAL) BY MOUTH AT BEDTIME AS NEEDED. FOR SLEEP 07/22/23  Yes Corwin Levins, MD    Allergies  Allergen Reactions   Sulfonamide Derivatives     UNKNOWN reaction    Patient Active Problem List   Diagnosis Date Noted   Routine screening for STI (sexually transmitted infection) 04/02/2023   COVID-19 05/15/2022   Rapid palpitations 03/12/2022   Chest pain 02/23/2022   Fatigue 12/12/2021   Right lumbar radiculopathy 12/04/2020   Right flank pain 12/04/2020   Bursitis of left hip 12/04/2020   Wheezing 11/19/2020   Left lateral epicondylitis 01/12/2020   Right shoulder pain 01/12/2020   Left elbow pain 12/29/2019   Left shoulder pain 12/29/2019   Neck pain on left side 12/29/2019   Viral illness 09/29/2018   RUQ pain 03/22/2016   Erectile dysfunction 08/26/2015   Seasonal affective disorder (HCC) 05/24/2015   Morbid obesity (HCC) 05/24/2015   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   Right knee pain 10/19/2014   Prediabetes 03/09/2014   Encounter for well adult exam with abnormal findings 03/09/2014   Anxiety state 11/09/2012   PAIN IN JOINT OTHER SPECIFIED SITES 05/23/2010   Left flank pain 09/10/2009   KNEE PAIN, LEFT, CHRONIC 02/19/2009   Cough 06/13/2008   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   PULMONARY NODULE 05/09/2008   Overweight(278.02) 04/17/2008   Essential hypertension 04/10/2008   Headache(784.0) 04/10/2008   Hyperlipidemia 03/31/2007   Depression 03/31/2007   DSORD CIRCADIAN RHY SHFT WRK SLEEP PHASE TYPE 03/31/2007   FATIGUE 03/31/2007   INSOMNIA, HX OF 03/31/2007    Past Medical History:  Diagnosis Date   Anxiety state, unspecified 11/09/2012   DEPRESSION 03/31/2007   Qualifier: Diagnosis of  By: Jonny Ruiz MD, Len Blalock    HYPERLIPIDEMIA 03/31/2007   Qualifier: Diagnosis of  By: Jonny Ruiz MD, Len Blalock    HYPERTENSION 04/10/2008   Qualifier: Diagnosis of  By: Everardo All MD, Sean A    Obesity      Past Surgical History:  Procedure Laterality Date   HERNIA REPAIR     infancy   LITHOTRIPSY     rotater cuff repair Right     Social History   Socioeconomic History   Marital status: Married    Spouse name: n/a   Number of children: 0   Years of education: 12+   Highest education level: 12th grade  Occupational History   Occupation: Research scientist (medical)    Comment: [employer]  Tobacco Use   Smoking status: Never   Smokeless tobacco: Never  Vaping Use   Vaping status: Never Used  Substance and Sexual Activity   Alcohol use: Yes    Alcohol/week: 0.0 standard drinks of alcohol    Comment: occasionally   Drug use: No   Sexual activity: Not on file  Other Topics Concern   Not on file  Social History Narrative   Lives alone. Twin brother lives next door with his wife and son.   Social Drivers of Health   Financial Resource Strain: Patient Declined (03/23/2023)   Overall Financial Resource Strain (CARDIA)    Difficulty of Paying Living Expenses: Patient declined  Food Insecurity: Patient Declined (03/23/2023)   Hunger Vital Sign    Worried About Running Out of Food in the Last Year: Patient declined    Ran Out of Food in the Last Year: Patient declined  Transportation Needs: No Transportation Needs (03/23/2023)   PRAPARE - Administrator, Civil Service (Medical): No    Lack of Transportation (Non-Medical): No  Physical Activity: Insufficiently Active (03/23/2023)   Exercise Vital Sign    Days of Exercise per Week: 3 days    Minutes of Exercise per Session: 20 min  Stress: No Stress Concern Present (03/23/2023)   [employer] of Occupational Health - Occupational Stress Questionnaire    Feeling of Stress : Only a little  Social Connections: Moderately Integrated (03/23/2023)   Social Connection and Isolation Panel [NHANES]    Frequency of Communication with Friends and Family: Three times a week    Frequency of Social Gatherings with Friends and  Family: Patient declined    Attends Religious Services: More than 4 times per year    Active Member of [employer] or Organizations: No    Attends Engineer, structural: Not on file    Marital Status: Married  Catering manager Violence: Not on file    Family History  Problem Relation Age of Onset   Hypertension Mother    Colon polyps Brother    Colon cancer Neg Hx    Esophageal cancer Neg Hx    Rectal cancer Neg Hx    Stomach cancer Neg Hx      Review of Systems  Constitutional: Negative.  Negative for chills and fever.  HENT: Negative.  Negative for congestion.   Respiratory: Negative.  Negative for cough and shortness of breath.   Cardiovascular: Negative.  Negative for chest pain and palpitations.  Gastrointestinal:  Negative for abdominal pain, diarrhea, nausea and vomiting.  Genitourinary: Negative.  Negative for dysuria and hematuria.  Skin: Negative.  Negative for rash.  Neurological: Negative.  Negative for dizziness and headaches.  All other systems reviewed and are negative.   Vitals:   07/28/23 0819  BP: (!) 130/90  Pulse: 85  Temp: 98 F (36.7 C)  SpO2: 95%    Physical Exam Vitals reviewed.  Constitutional:      Appearance: Normal appearance.  HENT:     Head: Normocephalic.  Eyes:     Extraocular Movements: Extraocular movements intact.  Cardiovascular:     Rate and Rhythm: Normal rate and regular rhythm.     Pulses: Normal pulses.     Heart sounds: Normal heart sounds.  Pulmonary:     Effort: Pulmonary effort is normal.     Breath sounds: Normal breath sounds.  Abdominal:     Palpations: Abdomen is soft.     Tenderness: There is no abdominal tenderness.  Musculoskeletal:     Cervical back: No tenderness.  Skin:    General: Skin is warm and dry.  Neurological:     Mental Status: He is alert and oriented to person, place, and time.  Psychiatric:        Mood and Affect: Mood normal.        Behavior: Behavior normal.      ASSESSMENT &  PLAN: A total of 42 minutes was spent with the patient and counseling/coordination of care regarding preparing for this visit, review of most recent office visit notes, review of most recent emergency department visit notes, review of most recent chest x-ray report, pain management, review of multiple chronic medical conditions and their management, review of all medications, review of most recent bloodwork results, review of health maintenance items, education on nutrition, prognosis, documentation, and need for follow up.   Problem List Items Addressed This Visit       Cardiovascular and Mediastinum   Essential hypertension   BP Readings from Last 3 Encounters:  07/28/23 (!) 130/90  07/25/23 107/80  05/05/23 (!) 130/90  Pain contributing to elevated reading Continue losartan 50 mg daily and follow-up with PCP         Musculoskeletal and Integument   Closed fracture of one rib of left side - Primary   Nondisplaced fracture.  No complications. Pain management discussed. Using Norco at present time which is helping Needs work note. Work note provided as requested. Advised to follow-up with PCP in 2 to 3 weeks        Other   Morbid obesity (HCC)   Was started on Mounjaro but not tolerating 7.5 mg dose due to side effects.  Was doing well on 5 mg weekly.  Requesting new prescription. Sent to pharmacy of record today. Diet and nutrition discussed.      Relevant Medications   tirzepatide [facility]) 5 MG/0.5ML Pen   Patient Instructions  Rib Fracture  A rib fracture is a break or crack in one of the bones of the ribs. The ribs are like a cage that goes around your upper chest. A broken or cracked rib is often painful, but most do not cause other problems. Most rib fractures usually heal on their own in 1-3 months. What are the causes? Doing movements over and  over again with a lot of force, such as pitching a baseball or having a very bad cough. A direct hit to the  chest. Cancer that has spread to the bones. What are the signs or symptoms? Pain when you breathe in or cough. Pain when someone presses on the injured area. Feeling short of breath. How is this treated? Treatment depends on how bad the fracture is. In general: Most rib fractures usually heal on their own in 1-3 months. Healing may take longer if you have a cough or are doing activities that make the injury worse. While you heal, you may be given medicines to control pain. You will also be taught deep breathing exercises. Very bad injuries may require a stay at the hospital or surgery. Follow these instructions at home: Managing pain, stiffness, and swelling If told, put ice on the injured area. To do this: Put ice in a plastic bag. Place a towel between your skin and the bag. Leave the ice on for 20 minutes, 2-3 times a day. Take off the ice if your skin turns bright red. This is very important. If you cannot feel pain, heat, or cold, you have a greater risk of damage to the area. Take over-the-counter and prescription medicines only as told by your doctor. Activity Avoid activities that cause pain to the injured area. Protect your injured area. Slowly increase activity as told by your doctor. General instructions Do deep breathing exercises as told by your doctor. You may be told to: Take deep breaths many times a day. Cough several times a day while hugging a pillow. Use a device (incentive spirometer) to do deep breathing many times a day. Drink enough fluid to keep your pee (urine) clear or pale yellow. Do not wear a rib belt or binder. Keep all follow-up visits. Contact a doctor if: You have a fever. Get help right away if: You have trouble breathing. You are short of breath. You cannot stop coughing. You cough up thick or bloody spit. You feel like you may vomit (nauseous), vomit, or have belly (abdominal) pain. Your pain gets worse and medicine does not help. These  symptoms may be an emergency. Get help right away. Call your local emergency services (911 in the [place] Do not wait to see if the symptoms will go away. Do not drive yourself to the hospital. Summary A rib fracture is a break or crack in one of the bones of the ribs. Apply ice to the injured area and take medicines for pain as told by your doctor. Take deep breaths and cough several times a day. Hug a pillow every time you cough. This information is not intended to replace advice given to you by your health care provider. Make sure you discuss any questions you have with your health care provider. Document Revised: 04/19/2023 Document Reviewed: 04/19/2023 Elsevier Patient Education  2024 Elsevier Inc.   Edwina Barth, MD  Primary Care at [facility]

## 2023-07-28 NOTE — Assessment & Plan Note (Signed)
BP Readings from Last 3 Encounters:  07/28/23 (!) 130/90  07/25/23 107/80  05/05/23 (!) 130/90  Pain contributing to elevated reading Continue losartan 50 mg daily and follow-up with PCP

## 2023-07-28 NOTE — Patient Instructions (Signed)
Rib Fracture  A rib fracture is a break or crack in one of the bones of the ribs. The ribs are like a cage that goes around your upper chest. A broken or cracked rib is often painful, but most do not cause other problems. Most rib fractures usually heal on their own in 1-3 months. What are the causes? Doing movements over and over again with a lot of force, such as pitching a baseball or having a very bad cough. A direct hit to the chest. Cancer that has spread to the bones. What are the signs or symptoms? Pain when you breathe in or cough. Pain when someone presses on the injured area. Feeling short of breath. How is this treated? Treatment depends on how bad the fracture is. In general: Most rib fractures usually heal on their own in 1-3 months. Healing may take longer if you have a cough or are doing activities that make the injury worse. While you heal, you may be given medicines to control pain. You will also be taught deep breathing exercises. Very bad injuries may require a stay at the hospital or surgery. Follow these instructions at home: Managing pain, stiffness, and swelling If told, put ice on the injured area. To do this: Put ice in a plastic bag. Place a towel between your skin and the bag. Leave the ice on for 20 minutes, 2-3 times a day. Take off the ice if your skin turns bright red. This is very important. If you cannot feel pain, heat, or cold, you have a greater risk of damage to the area. Take over-the-counter and prescription medicines only as told by your doctor. Activity Avoid activities that cause pain to the injured area. Protect your injured area. Slowly increase activity as told by your doctor. General instructions Do deep breathing exercises as told by your doctor. You may be told to: Take deep breaths many times a day. Cough several times a day while hugging a pillow. Use a device (incentive spirometer) to do deep breathing many times a day. Drink  enough fluid to keep your pee (urine) clear or pale yellow. Do not wear a rib belt or binder. Keep all follow-up visits. Contact a doctor if: You have a fever. Get help right away if: You have trouble breathing. You are short of breath. You cannot stop coughing. You cough up thick or bloody spit. You feel like you may vomit (nauseous), vomit, or have belly (abdominal) pain. Your pain gets worse and medicine does not help. These symptoms may be an emergency. Get help right away. Call your local emergency services (911 in the [place] Do not wait to see if the symptoms will go away. Do not drive yourself to the hospital. Summary A rib fracture is a break or crack in one of the bones of the ribs. Apply ice to the injured area and take medicines for pain as told by your doctor. Take deep breaths and cough several times a day. Hug a pillow every time you cough. This information is not intended to replace advice given to you by your health care provider. Make sure you discuss any questions you have with your health care provider. Document Revised: 04/19/2023 Document Reviewed: 04/19/2023 Elsevier Patient Education  2024 [employer].

## 2023-07-28 NOTE — Assessment & Plan Note (Signed)
Nondisplaced fracture.  No complications. Pain management discussed. Using Norco at present time which is helping Needs work note. Work note provided as requested. Advised to follow-up with PCP in 2 to 3 weeks

## 2023-07-28 NOTE — Assessment & Plan Note (Signed)
Was started on Mounjaro but not tolerating 7.5 mg dose due to side effects.  Was doing well on 5 mg weekly.  Requesting new prescription. Sent to pharmacy of record today. Diet and nutrition discussed.

## 2023-07-30 ENCOUNTER — Telehealth: Payer: Self-pay

## 2023-07-30 NOTE — Telephone Encounter (Signed)
Paperwork received via fax and placed in provider's box. Patient informed that paperwork was received and given the 7-10 business day timeline.  Copied from CRM [id number]. Topic: General - Other >> Jul 30, 2023 10:50 AM Martinique E wrote: Reason for CRM: Patient called in regarding if a fax was sent over with his Disability paperwork. Looked under media tab and saw patient's after visit summaries from Dr. Alvy Bimler, but was not able to locate the fax. Callback number for patient is [phone] to confirm if you received the paperwork.

## 2023-07-30 NOTE — Telephone Encounter (Signed)
Paper work has not been received yet.

## 2023-08-05 ENCOUNTER — Telehealth: Payer: Self-pay | Admitting: Radiology

## 2023-08-05 NOTE — Telephone Encounter (Signed)
 Copied from CRM [id number]. Topic: Clinical - Medication Question >> Aug 04, 2023  4:50 PM Sonny Dandy B wrote: Reason for CRM: pt called to find out if his Disability paper work has been completed. Please call pt back at [phone]

## 2023-08-05 NOTE — Telephone Encounter (Signed)
 Copied from CRM [id number]. Topic: Clinical - Medication Question >> Aug 04, 2023  4:50 PM Robert Lloyd wrote: Reason for CRM: pt called to find out if his Disability paper work has been completed. Please call pt back at [phone]

## 2023-08-05 NOTE — Telephone Encounter (Signed)
 Placed on provider desk

## 2023-08-06 NOTE — Telephone Encounter (Signed)
 Copied from CRM [id number]. Topic: General - Other >> Aug 06, 2023  9:12 AM Pascal Lux wrote: Reason for CRM: Patient called regarding disability paperwork needs to be completed as soon as possible so he can get paid. Requesting a call back once we fax the paperwork and he would like a copy sent to his home address if possible.

## 2023-08-06 NOTE — Telephone Encounter (Signed)
 Forms have been given to Sagardia nurse, since he was seen by Dr.Sargardia for this issue.

## 2023-08-06 NOTE — Telephone Encounter (Signed)
 Forms placed in Dr. Darlin Drop office to be signed

## 2023-08-09 ENCOUNTER — Encounter: Payer: Self-pay | Admitting: Emergency Medicine

## 2023-09-22 ENCOUNTER — Ambulatory Visit: Admitting: Family Medicine

## 2023-11-03 ENCOUNTER — Other Ambulatory Visit: Payer: Self-pay

## 2023-11-03 ENCOUNTER — Emergency Department (HOSPITAL_COMMUNITY)

## 2023-11-03 ENCOUNTER — Emergency Department (HOSPITAL_COMMUNITY)
Admission: EM | Admit: 2023-11-03 | Discharge: 2023-11-03 | Disposition: A | Discharge status: Home / Self Care | Attending: Emergency Medicine | Admitting: Emergency Medicine

## 2023-11-03 ENCOUNTER — Encounter (HOSPITAL_COMMUNITY): Payer: Self-pay

## 2023-11-03 ENCOUNTER — Ambulatory Visit: Payer: Self-pay

## 2023-11-03 DIAGNOSIS — I493 Ventricular premature depolarization: Secondary | ICD-10-CM | POA: Diagnosis not present

## 2023-11-03 DIAGNOSIS — Z79899 Other long term (current) drug therapy: Secondary | ICD-10-CM | POA: Diagnosis not present

## 2023-11-03 DIAGNOSIS — I10 Essential (primary) hypertension: Secondary | ICD-10-CM | POA: Insufficient documentation

## 2023-11-03 DIAGNOSIS — R009 Unspecified abnormalities of heart beat: Secondary | ICD-10-CM | POA: Diagnosis present

## 2023-11-03 LAB — CBC WITH DIFFERENTIAL/PLATELET
Abs Immature Granulocytes: 0.04 10*3/uL (ref 0.00–0.07)
Basophils Absolute: 0.1 10*3/uL (ref 0.0–0.1)
Basophils Relative: 1 %
Eosinophils Absolute: 0.1 10*3/uL (ref 0.0–0.5)
Eosinophils Relative: 2 %
HCT: 41.3 % (ref 39.0–52.0)
Hemoglobin: 14.2 g/dL (ref 13.0–17.0)
Immature Granulocytes: 1 %
Lymphocytes Relative: 26 %
Lymphs Abs: 2.2 10*3/uL (ref 0.7–4.0)
MCH: 30.5 pg (ref 26.0–34.0)
MCHC: 34.4 g/dL (ref 30.0–36.0)
MCV: 88.6 fL (ref 80.0–100.0)
Monocytes Absolute: 0.9 10*3/uL (ref 0.1–1.0)
Monocytes Relative: 10 %
Neutro Abs: 5.3 10*3/uL (ref 1.7–7.7)
Neutrophils Relative %: 60 %
Platelets: 217 10*3/uL (ref 150–400)
RBC: 4.66 MIL/uL (ref 4.22–5.81)
RDW: 13.2 % (ref 11.5–15.5)
WBC: 8.6 10*3/uL (ref 4.0–10.5)
nRBC: 0 % (ref 0.0–0.2)

## 2023-11-03 LAB — COMPREHENSIVE METABOLIC PANEL WITH GFR
ALT: 24 U/L (ref 0–44)
AST: 22 U/L (ref 15–41)
Albumin: 4.2 g/dL (ref 3.5–5.0)
Alkaline Phosphatase: 72 U/L (ref 38–126)
Anion gap: 7 (ref 5–15)
BUN: 16 mg/dL (ref 6–20)
CO2: 26 mmol/L (ref 22–32)
Calcium: 9.3 mg/dL (ref 8.9–10.3)
Chloride: 101 mmol/L (ref 98–111)
Creatinine, Ser: 0.84 mg/dL (ref 0.61–1.24)
GFR, Estimated: >60 mL/min (ref >60)
Glucose, Bld: 98 mg/dL (ref 70–99)
Potassium: 3.8 mmol/L (ref 3.5–5.1)
Sodium: 134 mmol/L — ABNORMAL LOW (ref 135–145)
Total Bilirubin: 0.9 mg/dL (ref 0.0–1.2)
Total Protein: 7.6 g/dL (ref 6.5–8.1)

## 2023-11-03 LAB — MAGNESIUM: Magnesium: 2.3 mg/dL (ref 1.7–2.4)

## 2023-11-03 LAB — TSH: TSH: 1.215 u[IU]/mL (ref 0.350–4.500)

## 2023-11-03 LAB — TROPONIN I (HIGH SENSITIVITY): Troponin I (High Sensitivity): 3 ng/L (ref <18)

## 2023-11-03 NOTE — Telephone Encounter (Signed)
 Chief Complaint: bradycardia Symptoms: fatigue, nausea, fluctuating HR Frequency: worsening problem Pertinent Negatives: Patient denies CP currently, SOB, dizziness, vomiting, diaphoresis Disposition: [x] ED /[] Urgent Care (no appt availability in office) / [] Appointment(In office/virtual)/ []  [facility]/ [] Home Care/ [] Refused Recommended Disposition /[] [facility]/ []  Follow-up with PCP Additional Notes: Pt reports a fluctuating HR and bradycardia. Currently the pt states his HR is 91, but earlier today it was 39. 37 is the lowest it's been. Pt states he worked out at [employer] today and "my heart rate was fine", but it started fluctuating after he got home. Pt endorses fatigue and nausea without vomiting when his HR is low. Pt states the last two nights he went to bed with chest pain that he described as a cramping. Pt denies any chest pain right now. Pt denies dizziness, diaphoresis, vomiting, difficulty breathing. Pt is certainly concerned about his heart rate. Given periods of a HR <50 with fatigue and nausea RN advised pt he should go to the ED for evaluation. Pt agreeable to that plan. RN advised the pt if his CP returns or if he experiences a low HR again or difficulty breathing, dizziness, or any worsening at all he must call 911. Pt verbalized understanding.    Copied from CRM [id number]. Topic: Clinical - Red Word Triage >> Nov 03, 2023 11:20 AM Chasity T wrote: [facility] that prompted transfer to Nurse Triage: patient is having dropped heart rate for about a week. Current rate is 39 but is going up and down and has concerns if he needs to go to ER. Reason for Disposition  Heart beating very slowly (e.g., < 50 / minute)  (Exception: Athlete and heart rate normal for caller.)  Answer Assessment - Initial Assessment Questions 1. DESCRIPTION: "Please describe your heart rate or heartbeat that you are having" (e.g., fast/slow, regular/irregular, skipped or extra beats,  "palpitations")     "It goes low and then it shoots up. It goes from 39 to 35"; it's been as low as 37; when it happens it wipes me out, "when I get like this I fall asleep" 2. ONSET: "When did it start?" (Minutes, hours or days)      Has happened previously, worse now  3. DURATION: "How long does it last" (e.g., seconds, minutes, hours)     Up and down, worse for the last week  4. PATTERN "Does it come and go, or has it been constant since it started?"  "Does it get worse with exertion?"   "Are you feeling it now?"     Comes and goes 5. TAP: "Using your hand, can you tap out what you are feeling on a chair or table in front of you, so that I can hear?" (Note: not all patients can do this)       N/a 6. HEART RATE: "Can you tell me your heart rate?" "How many beats in 15 seconds?"  (Note: not all patients can do this)       Currently 91, but has been as low as 30 7. RECURRENT SYMPTOM: "Have you ever had this before?" If Yes, ask: "When was the last time?" and "What happened that time?"      Yes, but worsening 8. CAUSE: "What do you think is causing the palpitations?"     Not sure  9. CARDIAC HISTORY: "Do you have any history of heart disease?" (e.g., heart attack, angina, bypass surgery, angioplasty, arrhythmia)      HTN 10. OTHER SYMPTOMS: "  Do you have any other symptoms?" (e.g., dizziness, chest pain, sweating, difficulty breathing)       Pt saw cardiologist a couple yrs ago for dizzy spells. States he wore a heart monitor, they did not find anything. Denies dizziness currently. Pt states he feels nauseous when his HR drops. Pt states the last two nights his chest was hurting when he went to sleep. "Kind of like a cramp." Denies difficulty breathing. Denies diaphoresis. Pt states "sometimes I feel like I get a little clumsy, like I will catch myself stumbling." Pt states he went to the gym today and his "HR was fine."  Protocols used: Heart Rate and Heartbeat Questions-A-AH

## 2023-11-03 NOTE — Discharge Instructions (Signed)
 Please call the office of Dr. Londa Rival or the cardiologist at that office who can see you within the next week or so.  You will need to have an evaluation for the extra heartbeats, your blood work has been reassuring, your vital signs are normal, your EKG was reassuring, if your symptoms get worse go back to the ER immediately.

## 2023-11-03 NOTE — ED Triage Notes (Signed)
 Pt arrived via POV c/o irregular HR. Pt reports calling PCP office and being advised to seek evaluation in ER. Pt reports HR has been fluctuating between 38 BPM-122 BPM.

## 2023-11-03 NOTE — ED Provider Notes (Signed)
 [place] EMERGENCY DEPARTMENT AT [facility] Provider Note   CSN: [id number] Arrival date & time: 11/03/23  1202     History  Chief Complaint  Patient presents with   Irregular Heart Beat    Robert Lloyd is a 53 y.o. male.  HPI   This patient is a 52 year old male, history of high cholesterol and hypertension on losartan  presents with a complaint of feeling like his heart was beating too slow, he had a smart watch that told him it was in the 30s and 40s this morning, he was feeling generally fatigued when this occurred, he still went to the gym and was able to lift weights.  Took a nap, then decided to come in to be evaluated.  He has not taken his pulse but rather just use mechanical alternatives such as a smart watch and a blood pressure cuff at home which both measured intermittently bradycardic.  No coughing no shortness of breath no chest pain no headache no blurred vision no numbness or weakness, no edema of the legs.  The patient does drink an excessive amount of caffeine sometimes about 6 [employer] and a C4 energy drink every day  Home Medications Prior to Admission medications   Medication Sig Start Date End Date Taking? Authorizing Provider  Ascorbic Acid (VITAMIN C PO) Take by mouth.    [provider]  Cholecalciferol (VITAMIN D3 PO) Take by mouth.    [provider]  clonazePAM  (KLONOPIN ) 0.5 MG tablet TAKE 1 TABLET BY MOUTH TWICE A DAY AS NEEDED 06/23/23   Roslyn Coombe, MD  losartan  (COZAAR ) 50 MG tablet Take 1 tablet (50 mg total) by mouth daily. 03/24/23   Roslyn Coombe, MD  rosuvastatin  (CRESTOR ) 20 MG tablet TAKE 1 TABLET BY MOUTH EVERY OTHER DAY 12/03/22   Roslyn Coombe, MD  tirzepatide  (MOUNJARO ) 5 MG/0.5ML Pen Inject 5 mg into the skin once a week. 07/28/23   Elvira Hammersmith, MD  zinc gluconate 50 MG tablet Take 50 mg by mouth daily.    [provider]  zolpidem  (AMBIEN  CR) 12.5 MG CR tablet TAKE 1 TABLET (12.5 MG  TOTAL) BY MOUTH AT BEDTIME AS NEEDED. FOR SLEEP 07/22/23   Roslyn Coombe, MD      Allergies    Sulfonamide derivatives    Review of Systems   Review of Systems  All other systems reviewed and are negative.   Physical Exam Updated Vital Signs BP 115/63   Pulse 63   Temp 98.3 F (36.8 C) (Oral)   Resp 20   Ht 1.905 m (6\' 3" )   Wt 130 kg   SpO2 95%   BMI 35.82 kg/m  Physical Exam Vitals and nursing note reviewed.  Constitutional:      General: He is not in acute distress.    Appearance: He is well-developed.  HENT:     Head: Normocephalic and atraumatic.     Mouth/Throat:     Pharynx: No oropharyngeal exudate.  Eyes:     General: No scleral icterus.       Right eye: No discharge.        Left eye: No discharge.     Conjunctiva/sclera: Conjunctivae normal.     Pupils: Pupils are equal, round, and reactive to light.  Neck:     Thyroid : No thyromegaly.     Vascular: No JVD.  Cardiovascular:     Rate and Rhythm: Normal rate and regular rhythm.  Heart sounds: Normal heart sounds. No murmur heard.    No friction rub. No gallop.     Comments: Frequent PVCs Pulmonary:     Effort: Pulmonary effort is normal. No respiratory distress.     Breath sounds: Normal breath sounds. No wheezing or rales.  Abdominal:     General: Bowel sounds are normal. There is no distension.     Palpations: Abdomen is soft. There is no mass.     Tenderness: There is no abdominal tenderness.  Musculoskeletal:        General: No tenderness. Normal range of motion.     Cervical back: Normal range of motion and neck supple.  Lymphadenopathy:     Cervical: No cervical adenopathy.  Skin:    General: Skin is warm and dry.     Coloration: Pallor: .milm.     Findings: No erythema or rash.  Neurological:     General: No focal deficit present.     Mental Status: He is alert and oriented to person, place, and time.     Coordination: Coordination normal.  Psychiatric:        Behavior: Behavior  normal.     ED Results / Procedures / Treatments   Labs (all labs ordered are listed, but only abnormal results are displayed) Labs Reviewed  COMPREHENSIVE METABOLIC PANEL WITH GFR - Abnormal; Notable for the following components:      Result Value   Sodium 134 (*)    All other components within normal limits  CBC WITH DIFFERENTIAL/PLATELET  MAGNESIUM  TSH  TROPONIN I (HIGH SENSITIVITY)    EKG EKG Interpretation Date/Time:  Wednesday Nov 03 2023 12:42:25 EDT Ventricular Rate:  86 PR Interval:  124 QRS Duration:  76 QT Interval:  388 QTC Calculation: 464 R Axis:   28  Text Interpretation: Sinus rhythm with frequent Premature ventricular complexes Nonspecific T wave abnormality Abnormal ECG When compared with ECG of 25-Jul-2023 18:45, PREVIOUS ECG IS PRESENTPVC's now present Confirmed by Early Glisson ([id number]) on 11/03/2023 1:07:30 PM  Radiology DG Chest 2 View Result Date: 11/03/2023 CLINICAL DATA:  Chest pain EXAM: CHEST - 2 VIEW COMPARISON:  07/25/2023 FINDINGS: The heart size and mediastinal contours are within normal limits. Both lungs are clear. The visualized skeletal structures are unremarkable. IMPRESSION: No active cardiopulmonary disease. Electronically Signed   By: Rozell Cornet M.D.   On: 11/03/2023 15:01    Procedures Procedures    Medications Ordered in ED Medications - No data to display  ED Course/ Medical Decision Making/ A&P                                 Medical Decision Making Amount and/or Complexity of Data Reviewed Labs: ordered. Radiology: ordered.    This patient presents to the ED for concern of bradycardia, this involves an extensive number of treatment options, and is a complaint that carries with it a high risk of complications and morbidity.  The differential diagnosis includes ectopy, arrhythmia, electrolyte abnormalities, unlikely to be ischemia, patient otherwise appears well.  The patient was not checking his pulse clinically this  morning and does not know if this correlates with heart rate that the watch was giving him   Co morbidities that complicate the patient evaluation  Hide tension   Additional history obtained:  Additional history obtained from medical record External records from outside source obtained and reviewed including office visits with family doctor, sees  Dr. Autry Legions, no admissions to the hospital going back a couple of years. CT coronary was performed approximately 1 year ago, showed a coronary score of 24.3   Lab Tests:  I Ordered, and personally interpreted labs.  The pertinent results include: Magnesium slightly elevated, otherwise labs are unremarkable   Imaging Studies ordered:  I ordered imaging studies including 2 view PA and lateral chest x-ray I independently visualized and interpreted imaging which showed no findings I agree with the radiologist interpretation   Cardiac Monitoring: / EKG:  The patient was maintained on a cardiac monitor.  I personally viewed and interpreted the cardiac monitored which showed an underlying rhythm of: Kept on the monitor for the last couple of hours, occasional PVCs, no other acute arrhythmias or abnormalities, no signs of significant bradycardia and no hypotension   Problem List / ED Course / Critical interventions / Medication management  , As above, stable, no definite signs or sources of ectopy found, patient to follow-up in the outpatient setting, referred to local cardiology, the patient is agreeable.` I have reviewed the patients home medicines and have made adjustments as needed   Social Determinants of Health:  Caffeine use   Test / Admission - Considered:  Considered admission but no pathological rhythms seen         Final Clinical Impression(s) / ED Diagnoses Final diagnoses:  PVC's (premature ventricular contractions)    Rx / DC Orders ED Discharge Orders     None         Early Glisson, MD 11/03/23 [phone]

## 2023-11-04 ENCOUNTER — Telehealth: Payer: Self-pay

## 2023-11-04 NOTE — Transitions of Care (Post Inpatient/ED Visit) (Signed)
   11/04/2023  Name: Robert Lloyd MRN: [id number] DOB: 1971/05/28  Today's TOC FU Call Status: Today's TOC FU Call Status:: Unsuccessful Call (1st Attempt) Unsuccessful Call (1st Attempt) Date: 11/04/23  Attempted to reach the patient regarding the most recent Inpatient/ED visit.  Follow Up Plan: Additional outreach attempts will be made to reach the patient to complete the Transitions of Care (Post Inpatient/ED visit) call.   Signature

## 2023-11-09 ENCOUNTER — Ambulatory Visit: Admitting: Internal Medicine

## 2023-11-12 ENCOUNTER — Ambulatory Visit: Admitting: Internal Medicine

## 2023-12-15 ENCOUNTER — Other Ambulatory Visit: Payer: Self-pay

## 2023-12-15 ENCOUNTER — Other Ambulatory Visit: Payer: Self-pay | Admitting: Internal Medicine

## 2024-01-07 ENCOUNTER — Encounter: Payer: Self-pay | Admitting: Cardiology

## 2024-01-07 ENCOUNTER — Ambulatory Visit: Attending: Cardiology | Admitting: Cardiology

## 2024-01-07 VITALS — BP 118/84 | HR 85 | Ht 75.0 in | Wt 283.0 lb

## 2024-01-07 DIAGNOSIS — E782 Mixed hyperlipidemia: Secondary | ICD-10-CM

## 2024-01-07 DIAGNOSIS — R002 Palpitations: Secondary | ICD-10-CM | POA: Diagnosis not present

## 2024-01-07 DIAGNOSIS — I10 Essential (primary) hypertension: Secondary | ICD-10-CM

## 2024-01-07 DIAGNOSIS — I2510 Atherosclerotic heart disease of native coronary artery without angina pectoris: Secondary | ICD-10-CM | POA: Diagnosis not present

## 2024-01-07 DIAGNOSIS — R001 Bradycardia, unspecified: Secondary | ICD-10-CM | POA: Diagnosis not present

## 2024-01-07 DIAGNOSIS — R0683 Snoring: Secondary | ICD-10-CM

## 2024-01-07 NOTE — Progress Notes (Signed)
 Cardiology Office Note:  .   Date:  01/07/2024  ID:  Robert Lloyd, DOB July 28, 1970, MRN [id number] PCP: Norleen Lynwood ORN, MD  [facility] Providers Cardiologist:  Newman Lawrence, MD PCP: Norleen Lynwood ORN, MD  Chief Complaint  Patient presents with   Follow-up   Bradycardia     Robert Lloyd is a 53 y.o. 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  Recently, his Apple Watch alerted him that he was having heart rate less than 40 bpm lasting up to 10 minutes during the awake hours.  This was associated with mild nausea.  He went to the ER where any acute abnormality was ruled out.  His last episode of low heart rate was 3-4 weeks ago.  He does endorse snoring at night.  He was recommended sleep study in the past, but he did not undergo the same.  Otherwise, he denies any chest pain, shortness of breath.  He is compliant with his medical therapy.  He is current taking rosuvastatin  20 mg every other day.  Last lipid panel is from 03/2023.  He has upcoming appointment with a new PCP to be checked again.     Vitals:   01/07/24 0847  BP: 118/84  Pulse: 85  SpO2: 98%      Review of Systems  Cardiovascular:  Negative for chest pain, dyspnea on exertion, leg swelling, palpitations and syncope.        Studies Reviewed: SABRA         Labs December 02, 2023: Hb 14.2 Cr 0.8 Trop HS 3 TSH 1.2  03/2023: Chol 150, TG 121, HDL 39, LDL 86 HbA1C 5.9%  Coronary CTA December 02, 2022: 1. LAD coronary calcification with no significant obstructive CAD, CADRADS = 0.  2. Coronary artery calcium  score is 24.3, which places the patient in the 70th percentile for age/race and sex-matched control. 3. Normal coronary origin with right dominance.  4. Consider non-coronary causes of symptoms.  5. Recommend continued cardiac risk factor modification.     Stress test 02/2022:    Findings are consistent with no prior ischemia and no prior myocardial infarction. The study is low risk.   No ST deviation was noted.   LV perfusion is normal. There is no evidence of ischemia. There is no evidence of infarction.   Left ventricular function is abnormal. Global function is mildly reduced. There were no regional wall motion abnormalities. Nuclear stress EF: 43 %. The left ventricular ejection fraction is moderately decreased (30-44%). End diastolic cavity size is normal. End systolic cavity size is normal.   Prior study not available for comparison.   Normal perfusion no ischemia/infarct Frequent PVCls with stress and recovery. HTN  Response to exercise EF estimated at 43% Suggest correlation with TTE/MRI  Echocardiogram 03/2022:  1. Left ventricular ejection fraction, by estimation, is 50 to 55%. The  left ventricle has low normal function. The left ventricle has no regional  wall motion abnormalities. Left ventricular diastolic parameters were  normal.   2. Right ventricular systolic function is normal. The right ventricular  size is normal.   3. Left atrial size was mildly dilated.   4. The mitral valve is normal in structure. Trivial mitral valve  regurgitation. No evidence of mitral stenosis.   5. The aortic valve is normal in structure. Aortic valve regurgitation is  not visualized. No aortic stenosis is present.   6. The inferior  vena cava is normal in size with greater than 50%  respiratory variability, suggesting right atrial pressure of 3 mmHg.    Physical Exam Vitals and nursing note reviewed.  Constitutional:      General: He is not in acute distress. Neck:     Vascular: No JVD.  Cardiovascular:     Rate and Rhythm: Normal rate and regular rhythm.     Heart sounds: Normal heart sounds. No murmur heard. Pulmonary:     Effort: Pulmonary effort is normal.     Breath sounds: Normal breath sounds. No wheezing or rales.  Musculoskeletal:     Right lower  leg: No edema.     Left lower leg: No edema.      VISIT DIAGNOSES:   ICD-10-CM   1. Bradycardia  R00.1 Cardiac event monitor    2. Palpitations  R00.2     3. Coronary artery calcification  I25.10     4. Mixed hyperlipidemia  E78.2     5. Essential hypertension  I10     6. Snoring  R06.83        Robert Lloyd is a 53 y.o. male with hypertension, hyperlipidemia, prediabetes, obesity, coronary calcification, palpitations Assessment & Plan  Bradycardia:  Episodes of heart rate <40 bpm lasting for 10 minutes during the awake hours.  Associated symptoms include nausea, but no lightheadedness, presyncope or syncope.  I suspect he may have had PVCs that possibly showed low heart rate.  Recommend 30-day event monitor.  Patient would like to get this placed in September after his vacation.  Okay with me.  In addition, given his snoring, recommend sleep study.  Coronary calcification: No obstructive CAD noted on coronary CTA in 2024.  Goal LDL <70.  In addition to heart healthy diet and lifestyle, recommend rosuvastatin  20 mg 5 to 7 days a week as opposed to 3 days a week as he is currently doing.  He is tolerating this well without myalgias.  Encouraged him to take it more frequently.  If LDL remains <70, stop may increase to 40 mg daily.  Hypertension: Well-controlled  Obesity: Currently on Mounjaro .  Recommend regular physical activity and heart healthy calorie negative diet.  F/u in 05/2024 w/APP  Signed, Newman JINNY Lawrence, MD

## 2024-01-07 NOTE — Patient Instructions (Addendum)
 Testing/Procedures: 30 day event monitor ([place])  Your physician has recommended that you wear an event monitor. Event monitors are medical devices that record the heart's electrical activity. Doctors most often us  these monitors to diagnose arrhythmias. Arrhythmias are problems with the speed or rhythm of the heartbeat. The monitor is a small, portable device. You can wear one while you do your normal daily activities. This is usually used to diagnose what is causing palpitations/syncope (passing out).  REFERRAL FOR SLEEP STUDY   Your physician has recommended that you have a sleep study. This test records several body functions during sleep, including: brain activity, eye movement, oxygen and carbon dioxide blood levels, heart rate and rhythm, breathing rate and rhythm, the flow of air through your mouth and nose, snoring, body muscle movements, and chest and belly movement.    Follow-Up: At [facility], you and your health needs are our priority.  As part of our continuing mission to provide you with exceptional heart care, our providers are all part of one team.  This team includes your primary Cardiologist (physician) and Advanced Practice Providers or APPs (Physician Assistants and Nurse Practitioners) who all work together to provide you with the care you need, when you need it.  Your next appointment:   05/2024   Provider:   One of our Advanced Practice Providers (APPs): Morse Clause, PA-C  Lamarr Satterfield, NP Miriam Shams, NP  Olivia Pavy, PA-C Josefa Beauvais, NP  Leontine Salen, PA-C Orren Fabry, PA-C  [place], [place] Jackee Alberts, NP  Damien Braver, NP Jon Hails, PA-C  Waddell Donath, PA-C    Dayna Dunn, PA-C  Glendia Ferrier, PA-C Lum Louis, NP Katlyn West, NP Callie Goodrich, PA-C  Evan Williams, PA-C Sheng Haley, PA-C  Xika Zhao, NP Kathleen Johnson, PA-C

## 2024-02-03 ENCOUNTER — Encounter: Payer: Self-pay | Admitting: Neurology

## 2024-02-03 ENCOUNTER — Ambulatory Visit (INDEPENDENT_AMBULATORY_CARE_PROVIDER_SITE_OTHER): Admitting: Neurology

## 2024-02-03 VITALS — BP 120/78 | HR 80 | Ht 75.0 in | Wt 292.0 lb

## 2024-02-03 DIAGNOSIS — R0683 Snoring: Secondary | ICD-10-CM | POA: Diagnosis not present

## 2024-02-03 DIAGNOSIS — Z6836 Body mass index (BMI) 36.0-36.9, adult: Secondary | ICD-10-CM

## 2024-02-03 DIAGNOSIS — F518 Other sleep disorders not due to a substance or known physiological condition: Secondary | ICD-10-CM | POA: Insufficient documentation

## 2024-02-03 DIAGNOSIS — Z9189 Other specified personal risk factors, not elsewhere classified: Secondary | ICD-10-CM | POA: Diagnosis not present

## 2024-02-03 DIAGNOSIS — Z72820 Sleep deprivation: Secondary | ICD-10-CM | POA: Diagnosis not present

## 2024-02-03 DIAGNOSIS — R002 Palpitations: Secondary | ICD-10-CM | POA: Diagnosis not present

## 2024-02-03 DIAGNOSIS — E6609 Other obesity due to excess calories: Secondary | ICD-10-CM

## 2024-02-03 DIAGNOSIS — E66812 Obesity, class 2: Secondary | ICD-10-CM

## 2024-02-03 DIAGNOSIS — G2581 Restless legs syndrome: Secondary | ICD-10-CM | POA: Insufficient documentation

## 2024-02-03 NOTE — Progress Notes (Signed)
 SLEEP MEDICINE CLINIC    Provider:  Dedra Gores, MD  Primary Care Physician:  Chinita Hoy CROME, PA-C [address] [place] [address]     Referring Provider: Elmira Newman PARAS, Md [address] [place],  [place] [address]          Chief Complaint according to patient   Patient presents with:     New Patient (Initial Visit)           HISTORY OF PRESENT ILLNESS:  Robert Lloyd is a 53 y.o. male patient who is seen upon Dr Gilda referral- Cardiology referral on 02/03/2024  for a Sleep apnea evaluated.  Chief concern according to patient :   my fraternal twin has sleep apnea and was tired all the time, now on CPAP he is doing well. I may have the same problem. I have regular heart beats ( skipps beats)  and heart rates drop to 40 bpm, and Dr. SHAUNNA felt this may all be apnea related. My sleep is very broken up and a heart monitor is pending.  .      Sleep relevant medical history: Nocturia 0-1 / , Sleep walking in childhood, no Tonsillectomy but wisdom teeth removed, no TBI, no whiplash hx, pre diabetic, obese.  Abdominal pain.     Family medical /sleep history: brother on CPAP with OSA.   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 12 hours - daytime,  and lives in a household with spouse, no children,  3 snakes.  Tobacco use: nicotine .  ETOH use ;  occasionally, going out-  Caffeine intake in form of Coffee( iced coffee with protein shake , 3 or 4 a week) Soda( mountain dew 2 cans ) Tea ( /)  and sometimes C4 energy drinks. Exercise in form of - weight lifting , less than in the past,  walking .   Swimming ( has his own pool)        Sleep habits are as follows: The patient's dinner time is between 8 PM. The patient goes to bed at 10 PM and continues to sleep for intervals of 1-2 hours, wakes  not for  bathroom breaks, but feels restless and has  palpitations- sometimes clammy.   The preferred sleep position is variable - he has trouble to find comfort, supine sleep comes with loud snoring , with the support of 2 pillows. Dreams are reportedly frequent/vivid.   The patient wakes up spontaneously before the alarm  at 4.40 AM . 4.50   AM is the usual rise time. He reports not feeling refreshed or restored in AM, with symptoms such as dry mouth, some morning headaches, and residual fatigue.  Naps are taken frequently, he can fall asleep immediately  can't fight it in front of the TV ,-  lasting from 10 to 20 minutes and are more refreshing .    Review of Systems: Out of a complete 14 system review, the patient complains of only the following symptoms, and all other reviewed systems are negative.:  Fatigue, sleepiness , snoring, fragmented sleep,   Some days he can't get to sleep- many times he can sleep at the drop of a head.   Mind racing.    How likely are you to doze in the following situations: 0 = not likely, 1 = slight chance, 2 = moderate chance, 3 = high  chance   Sitting and Reading? Watching Television? Sitting inactive in a public place (theater or meeting)? As a passenger in a car for an hour without a break? Lying down in the afternoon when circumstances permit? Sitting and talking to someone? Sitting quietly after lunch without alcohol? In a car, while stopped for a few minutes in traffic?   Total = 14/ 24 points   FSS endorsed at 46/ 63 points.   Social History   Socioeconomic History   Marital status: Married    Spouse name: n/a   Number of children: 0   Years of education: 12+   Highest education level: 12th grade  Occupational History   Occupation: Research scientist (medical)    Comment: [employer]  Tobacco Use   Smoking status: Never    Passive exposure: Never   Smokeless tobacco: Never  Vaping Use   Vaping status: Never Used  Substance and Sexual Activity   Alcohol use: Yes    Alcohol/week: 0.0  standard drinks of alcohol    Comment: occasionally   Drug use: No   Sexual activity: Yes  Other Topics Concern   Not on file  Social History Narrative   Lives alone. Twin brother lives next door with his wife and son.   Social Drivers of Corporate investment banker Strain: Low Risk  (01/08/2024)   Received from [facility]   Overall Financial Resource Strain (CARDIA)    How hard is it for you to pay for the very basics like food, housing, medical care, and heating?: Not very hard  Food Insecurity: No Food Insecurity (01/08/2024)   Received from [facility]   Hunger Vital Sign    Within the past 12 months, you worried that your food would run out before you got the money to buy more.: Never true    Within the past 12 months, the food you bought just didn't last and you didn't have money to get more.: Never true  Transportation Needs: No Transportation Needs (01/08/2024)   Received from [facility] - Transportation    In the past 12 months, has lack of transportation kept you from medical appointments or from getting medications?: No    In the past 12 months, has lack of transportation kept you from meetings, work, or from getting things needed for daily living?: No  Physical Activity: Insufficiently Active (01/08/2024)   Received from [facility]   Exercise Vital Sign    On average, how many days per week do you engage in moderate to strenuous exercise (like a brisk walk)?: 2 days    On average, how many minutes do you engage in exercise at this level?: 20 min  Stress: No Stress Concern Present (01/08/2024)   Received from [facility] - Occupational Stress Questionnaire    Do you feel stress - tense, restless, nervous, or anxious, or unable to sleep at night because your mind is troubled all the time - these days?: Only a little  Social Connections: Moderately Integrated (01/08/2024)   Received from [facility]   Social  Network    How would you rate your social network (family, work, friends)?: Adequate participation with social networks    Family History  Problem Relation Age of Onset   Hypertension Mother    Colon polyps Brother    Colon cancer Neg Hx    Esophageal cancer Neg Hx    Rectal cancer Neg Hx  Stomach cancer Neg Hx     Past Medical History:  Diagnosis Date   Anxiety state, unspecified 11/09/2012   DEPRESSION 03/31/2007   Qualifier: Diagnosis of  By: Norleen MD, Lynwood ORN    HYPERLIPIDEMIA 03/31/2007   Qualifier: Diagnosis of  By: Norleen MD, Lynwood ORN    HYPERTENSION 04/10/2008   Qualifier: Diagnosis of  By: Kassie MD, Sean A    Obesity     Past Surgical History:  Procedure Laterality Date   HERNIA REPAIR     infancy   LITHOTRIPSY     rotater cuff repair Right      Current Outpatient Medications on File Prior to Visit  Medication Sig Dispense Refill   Ascorbic Acid (VITAMIN C PO) Take by mouth.     busPIRone (BUSPAR) 10 MG tablet Take 10 mg by mouth 2 (two) times daily as needed (anxiety).     Cholecalciferol (VITAMIN D3 PO) Take by mouth.     losartan  (COZAAR ) 50 MG tablet Take 1 tablet (50 mg total) by mouth daily. 90 tablet 3   rosuvastatin  (CRESTOR ) 20 MG tablet TAKE 1 TABLET BY MOUTH EVERY OTHER DAY 45 tablet 3   traZODone (DESYREL) 50 MG tablet Take 50-100 mg by mouth at bedtime as needed for sleep.     zinc gluconate 50 MG tablet Take 50 mg by mouth daily.     tirzepatide  (MOUNJARO ) 5 MG/0.5ML Pen Inject 5 mg into the skin once a week. (Patient not taking: Reported on 02/03/2024) 6 mL 3   No current facility-administered medications on file prior to visit.    Allergies  Allergen Reactions   Sulfonamide Derivatives Other (See Comments)    Unknown      DIAGNOSTIC DATA (LABS, IMAGING, TESTING) - I reviewed patient records, labs, notes, testing and imaging myself where available.  Lab Results  Component Value Date   WBC 8.6 11/03/2023   HGB 14.2 11/03/2023   HCT  41.3 11/03/2023   MCV 88.6 11/03/2023   PLT 217 11/03/2023      Component Value Date/Time   NA 134 (L) 11/03/2023 1412   NA 139 10/26/2022 0919   K 3.8 11/03/2023 1412   CL 101 11/03/2023 1412   CO2 26 11/03/2023 1412   GLUCOSE 98 11/03/2023 1412   BUN 16 11/03/2023 1412   BUN 16 10/26/2022 0919   CREATININE 0.84 11/03/2023 1412   CREATININE 0.76 12/29/2019 0856   CALCIUM  9.3 11/03/2023 1412   PROT 7.6 11/03/2023 1412   ALBUMIN 4.2 11/03/2023 1412   AST 22 11/03/2023 1412   ALT 24 11/03/2023 1412   ALKPHOS 72 11/03/2023 1412   BILITOT 0.9 11/03/2023 1412   GFRNONAA >60 11/03/2023 1412   GFRAA >90 12/14/2012 0236   Lab Results  Component Value Date   CHOL 150 03/18/2023   HDL 39.90 03/18/2023   LDLCALC 86 03/18/2023   LDLDIRECT 160.0 03/15/2018   TRIG 121.0 03/18/2023   CHOLHDL 4 03/18/2023   Lab Results  Component Value Date   HGBA1C 5.9 03/18/2023   Lab Results  Component Value Date   VITAMINB12 563 03/18/2023   Lab Results  Component Value Date   TSH 1.215 11/03/2023    PHYSICAL EXAM:  Today's Vitals   02/03/24 0841  BP: 120/78  Pulse: 80  Weight: 292 lb (132.5 kg)  Height: 6' 3 (1.905 m)   Body mass index is 36.5 kg/m.   Wt Readings from Last 3 Encounters:  02/03/24 292 lb (132.5 kg)  01/07/24  283 lb (128.4 kg)  11/03/23 286 lb 9.6 oz (130 kg)     Ht Readings from Last 3 Encounters:  02/03/24 6' 3 (1.905 m)  01/07/24 6' 3 (1.905 m)  11/03/23 6' 3 (1.905 m)      General: The patient is awake, alert but appears tired  The patient is well groomed. Head: Normocephalic, atraumatic. Neck is supple.  Mallampati 3,  neck circumference:18 inches / 45 cm . Nasal airflow  patent.  Retrognathia is not  seen.  Dental status: wore retainers, irregular  teeth  Cardiovascular:  Regular rate and cardiac rhythm by pulse,  without distended neck veins. Respiratory: Lungs are clear to auscultation.  Skin:  Without evidence of ankle edema, or  rash. Trunk: The patient's posture is erect.   NEUROLOGIC EXAM: The patient is awake and alert, oriented to place and time.   Memory subjective described as intact.  Attention span & concentration ability appears normal.  Speech is fluent,  without  dysarthria, dysphonia or aphasia.  Mood and affect are appropriate.   Cranial nerves: no loss of smell or taste reported  Pupils are equal and briskly reactive to light. Funduscopic exam def.  Extraocular movements in vertical and horizontal planes were intact and without nystagmus. No Diplopia. Visual fields by finger perimetry are intact. Hearing was intact to tuning fork. .    Facial sensation intact to fine touch.  Facial motor strength is symmetric and tongue and uvula move midline.  Neck ROM : rotation, tilt and flexion extension were normal for age and shoulder shrug was symmetrical.    Motor exam:  Symmetric bulk, tone and ROM.   Normal tone without cog- wheeling, symmetric grip strength .   Sensory:   vibration :  normal.  Proprioception tested in the upper extremities was normal.   Coordination: Rapid alternating movements in the fingers/hands were of normal speed.  The Finger-to-nose maneuver was intact without evidence of ataxia, dysmetria or tremor.   Gait and station: Patient could rise unassisted from a seated position, walked without assistive device.   Toe and heel walk were deferred.  Deep tendon reflexes: in the  upper and lower extremities are symmetric and intact.      ASSESSMENT AND PLAN 53 y.o. year old male  here with:    1) nocturnal palpitations, snoring , likely apnea. Risk factor for OSA  are present: Obesity - BMI variable for years, now at 36.5, neck size 18 inches.   2) excessive daytime sleepiness , high fatigue, vivid dreams, fragmented sleep.   3) RLS , GERD,  short time between dinner and sleep time.   I plan to follow up either personally or through our NP within 4-5 months.   I would like  to thank Chinita Hoy LITTIE DEVONNA and Elmira Newman PARAS, Md [address] [place],  [place] [address] for allowing me to meet with and to take care of this pleasant patient.   Discussion of sleep hygiene setting bedtime and rise time,  hot shower  before bed time, no screen light in the bedroom, the bedroom should be cool, quiet and dark. Night lights should illuminate the floor not shine into your eyes. Golden glow  light is less intrusive than blue or cold light.  Read in a book with pages, not on a device. Consider audio books and soothing  sound -scapes.    After spending a total time of  45  minutes face to face and additional time for physical and  neurologic examination, review of laboratory studies,  personal review of imaging studies, reports and results of other testing and review of referral information / records as far as provided in visit,   Electronically signed by: Dedra Gores, MD 02/03/2024 9:17 AM  [facility] and [facility] Board certified by The [employer] of Sleep Medicine and Diplomate of the [employer] of Sleep Medicine. Board certified In Neurology through the ABPN, Fellow of the [employer] of Neurology.

## 2024-02-03 NOTE — Patient Instructions (Addendum)
 ASSESSMENT AND PLAN 53 y.o. year old male  here with:     1) nocturnal palpitations, snoring , likely apnea. Risk factor for OSA  are present: Obesity - BMI variable for years, now at 36.5, neck size 18 inches.    2) excessive daytime sleepiness , high fatigue, vivid dreams, fragmented sleep.    3) RLS , GERD,  short time between dinner and sleep time.     HST ordered.  I plan to follow up either personally or through our NP within 4-5 months.      Living With Sleep Apnea Sleep apnea is a condition that affects your breathing while you're sleeping. Your tongue or the tissue in your throat may block the flow of air while you sleep. You may have shallow breathing or stop breathing for short periods of time. The breaks in breathing interrupt the deep sleep that you need to feel rested. Even if you don't wake up from the gaps in breathing, you may feel tired during the day. People with sleep apnea may snore loudly. You may have a headache in the morning and feel anxious or depressed. How can sleep apnea affect me? Sleep apnea increases your chances of being very tired during the day. This is called daytime fatigue. Sleep apnea can also increase your risk of: Heart attack. Stroke. Obesity. Type 2 diabetes. Heart failure. Irregular heartbeat. High blood pressure. If you are very tired during the day, you may be more likely to: Not do well in school or at work. Fall asleep while driving. Have trouble paying attention. Develop depression or anxiety. Have problems having sex. This is called sexual dysfunction. What actions can I take to manage sleep apnea? Sleep apnea treatment  If you were given a device to open your airway while you sleep, use it only as told by your health care provider. You may be given: An oral appliance. This is a mouthpiece that shifts your lower jaw forward. A continuous positive airway pressure (CPAP) device. This blows air through a mask. A nasal expiratory  positive airway pressure (EPAP) device. This has valves that you put into each nostril. A bi-level positive airway pressure (BIPAP) device. This blows air through a mask when you breathe in and breathe out. You may need surgery if other treatments don't work for you. Sleep habits Go to sleep and wake up at the same time every day. This helps set your internal clock for sleeping. If you stay up later than usual on weekends, try to get up in the morning within 2 hours of the time you usually wake up. Try to get at least 7-9 hours of sleep each night. Stop using a computer, tablet, and mobile phone a few hours before bedtime. Do not take long naps during the day. If you nap, limit it to 30 minutes. Have a relaxing bedtime routine. Reading or listening to music may relax you and help you sleep. Use your bedroom only for sleep. Keep your television and computer out of your bedroom. Keep your bedroom cool, dark, and quiet. Use a supportive mattress and pillows. Follow your provider's instructions for other changes to sleep habits. Nutrition Do not eat big meals in the evening. Do not have caffeine in the later part of the day. The effects of caffeine can last for more than 5 hours. Follow your provider's instructions for any changes to what you eat and drink. Lifestyle Do not drink alcohol before bedtime. Alcohol can cause you to fall asleep at  first, but then it can cause you to wake up in the middle of the night and have trouble getting back to sleep. Do not smoke, vape, or use nicotine or tobacco. Medicines Take over-the-counter and prescription medicines only as told by your provider. Do not use over-the-counter sleep medicine. You may become dependent on this medicine, and it can make sleep apnea worse. Do not take medicines, such as sedatives and narcotics, unless told to by your provider. Activity Exercise on most days, but avoid exercising in the evening. Exercising near bedtime can  interfere with sleeping. If possible, spend time outside every day. Natural light helps with your internal clock. General information Lose weight if you need to. Stay at a healthy weight. If you are having surgery, make sure to tell your provider that you have sleep apnea. You may need to bring your device with you. Keep all follow-up visits. Your provider will want to check on your condition. Where to find more information National Heart, Lung, and Blood Institute: [employer] This information is not intended to replace advice given to you by your health care provider. Make sure you discuss any questions you have with your health care provider. Document Revised: 09/23/2022 Document Reviewed: 09/23/2022 Elsevier Patient Education  2024 [employer].

## 2024-03-06 ENCOUNTER — Telehealth: Payer: Self-pay | Admitting: Neurology

## 2024-03-06 NOTE — Telephone Encounter (Signed)
 Patient called back due to no one has returned phone to cancel appointment. Have cancelled appointment, patient would like a call back to reschedule appointment

## 2024-03-06 NOTE — Telephone Encounter (Signed)
 Patient called to reschedule HST due to have a cold and can not sleep for coughing through the nigh. Transferred call to Sleep Lab Susette)

## 2024-03-07 ENCOUNTER — Encounter

## 2024-03-09 ENCOUNTER — Telehealth: Payer: Self-pay

## 2024-03-09 NOTE — Telephone Encounter (Signed)
 Pharmacy Patient Advocate Encounter  Received notification from [employer] that Prior Authorization for MOUNJARO  AUTO-INJECTORS has been APPROVED from 03/08/24 to 03/08/25   PA #/Case ID/Reference #: [id number]

## 2024-03-10 ENCOUNTER — Other Ambulatory Visit: Payer: Self-pay | Admitting: Internal Medicine

## 2024-07-12 ENCOUNTER — Telehealth: Payer: Self-pay

## 2024-07-12 NOTE — Telephone Encounter (Signed)
 Contacted patient to follow up on heart monitor and to try to get him scheduled per Dr. Gilda follow up plan. Pt declined at this time and will contact us  to follow up when ready to see us  again.

## 2024-07-13 NOTE — Telephone Encounter (Signed)
 Noted.  Thanks MJP
